# Patient Record
Sex: Male | Born: 1935 | Race: White | Hispanic: No | Marital: Married | Smoking: Never smoker
Health system: Southern US, Community
[De-identification: ages and names within clinical notes are randomized; demographics above are authoritative.]

## PROBLEM LIST (undated history)

## (undated) DIAGNOSIS — K922 Gastrointestinal hemorrhage, unspecified: Secondary | ICD-10-CM

## (undated) DIAGNOSIS — I639 Cerebral infarction, unspecified: Secondary | ICD-10-CM

## (undated) DIAGNOSIS — I10 Essential (primary) hypertension: Secondary | ICD-10-CM

## (undated) DIAGNOSIS — N400 Enlarged prostate without lower urinary tract symptoms: Secondary | ICD-10-CM

## (undated) DIAGNOSIS — I6529 Occlusion and stenosis of unspecified carotid artery: Secondary | ICD-10-CM

## (undated) DIAGNOSIS — R569 Unspecified convulsions: Secondary | ICD-10-CM

## (undated) DIAGNOSIS — I350 Nonrheumatic aortic (valve) stenosis: Secondary | ICD-10-CM

## (undated) DIAGNOSIS — T7840XA Allergy, unspecified, initial encounter: Secondary | ICD-10-CM

## (undated) DIAGNOSIS — G4733 Obstructive sleep apnea (adult) (pediatric): Secondary | ICD-10-CM

## (undated) DIAGNOSIS — I2510 Atherosclerotic heart disease of native coronary artery without angina pectoris: Secondary | ICD-10-CM

## (undated) DIAGNOSIS — G500 Trigeminal neuralgia: Secondary | ICD-10-CM

## (undated) DIAGNOSIS — N289 Disorder of kidney and ureter, unspecified: Secondary | ICD-10-CM

## (undated) DIAGNOSIS — H9190 Unspecified hearing loss, unspecified ear: Secondary | ICD-10-CM

## (undated) DIAGNOSIS — F329 Major depressive disorder, single episode, unspecified: Secondary | ICD-10-CM

## (undated) DIAGNOSIS — F32A Depression, unspecified: Secondary | ICD-10-CM

## (undated) DIAGNOSIS — I719 Aortic aneurysm of unspecified site, without rupture: Secondary | ICD-10-CM

## (undated) DIAGNOSIS — IMO0002 Reserved for concepts with insufficient information to code with codable children: Secondary | ICD-10-CM

## (undated) DIAGNOSIS — E785 Hyperlipidemia, unspecified: Secondary | ICD-10-CM

## (undated) HISTORY — DX: Allergy, unspecified, initial encounter: T78.40XA

---

## 1998-02-25 ENCOUNTER — Ambulatory Visit (HOSPITAL_COMMUNITY): Admission: RE | Admit: 1998-02-25 | Discharge: 1998-02-25 | Payer: Self-pay | Admitting: *Deleted

## 1998-04-22 ENCOUNTER — Ambulatory Visit (HOSPITAL_COMMUNITY): Admission: RE | Admit: 1998-04-22 | Discharge: 1998-04-22 | Payer: Self-pay

## 1998-11-26 ENCOUNTER — Observation Stay (HOSPITAL_COMMUNITY): Admission: AD | Admit: 1998-11-26 | Discharge: 1998-11-27 | Payer: Self-pay | Admitting: Cardiology

## 1999-04-10 ENCOUNTER — Ambulatory Visit (HOSPITAL_COMMUNITY): Admission: RE | Admit: 1999-04-10 | Discharge: 1999-04-10 | Payer: Self-pay

## 1999-04-29 ENCOUNTER — Encounter: Admission: RE | Admit: 1999-04-29 | Discharge: 1999-04-29 | Payer: Self-pay | Admitting: Internal Medicine

## 1999-04-29 ENCOUNTER — Encounter: Payer: Self-pay | Admitting: Internal Medicine

## 1999-09-22 ENCOUNTER — Other Ambulatory Visit: Admission: RE | Admit: 1999-09-22 | Discharge: 1999-09-22 | Payer: Self-pay | Admitting: Urology

## 1999-10-30 ENCOUNTER — Ambulatory Visit (HOSPITAL_COMMUNITY): Admission: RE | Admit: 1999-10-30 | Discharge: 1999-10-30 | Payer: Self-pay | Admitting: *Deleted

## 2000-01-10 ENCOUNTER — Ambulatory Visit (HOSPITAL_BASED_OUTPATIENT_CLINIC_OR_DEPARTMENT_OTHER): Admission: RE | Admit: 2000-01-10 | Discharge: 2000-01-10 | Payer: Self-pay | Admitting: Pulmonary Disease

## 2000-05-31 HISTORY — PX: CORONARY ARTERY BYPASS GRAFT: SHX141

## 2000-08-19 ENCOUNTER — Encounter: Payer: Self-pay | Admitting: Emergency Medicine

## 2000-08-19 ENCOUNTER — Inpatient Hospital Stay (HOSPITAL_COMMUNITY): Admission: EM | Admit: 2000-08-19 | Discharge: 2000-08-29 | Payer: Self-pay | Admitting: Emergency Medicine

## 2000-08-23 ENCOUNTER — Encounter: Payer: Self-pay | Admitting: Internal Medicine

## 2000-08-24 ENCOUNTER — Encounter: Payer: Self-pay | Admitting: Cardiothoracic Surgery

## 2000-08-25 ENCOUNTER — Encounter: Payer: Self-pay | Admitting: Cardiothoracic Surgery

## 2000-08-26 ENCOUNTER — Encounter: Payer: Self-pay | Admitting: Cardiothoracic Surgery

## 2000-08-27 ENCOUNTER — Encounter: Payer: Self-pay | Admitting: Cardiothoracic Surgery

## 2000-08-28 ENCOUNTER — Encounter: Payer: Self-pay | Admitting: Thoracic Surgery (Cardiothoracic Vascular Surgery)

## 2000-10-28 ENCOUNTER — Encounter (HOSPITAL_COMMUNITY): Admission: RE | Admit: 2000-10-28 | Discharge: 2001-01-26 | Payer: Self-pay | Admitting: Cardiovascular Disease

## 2000-11-26 ENCOUNTER — Inpatient Hospital Stay (HOSPITAL_COMMUNITY): Admission: EM | Admit: 2000-11-26 | Discharge: 2000-11-29 | Payer: Self-pay | Admitting: Emergency Medicine

## 2000-11-26 ENCOUNTER — Encounter: Payer: Self-pay | Admitting: *Deleted

## 2002-05-31 HISTORY — PX: TEMPORAL ARTERY BIOPSY / LIGATION: SUR132

## 2002-07-16 ENCOUNTER — Encounter: Payer: Self-pay | Admitting: Internal Medicine

## 2002-07-16 ENCOUNTER — Encounter: Admission: RE | Admit: 2002-07-16 | Discharge: 2002-07-16 | Payer: Self-pay | Admitting: Internal Medicine

## 2002-07-18 ENCOUNTER — Encounter: Admission: RE | Admit: 2002-07-18 | Discharge: 2002-07-18 | Payer: Self-pay | Admitting: Internal Medicine

## 2002-07-18 ENCOUNTER — Encounter: Payer: Self-pay | Admitting: Internal Medicine

## 2002-07-24 ENCOUNTER — Ambulatory Visit (HOSPITAL_COMMUNITY): Admission: RE | Admit: 2002-07-24 | Discharge: 2002-07-24 | Payer: Self-pay | Admitting: Internal Medicine

## 2002-07-24 ENCOUNTER — Encounter: Payer: Self-pay | Admitting: Internal Medicine

## 2002-08-13 ENCOUNTER — Ambulatory Visit (HOSPITAL_COMMUNITY): Admission: RE | Admit: 2002-08-13 | Discharge: 2002-08-13 | Payer: Self-pay | Admitting: Surgery

## 2002-08-13 ENCOUNTER — Encounter (INDEPENDENT_AMBULATORY_CARE_PROVIDER_SITE_OTHER): Payer: Self-pay | Admitting: Specialist

## 2004-04-11 ENCOUNTER — Inpatient Hospital Stay (HOSPITAL_COMMUNITY): Admission: RE | Admit: 2004-04-11 | Discharge: 2004-04-15 | Payer: Self-pay | Admitting: *Deleted

## 2005-02-08 ENCOUNTER — Observation Stay (HOSPITAL_COMMUNITY): Admission: EM | Admit: 2005-02-08 | Discharge: 2005-02-09 | Payer: Self-pay | Admitting: Emergency Medicine

## 2005-11-29 ENCOUNTER — Observation Stay (HOSPITAL_COMMUNITY): Admission: EM | Admit: 2005-11-29 | Discharge: 2005-11-30 | Payer: Self-pay | Admitting: Emergency Medicine

## 2005-11-30 ENCOUNTER — Encounter (INDEPENDENT_AMBULATORY_CARE_PROVIDER_SITE_OTHER): Payer: Self-pay | Admitting: Cardiology

## 2006-03-24 ENCOUNTER — Encounter: Payer: Self-pay | Admitting: Vascular Surgery

## 2006-03-24 ENCOUNTER — Ambulatory Visit (HOSPITAL_COMMUNITY): Admission: RE | Admit: 2006-03-24 | Discharge: 2006-03-24 | Payer: Self-pay | Admitting: Cardiology

## 2007-03-31 ENCOUNTER — Encounter: Admission: RE | Admit: 2007-03-31 | Discharge: 2007-03-31 | Payer: Self-pay | Admitting: Internal Medicine

## 2008-01-15 ENCOUNTER — Observation Stay (HOSPITAL_COMMUNITY): Admission: EM | Admit: 2008-01-15 | Discharge: 2008-01-16 | Payer: Self-pay | Admitting: Emergency Medicine

## 2008-01-29 ENCOUNTER — Encounter: Admission: RE | Admit: 2008-01-29 | Discharge: 2008-01-29 | Payer: Self-pay | Admitting: Orthopedic Surgery

## 2008-08-21 ENCOUNTER — Ambulatory Visit: Payer: Self-pay | Admitting: Vascular Surgery

## 2008-08-21 ENCOUNTER — Observation Stay (HOSPITAL_COMMUNITY): Admission: EM | Admit: 2008-08-21 | Discharge: 2008-08-21 | Payer: Self-pay | Admitting: *Deleted

## 2009-05-07 ENCOUNTER — Ambulatory Visit: Payer: Self-pay | Admitting: Vascular Surgery

## 2009-06-03 ENCOUNTER — Ambulatory Visit: Payer: Self-pay | Admitting: Vascular Surgery

## 2009-06-26 ENCOUNTER — Ambulatory Visit: Payer: Self-pay | Admitting: Vascular Surgery

## 2009-10-03 ENCOUNTER — Encounter: Admission: RE | Admit: 2009-10-03 | Discharge: 2009-10-03 | Payer: Self-pay | Admitting: Internal Medicine

## 2010-06-21 ENCOUNTER — Encounter: Payer: Self-pay | Admitting: Internal Medicine

## 2010-06-22 ENCOUNTER — Encounter: Payer: Self-pay | Admitting: Orthopedic Surgery

## 2010-09-10 LAB — CBC
Hemoglobin: 14.2 g/dL (ref 13.0–17.0)
MCHC: 33.8 g/dL (ref 30.0–36.0)
MCV: 92.8 fL (ref 78.0–100.0)
WBC: 7.6 10*3/uL (ref 4.0–10.5)

## 2010-09-10 LAB — BASIC METABOLIC PANEL
BUN: 13 mg/dL (ref 6–23)
CO2: 29 mEq/L (ref 19–32)
GFR calc Af Amer: >60 mL/min (ref >60)
GFR calc non Af Amer: 56 mL/min — ABNORMAL LOW (ref >60)
Glucose, Bld: 128 mg/dL — ABNORMAL HIGH (ref 70–99)

## 2010-09-10 LAB — DIFFERENTIAL
Basophils Relative: 0 % (ref 0–1)
Lymphs Abs: 1.1 10*3/uL (ref 0.7–4.0)

## 2010-09-10 LAB — URINALYSIS, ROUTINE W REFLEX MICROSCOPIC
Bilirubin Urine: NEGATIVE
Ketones, ur: NEGATIVE mg/dL
Nitrite: NEGATIVE
Specific Gravity, Urine: 1.019 (ref 1.005–1.030)

## 2010-09-10 LAB — RAPID URINE DRUG SCREEN, HOSP PERFORMED
Amphetamines: NOT DETECTED
Barbiturates: NOT DETECTED
Cocaine: NOT DETECTED
Opiates: NOT DETECTED
Tetrahydrocannabinol: NOT DETECTED

## 2010-09-10 LAB — CK TOTAL AND CKMB (NOT AT ARMC)
CK, MB: 3 ng/mL (ref 0.3–4.0)
Total CK: 105 U/L (ref 7–232)

## 2010-09-10 LAB — TROPONIN I: Troponin I: <0.01 ng/mL (ref 0.00–0.06)

## 2010-09-10 LAB — ETHANOL: Alcohol, Ethyl (B): 5 mg/dL (ref 0–10)

## 2010-10-13 NOTE — Discharge Summary (Signed)
NAME:  Larry Bradford, Larry NO.:  Bradford   MEDICAL RECORD NO.:  [id number]          PATIENT TYPE:  OBV   LOCATION:  5502                         FACILITY:  [facility]   PHYSICIAN:  Theodosia Paling, MD    DATE OF BIRTH:  11/09/1916   DATE OF ADMISSION:  01/15/2008  DATE OF DISCHARGE:  01/16/2008                               DISCHARGE SUMMARY   PRIMARY CARE PHYSICIAN:  Georgianne Fick, M.D., with [facility].   ADMITTING HISTORY:  Mr. Loewe is an 77 year old white male with a  history of CAD status post CABG in 2002, hypertension, dyslipidemia, and  was noncompliant with this medication who presents with generalized  tonic-clonic seizure at about 5:30 p.m. the night of admission that was  witnessed by his wife.  Seizure lasted for about 6 minutes, hit his head  on the wall.  He was foaming at mouth, did not lose bowel, or bladder  incontinence.  He had no recollection of events.  He last remembers the  indication when walking up to bathroom, the next thing he remembers is  waking up on the floor with paramedics attending to him, he feels just  weak with no complaint.  He had 3 other seizure activity in the past,  most recently 3 months ago, where he did not seek any medical attention.  He does have 1 on admission of September 2006, which is where he had his  first admission.   HOSPITAL COURSE:  Following issues were addressed during the hospital  course:  1. Seizure, recurrent seizure.  The patient underwent an MRI imaging      which did not reveal any acute intracranial event.  Dr. Buzzy Han      from Neurology was consulted who recommended to start the patient      on Keppra 250 b.i.d. and then follow up with Dr. Thad Ranger in 2      months' time.  He was instructed not to drive until seizure-free 6      months.  The patient did not have any recurrence of seizure      activity or any focal neurological deficits at the time of      discharge and  was feeling good.  2. Coronary artery disease:  The patient was continued on aspirin.      While he was admitted here, he did not have any CAD exacerbations.  3. Hypertension:  The patient has a questionable history of      hypertension; however, his blood pressure on admission was 137/70,      and his blood pressure today is 112/56, therefore, I am going to      question this diagnosis.  At that time, he needs to follow up Dr.      Nicholos Johns regarding his further blood pressure followup.   DISCHARGE DIAGNOSES:  1. Seizure, recurrent seizure disorder.  2. Coronary artery disease.  3. Questionable history of hypertension.  4. Hyperlipidemia, the patient's LDL cholesterol was 129.  If he has a      history of coronary artery disease, the target will be  less than      70.  At the time of discharge, I am sending him also home on      Simvastatin 40 mg p.o. daily.   DISCHARGE MEDICATIONS:  The patient is going home on,  1. Aspirin enteric-coated 81 mg p.o. daily.  2. Keppra 250 mg p.o. q.12 h.   DISPOSITION:  1. The patient will follow up with his primary care physician, Dr.      Carolyn Stare office in 1 week's time.  2. The patient will follow up with Dr. Thad Ranger from Neurology in 2      months' time.  3. The patient was instructed to not to drive for seizure-free 6      months at least.  4. The patient is to follow up with Dr. Nicholos Johns for further      evaluation and management of his hyperlipidemia.  5. The patient has been instructed to follow diet and exercise      regimen.   Total time was spent on discharge of this patient around an hour.      Theodosia Paling, MD  Electronically Signed     NP/MEDQ  D:  01/16/2008  T:  01/17/2008  Job:  [id number]   cc:   Georgianne Fick, M.D.  Michael L. Thad Ranger, M.D.

## 2010-10-13 NOTE — Procedures (Signed)
DUPLEX DEEP VENOUS EXAM - LOWER EXTREMITY   INDICATION:  Right lower extremity pain and swelling.   HISTORY:  Edema:  Yes.  Trauma/Surgery:  No.  Pain:  Yes.  PE:  No.  Previous DVT:  No.  Anticoagulants:  Other:    DUPLEX EXAM:                CFV   SFV   PopV  PTV    SSV                R  L  R  L  R  L  R   L  R  L  Thrombosis    o  o  o     o     o      +  Spontaneous   +  +  +     +     +      o  Phasic        +  +  +     +     +      o  Augmentation  +  +  +     +     +      o  Compressible  +  +  +     +     +      o  Competent     +  +  +     +     +      o   Legend:  + - yes  o - no  p - partial  D - decreased   IMPRESSION:  No evidence of deep venous thrombosis is identified.  Superficial vein thrombosis is identified in the right small saphenous  vein proximal to distal.     _____________________________  Janetta Hora. Fields, MD   CJ/MEDQ  D:  05/07/2009  T:  05/07/2009  Job:  [id number]

## 2010-10-13 NOTE — Consult Note (Signed)
VASCULAR SURGERY CONSULTATION   Larry Bradford, Larry Bradford  DOB:  09-17-1916                                       06/26/2009  ZOXWR#:[id number]   I saw the patient in the office today in consultation concerning his  peripheral vascular disease.  He is a pleasant 77 year old gentleman who  states that for 6 months he has noted that his legs are cold.  In  addition, he complains of having cold hands and feet.  He also describes  a 6 month history of right calf claudication which occurs at about a  quarter of a block.  His symptoms are brought on by ambulation and  relieved with rest.  There has been no significant change in the  character of the symptoms over the last 6 months.  He denies any history  of rest pain and has had no history of nonhealing ulcers.  The patient  was seen in early January by Dr. Charlsie Merles with an ingrown toenail and this  wound has been slow to heal but he feels that it is gradually improving.   PAST MEDICAL HISTORY:  Is significant for coronary artery disease.  He  underwent coronary revascularization in the past with vein taken from  the right leg.  He has had a previous myocardial infarction in the  remote past.  He denies any history of diabetes, hypertension,  hypercholesterolemia, history of congestive heart failure or history of  COPD.   FAMILY HISTORY:  There is no history of premature cardiovascular  disease.   SOCIAL HISTORY:  He is married.  He has three children.  He does not use  tobacco and has never used tobacco.   REVIEW OF SYSTEMS:  CARDIOVASCULAR:  He has had no chest pain, chest  pressure, palpitations or arrhythmias.  He does admit to dyspnea on  exertion.  He has had right calf claudication with no history of stroke,  TIAs or amaurosis fugax.  He has had no history of DVT or phlebitis.  MUSCULOSKELETAL:  He does have a history of arthritis.  GU:  He does have a history of urinary frequency and nocturia.  General, pulmonary, GI,  neurologic, psychiatric, ENT, hematologic and  integumentary review of systems is unremarkable and is documented on the  medical history form in his chart.   PHYSICAL EXAMINATION:  General:  This is a pleasant 77 year old  gentleman who appears his stated age.  Vital signs:  Blood pressure is  150/82, heart rate is 63, saturation 96%.  HEENT:  Pupils are equal,  round, reactive to light and accommodation.  Extraocular motions intact.  Conjunctivae are normal.  Neck:  Neck is supple.  There is no jugular  venous distention.  Lungs:  Clear bilaterally to auscultation without  rales, rhonchi or wheezing.  Cardiovascular:  I do not detect any  carotid bruits.  He has a regular rate and rhythm without murmur  appreciated.  He has no significant peripheral edema currently.  He has  palpable radial and femoral pulses with a palpable left popliteal pulse.  On the right side I cannot palpate a popliteal pulse or pedal pulses.  On the left side I cannot palpate pedal pulses.  Abdomen:  Soft and  nontender with normal pitched bowel sounds.  No masses are appreciated.  I cannot palpate an aneurysm.  Musculoskeletal:  There are no  major  deformities or cyanosis.  He has some slight discoloration that is  bluish discoloration at the corner of his right great toe where he had  the ingrown toenail.  Neurological:  He has no focal weakness or  paresthesias.  Skin:  There are no ulcers or rashes.   I have reviewed his arterial Doppler study which was done on 06/03/2009  and this shows monophasic Doppler signals in both feet with an ABI of  52% on the right and 68% on the left.  I have also reviewed his previous  venous Doppler which showed no evidence of DVT.   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  On the left side he has mostly tibial  occlusive disease.  I have explained that as long as the wound is  gradually healing I would favor  continued conservative treatment.  If  the wound progressed and this became a limb threatening situation we  would have to pursue arteriography to consider his options for  revascularization.  Certainly he would be at increased risk for surgery  given his age.  In addition, options for revascularization would be  limited on the right as he has had previous vein taken from the right  leg.  Given that he likely has diffuse infrainguinal arterial occlusive  disease it is less likely that he would have disease amenable to  angioplasty on the right.  However, if the wound progresses I would  recommend arteriography.   I plan on seeing him back in 6 months and I have ordered followup ABIs  at that time.  In the meantime he will continue to follow with Dr.  Charlsie Merles.  The patient can call or Dr. Charlsie Merles can call if there is any  change in his toe and he needs to be evaluated sooner.  Currently there  is no evidence of infection to suggest a need for antibiotics.     Di Kindle. Edilia Bo, M.D.  Electronically Signed  CSD/MEDQ  D:  06/26/2009  T:  06/27/2009  Job:  [id number]   cc:   Lenn Sink, D.P.M.

## 2010-10-13 NOTE — H&P (Signed)
NAME:  Larry Bradford, HUGE NO.:  [id number]   MEDICAL RECORD NO.:  [id number]          PATIENT TYPE:  OBV   LOCATION:  5502                         FACILITY:  [facility]   PHYSICIAN:  Peggye Pitt, M.D. DATE OF BIRTH:  02-07-1917   DATE OF ADMISSION:  01/15/2008  DATE OF DISCHARGE:                              HISTORY & PHYSICAL   PATIENT'S PRIMARY CARE Kendryck Lacroix:  Georgianne Fick, MD with [facility].   CHIEF COMPLAINT:  Seizure activity and weakness.   HISTORY OF PRESENT ILLNESS:  Mr. Brumbach is an 77 year old white man  with history of coronary artery disease status post CABG in 2002,  hypertension, and dyslipidemia, who was noncompliant with his  medications, who presents with a generalized tonic-clonic seizure at  about 5:30 p.m. the night of admission that was witnessed by his wife.  Seizure lasted for about 6 minutes, hit his head on the wall, he was  foaming at the mouth, did not lose bowel or bladder continence.  He has  no recollection of events.  He last remembers being in the kitchen and  walking up to go to the bathroom and next remembers waking up on the  floor with the paramedics over him.  He currently feels just generally  weak with no other complaints.  He has had 3 other seizure activities in  the past, most recently 3 months ago, and at that time, he did not seek  medical attention.  He does have an admission in September 2006, which  is when he first had a seizure.   ALLERGIES:  He has no known drug allergies.   PAST MEDICAL HISTORY:  Coronary artery disease status post CABG in 2002,  hypertension, and dyslipidemia, and noncompliance with medications.   MEDICATIONS:  He only takes a baby aspirin 81 mg daily.   SOCIAL HISTORY:  He is married and lives in [place].  He is retired.  He denies any alcohol, smoking, or illicit drug use.   FAMILY HISTORY:  Noncontributory at his age.   REVIEW OF SYSTEMS:  A 10-system  review of systems is negative except as  per HPI.   PHYSICAL EXAMINATION:  VITAL SIGNS:  Blood pressure 137/70, heart rate  78, respirations 24, O2 sats 95% on 2 L with a temperature of 97.4.  GENERAL:  He was alert, awake, oriented x3 in no acute distress, just  felt generally weak.  HEENT:  Normocephalic.  He does have some scratches over his right  temporally located scalp as well as over his left lip.  His pupils are  equally reactive to light and accommodation with intact extraocular  movements.  NECK:  Supple with no lymphadenopathy, no bruits, or goiter.  CARDIOVASCULAR:  Regular rate and rhythm with no murmurs, rubs, or  gallops.  LUNGS:  Clear to auscultation bilaterally.  ABDOMEN:  Soft, nontender, nondistended with positive bowel sounds.  EXTREMITIES:  No edema with positive pedal pulses.  NEUROLOGICAL:  His mental status was intact.  His cranial nerves II  through XII were intact.  His deep tendon reflexes were 2+ symmetric.  His muscular  strength was 5/5 bilateral upper extremities and about 4/5  mostly I believe effort dependent over his bilateral lower extremities.  His sensation was intact to light touch.  His finger-to-nose was normal.  His Babinski was downgoing.   LABORATORY DATA UPON ADMISSION:  Sodium of 140, potassium 4.4, chloride  104, bicarb 29, BUN 16, creatinine 1.2, for an anion gap of 6, and a  glucose of 113.  WBC is 9.0, hemoglobin 14.6, and platelets are 193.  PT  13.4, INR 1.0, PTT of 29.  His first set of point-of-care markers was  negative.  He also had a CT head in the emergency department with no  acute findings.  He had an EKG that showed normal sinus rhythm at a rate  of about 72 with a right bundle-branch block.  Other than that, no ST-T  wave changes.   ASSESSMENT AND PLAN:  1. Seizures, recurrent.  We will check an MRI, we will check an EEG.      I have consulted Dr. Pearlean Brownie with Neurology.  He will see the patient      in the morning.  He  has recommended that we start Dilantin p.o.,      which I will start at 100 mg b.i.d.  I will also start seizure      precautions and have him on Ativan 2 mg IV p.r.n. for seizure      activity.  2. For his hyperlipidemia, we will check a fasting lipid panel in the      morning and treat appropriately.  3. For his hypertension for now, we will monitor his blood pressure      off medication.  4. For his coronary artery disease, we will continue his aspirin.  5. For his deep vein thrombosis prophylaxis, we will do subcutaneous      heparin and for his GI prophylaxis p.o. Protonix.      Peggye Pitt, M.D.  Electronically Signed     EH/MEDQ  D:  01/15/2008  T:  01/16/2008  Job:  [id number]

## 2010-10-13 NOTE — Consult Note (Signed)
NAME:  Larry Bradford, Larry Bradford NO.:  [id number]   MEDICAL RECORD NO.:  [id number]          PATIENT TYPE:  OBV   LOCATION:  5502                         FACILITY:  [facility]   PHYSICIAN:  Levert Feinstein, MD          DATE OF BIRTH:  [birth year]   DATE OF CONSULTATION:  01/16/2008  DATE OF DISCHARGE:  01/16/2008                                 CONSULTATION   CHIEF COMPLAINT:  Seizure.   HISTORY OF PRESENT ILLNESS:  The patient is an 77 year old gentleman who  was admitted by [facility] for recurrent seizure.   He has past medical history of coronary artery disease, status post CABG  in 2002, hypertension, dyslipidemia, but not compliant with his  medication per H&P, presenting with general tonic-clonic seizure at 3:30  p.m.   The patient is alone at the bed side, so the history is from reviewing  the note and talking with the patient, and per record, the episode was  witnessed by his wife.  The patient was at the kitchen table at 3:30  yesterday afternoon, January 15, 2008.  He stood up, ready to go into the  bathroom, who could not recall any details from then on.  He woke up on  the bathroom floor, paramedics was rounding him, and got ready to take  him to the [facility].  By that time, he was taken to the emergency  room, his mentation gradually came back.  He could not recall any  warning signs, but per H&P, he had a seizure, general tonic-clonic, hit  his head on the wall, lasted about 6 minutes, foamy at the mouth, but  there was no urinary or bowel incontinence.   Reviewing the chart, the initial seizure was on February 09, 2007, and  he was evaluated by my partner, Dr. Sandria Manly.  In that particular episode,  he had a seizure waking up from sleep, and lasted about 15-20 minutes.  There was no recurrent seizure since that initial event per the patient,  but he was admitted in the Stroke Service in July 2007, by Dr. Jodi Mourning  for diplopia.   REVIEW OF SYSTEMS:  He denies  chest pain and visual deficit, lateralized  motor or sensory deficit.   PAST MEDICAL HISTORY:  1. Hypertension.  2. Coronary artery disease.  3. Hyperlipidemia.  4. Noncompliance with medication.   PAST SURGICAL HISTORY:  CABG in 2002.   SOCIAL HISTORY:  He is married, lives in [place].  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   CURRENT MEDICATIONS:  Only taking baby aspirin 81 mg every day.   FAMILY HISTORY:  Noncontributory.   CURRENT PHYSICAL EXAMINATION:  VITAL SIGNS:  Temperature is 98.1, heart  rate is 62-70, blood pressure 112-160/56-70.  CARDIAC:  Regular rate and rhythm.  PULMONARY:  Clear to auscultation bilaterally.  NECK:  Supple.  No carotid bruits.  NEUROLOGIC:  He is alert and oriented to time and place.  Cranial nerves  II through XII, status post bilateral cataract extraction and lens  implants.  There is anisocoria, right pupil is 2 and the left is 3 mm  and minimal reactive to light.  Visual fields were intact.  Facial  sensation and strength was normal.  Uvula and tongue midline.  He is  hard-of-hearing.  Head turning was normal and symmetric.  MUSCULOSKELETAL:  Motor:  Normal tone, bulk, and strength.  Sensory:  Normal to light touch and temperature.  Deep tendon reflexes:  Hypoactive and symmetric.  Plantar responses were flexor.  Coordination:  No dysmetria.  Gait:  Mildly cautious gait and no dysmetria.   MRI of the brain revealed, which has demonstrated diffuse atrophy, but  mild subcortical white matter disease in the brain stem and hemisphere,  but there was no acute lesion.   Chest x-ray with mild cardiac enlargement, low lung volume with vascular  clotting and streaky.   LABORATORY EVALUATION:  Elevated LDL of 129.  CMP is normal other than  the mildly decreased albumin.  Alcohol level is less than 5.   ASSESSMENT  AND PLAN:  An 77 year old gentleman, presenting with second  recurrent seizure, and general tonic-clonic.  1. Since recurrent seizure, he should be taking antiseptic medication.      He was loaded with Dilantin last night, but I think Keppra would be      a better choice in this age group.  We have 250 mg b.i.d.  2. He is to follow up with [facility] in 2-3 weeks, we will      be titrating up the Keppra dose then.  3. For complete epilepsy workup, he should also have EEG, but this can      be arranged as an outpatient.  4. Please control stroke risk factor.  Goal LDL less than 100 and      blood pressure less than 130/80.      Levert Feinstein, MD  Electronically Signed     YY/MEDQ  D:  01/16/2008  T:  01/17/2008  Job:  [id number]

## 2010-10-16 NOTE — Discharge Summary (Signed)
[place]  Patient:    Larry Bradford, Larry Bradford Visit Number: [id number] MRN: [id number]          Service Type: MED Location: [phone] Attending Physician:  Darlin Priestly Dictated by:   Halford Decamp Delanna Ahmadi, R.N., N.P. Admit Date:  11/26/2000 Discharge Date: 11/29/2000                             Discharge Summary  HISTORY OF PRESENT ILLNESS:  Mr. Betti Cruz is an 77 year old white male with a prior medical history of hypertension and hyperlipidemia, and a prior history of CAD with coronary artery bypass grafting on August 24, 2000, with LIMA to his LAD, SVG to his left circumflex, and SVG to his RCA.  He apparently had an episode of chest pain, taken one nitroglycerin with relief and then had left arm pain.  HOSPITAL COURSE:  He was seen by Lenise Herald, M.D., admitted and put on IV heparin.  CK-MBs came back negative and it was decided that he should undergo cardiac catheterization.  This was performed on November 28, 2000, by Madaline Savage, M.D.  All grafts were patent.  Potential sources of angina were OM2 which trifurcated and was not bypassed ostial lesion of 90% to all three branches.  He had a proximal diagonal which was also unbypassed and diffusely diseased.  His EF was 60%.  Medical therapy was planned.  He was seen again by Dr. Elsie Lincoln on November 29, 2000.  He was thought to be ready to for discharge. He had mild bruise of his groin and he suggested increasing his atenolol doses.  LABORATORY DATA:  Hemoglobin 11.5, hematocrit 32.9, platelets 236.  Sodium was 139, potassium 4.1, BUN 15, creatinine 1.1.  CK-MBs and troponins were negative.  His total cholesterol was 152, triglycerides 81, HDL was 42, and LDL was 94.  Chest x-ray showed cardiomegaly and bibasilar segmental atelectasis.  There is no discharge summary sheet at the time of this dictation.  DISCHARGE MEDICATIONS: 1. Aspirin 325 mg everyday. 2. Tenormin 25 mg everyday. 3. Altace  1.25 mg b.i.d. 4. Plavix 75 mg everyday. 5. Zocor 28 mg everyday. 6. Isosorbide monotrate 30 mg everyday. 7. Colace everyday. 8. Nitroglycerin p.r.n.  DISCHARGE DIAGNOSES: 1. Angina. 2. Coronary artery disease, status post catheterization this admission,    previously with percutaneous transluminal intervention of his left    circumflex in June of 2000, repeat catheterization in March of 2002 with    subsequent coronary artery bypass grafting August 24, 2000, with left    internal mammary artery to his left anterior descending and saphenuos vein    graft to his left circumflex and saphenuos vein graft to his right coronary    artery. Catheterization on November 28, 2000, which showed all grafts patent,    however, obtuse marginal 2 had an ostial lesion of 90%, but this was not    bypassed and a proximal diagonal which also was unbypassed had diffuse    disease. 3. Normal ejection fraction of 60%. 4. Hyperlipidemia. Dictated by:   Halford Decamp Delanna Ahmadi, R.N., N.P. Attending Physician:  Darlin Priestly DD:  12/29/00 TD:  12/29/00 Job: [id number]

## 2010-10-16 NOTE — H&P (Signed)
[place]  Patient:    Larry Bradford, Larry Bradford                       MRN: [id number] Adm. Date:  08/19/00 Attending:  Jamey Reas, M.D. Dictator:   Tarri Fuller, P.A.                         History and Physical  Dictated by Tarri Fuller, P. A. for Dr. Lendell Caprice.  CHIEF COMPLAINT:  Chest pain.  HISTORY OF PRESENT ILLNESS:  Larry Bradford is a 77 year old male who presents with complaint of 24 hour history of left sided chest pain radiating into the left shoulder.  The pain started yesterday morning without any injury.  The pain has been generally worse with activity and relieved with rest.  He also took some aspirin.  He denies associated nausea, vomiting, shortness of breath, diaphoresis, weakness, palpitations, or syncope.  He does have occasional reflux which is currently not bothering him.  He has no dysphagia, abdominal pain, or swelling.  No edema or orthopnea.  No fever, chills, or cough.  He has a history of coronary artery disease and had stenting of a 99% lesion in his circumflex artery in June of 2000 by Dr. Aleen Campi.  He also had an abdominal aortic aneurism.  He is not very compliant with his cholesterol medication, Pravachol and frequently misses doses.  His most recent cholesterol total was 229, and his LDL was 150 in February of this year.  He does not smoke and has no history of former smoking.  No use of alcohol. Family history is negative for coronary disease.  He did have a Persantine Cardiolite stress test just this past August 2001 which showed no evidence of perfusion.  Ejection fraction was 54%.  He did not reach his target heart rate.  Patient does have nitroglycerin but rarely uses it and does not specifically have any angina.  He does admit to having some increased exertional fatigue and shortness of breath recently which was his initial presenting symptoms for coronary disease two years ago.  He has also  recently been under evaluation for dyspnea on exertion and had pulmonary function testing.  He did have a sleep study which confirmed sleep apnea, although he is not using his CPAP machine.  REVIEW OF SYSTEMS:  Patient does have herniated disk with some left lower extremity weakness.  He does complain of recent fatigue.  He has occasional heartburn. He believes some of his medication causes headaches, therefore this explains his noncompliance.  Review of systems otherwise is negative.  MEDICATIONS: 1. Atenolol 50 mg daily. 2. Diovan 80 mg daily. 3. Natural Tears when needed. 4. Aspirin 81 mg daily. 5. Fish oil q.d. 6. Pravachol 20 mg q.d.  ALLERGIES:  NO KNOWN DRUG ALLERGIES.  PAST MEDICAL HISTORY:  BPH, elevated PSA, hyperlipidemia, impotency, history of rectal bleeding, coronary artery disease status post stent June 2000, hypertension, fatigue, dyspnea with exertion, prostatism, insomnia, and obesity.  PAST PROCEDURES:  Catheterization with PTCA and stent in June 2000, Persantine Cardiolite August 2001, negative.  EGD in 1999 and colonoscopy which showed polyp and hemorrhoids.  CT of the pelvis in 1999 was negative.  MRI of the lumbar spine November 2000 showed L4-5 disk herniation.  Pulmonary function test June 2001 showed mild restrictive pulmonary disease.  FAMILY HISTORY:  Negative for diabetes, heart disease, cancer, hypertension.  SOCIAL HISTORY:  Patient currently lives  with his spouse.  He no longer is working.  He denies history of alcohol or tobacco use.  He continues to drive.  PHYSICAL EXAMINATION:  GENERAL:  Age 77. Pleasant, short, well-dressed, well-developed, well-nourished, overweight Caucasian male in no acute distress, reliable historian but slightly hard of hearing.  VITAL SIGNS:  Weight 194 which is stable, blood pressure 130/60 seated, pulse regular, respirations unlabored and regular.  HEENT:  Within normal limits.  Mucous membranes are moist  without redness.  NECK:  Supple without lymphadenopathy, thyromegaly, bruits, or JVD.  He does have a small buffalo hump.  CARDIAC:  Regular rate and rhythm without murmur, gallop, or rub.  There is no chest wall tenderness or rash.  CHEST:  Clear to auscultation bilaterally.  BACK:  He has some excoriations on the right side of his back.  ABDOMEN: Obese, protuberant, soft, and nondistended without visible pulsatile mass.  EXTREMITIES: Warm and dry without edema or rash.  Excoriation on the back as noted above.  Gait is stable.  GENITOURINARY AND RECTAL:  Exam are deferred, not pertinent to present illness.  NEURO:  Affect is alert and oriented, slightly hard of hearing.  LABORATORY DATA: EKG done in the office shows some nonspecific changes compared to his last EKG in August 2001 with chronic T wave inversion in the lateral precordial leads V4-V6.  No acute ST segment changes or Q wave.  ASSESSMENT:  1. Chest pain, rule out myocardial infarction. 2. Coronary artery disease. 3. Hypertension. 4. Abdominal aortic aneurism. 5. Obesity. 6. Hyperlipidemia. 7. Noncompliance.  PLAN:  Patient admitted to [facility] telemetry from the emergency department until  bed is available.  Dr. Allyson Sabal from [facility] will be consulted to consider catheterization today or possibly on Monday.  Schedule serial cardiac enzymes, troponin level, CBC, metabolic panel, PT/PTT.  Obtain chest x-ray and daily EKG.  Will continue all other current home medications and add as needed oral nitroglycerin order.  Will not start heparin or nitroglycerin drip at this time.  Patient will be admitted to Dr. Johnsie Kindred. Routine vitals and daily weight.  Normal activity. DD:  08/19/00 TD:  08/20/00 Job: [id number]

## 2010-10-16 NOTE — H&P (Signed)
NAME:  Larry Bradford, Larry Bradford NO.:  [id number]   MEDICAL RECORD NO.:  [id number]          PATIENT TYPE:  EMS   LOCATION:  MAJO                         FACILITY:  [facility]   PHYSICIAN:  Mallory Shirk, MD     DATE OF BIRTH:  11-11-1916   DATE OF ADMISSION:  02/08/2005  DATE OF DISCHARGE:                                HISTORY & PHYSICAL   CHIEF COMPLAINT:  Seizure, new onset.   HISTORY OF PRESENT ILLNESS:  Larry Bradford is a very pleasant 77 year old  Caucasian gentleman with a history of coronary artery disease status post  three vessel bypass in March 2002, hypertension, BPH, hyperlipidemia who was  in his usual state of health yesterday, February 07, 2005.  He went to  church as usual, ate dinner at [facility] and went to bed at his usual  time.  Earlier this morning patient woke up and was randomly flinging his  arms around.  Patient's wife tried to talk to him but he appeared to be in a  trance.  She also noted some saliva drooping down the left side of his  mouth.  Episode lasted about 15-20 minutes.  Patient's wife called EMS.  EMS  told her that he was appearing postictal and was brought to the [facility]  Emergency Department for evaluation.   At the time of evaluation patient is alert and oriented x3, in no acute  distress.  He does not remember the incident other than the wife describing  it; however, does not have any after effects of this episode.  He has no  other complaints at this time except tenderness in his right calf.  Of note,  patient has had this problem before.  Per daughter at bedside patient has  had problems with blood clots, etiology unclear.  Details not available.   PAST MEDICAL HISTORY:  1.  Coronary artery disease status post CABG in 2002.  2.  Hypertension.  3.  Benign prostatic hypertrophy.  4.  A 3.2 cm abdominal aortic aneurysm.  5.  Gallstones.  6.  Tonsillectomy.  7.  Status post temporal artery biopsy in 2004.   MEDICATIONS  ON ADMISSION:  1.  Aspirin 81 mg p.o. daily.  2.  Lipitor 40 mg p.o. daily.  3.  Avodart 0.5 mg p.o. daily.   ALLERGIES:  Patient has allergies to CETIRIZINE.   SOCIAL HISTORY:  Patient lives with his wife.  Independent with ADLs.  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  No alcohol, tobacco,  or drug use.   FAMILY HISTORY:  Noncontributory.   REVIEW OF SYSTEMS:  Greater than 10 systems reviewed.  Other than HPI and  right calf tenderness, negative.   PHYSICAL EXAMINATION:  GENERAL APPEARANCE:  Very pleasant Caucasian  gentleman looking younger than his stated age of 62 lying in bed in no acute  distress.  Alert and oriented x3.  Wife and daughter at bedside.  HEENT:  Normocephalic, atraumatic.  PERRL.  Sclerae anicteric.  Mucous  membranes moist.  NECK:  Supple.  No LAD.  No  JVD.  LUNGS:  Clear to auscultation bilaterally.  No wheezes.  No rales.  CARDIOVASCULAR:  S1 plus S2.  Regular rate and rhythm.  2/6 systolic  ejection murmur.  EXTREMITIES:  No cyanosis, clubbing.  Mild calf tenderness in the right  calf.  NEUROLOGIC:  Cranial nerves II-XII grossly intact.  Sensory, motor within  normal limits.  Strength 5/5 symmetrical all four extremities.  No focal  deficits seen.   CT of the head:  Chronic microvascular white matter disease.  Chronic left  brain stem infarct, but no acute intracranial process seen.  EKG:  Normal  sinus rhythm.  Right bundle branch block,  seen in EKG of March of 2004.   LABORATORIES:  WBC 13, hemoglobin 15, hematocrit 43.7, MCV 89.7, platelets  227.  Sodium 137, potassium 4, chloride 102, carbon dioxide 27, glucose 126,  BUN 17, creatinine 1.2.  Total bilirubin 1.8, alkaline phosphatase 65, AST  22, ALT 20, calcium 9.1.  Urinalysis negative.   ASSESSMENT/PLAN:  An 77 year old Caucasian gentleman with a history of  coronary artery disease hyperlipidemia, hypertension, benign prostatic  hypertrophy brought in with new onset  seizure-like activity.  1.  Seizure, new onset.  Dr. Avie Echevaria, neurology, has been consulted.      Patient has been admitted to telemetry bed with seizure precautions.  2.  Hyperlipidemia.  We have resumed patient's Lipitor at 40 mg p.o. daily.  3.  Hypertension.  Patient is not on any antihypertensives at the present      time.  Blood pressure was 134/72.  We will continue to monitor his blood      pressure.  4.  Benign prostatic hypertrophy.  His Avodart has been started at home      doses.  5.  Prophylaxis.  Patient is on Lovenox 40 mg subcutaneous daily for deep      venous thrombosis prophylaxis, Protonix 40 mg p.o. daily for      gastrointestinal prophylaxis.  6.  Right calf pain.  We will check a venous Doppler of the lower      extremities for possible deep venous thrombosis.   DISPOSITION:  After evaluation by neurology and resolution of acute symptoms  patient will be discharged home with follow-up with primary care physician.      Mallory Shirk, MD  Electronically Signed     GDK/MEDQ  D:  02/08/2005  T:  02/08/2005  Job:  [id number]   cc:   Georgianne Fick, M.D.  [address]  [place]  [place] [address]  Fax: [phone]   Genene Churn. Love, M.D.  [address]  [place]  [place] [address]  Fax: [phone]   Antionette Char, MD  [address]  [place]  [place] [address]  Fax: [phone]   Angelia Mould. Derrell Lolling, M.D.  [address]  [place]  [place] [address]   Maretta Bees. Vonita Moss, M.D.  [address]  [place]  [place] [address]  Fax: [phone]

## 2010-10-16 NOTE — Discharge Summary (Signed)
NAME:  Larry Bradford, SMOLEN NO.:  [id number]   MEDICAL RECORD NO.:  [id number]          PATIENT TYPE:  INP   LOCATION:  0362                         [facility]ACILITY:  [facility]   PHYSICIAN:  Mobolaji B. Bakare, M.D.DATE OF BIRTH:  09/02/1916   DATE OF ADMISSION:  04/11/2004  DATE OF DISCHARGE:  04/15/2004                                 DISCHARGE SUMMARY   PRIMARY CARE PHYSICIAN:  Georgianne Fick, M.D.   CARDIOLOGIST:  Antionette Char, M.D.   SURGEON:  Angelia Mould. Derrell Lolling, M.D.   FINAL DIAGNOSES:  1.  Acute sigmoid diverticulitis.  2.  Benign prostatic hypertrophy.  3.  Coronary artery disease with abnormal EKG.  4.  Triple abdominal aortic aneurysm.  5.  Hypertension.  6.  Hyperbilirubinemia, clearly Gilbert's syndrome.  7.  Hypercholesterolemia.  8.  Gallstones with chief complaint of left-sided abdominal pain.   HISTORY OF PRESENT ILLNESS:  Mr. Messimer is an 77 year old Caucasian male  with history of CAD.  He presented with acute onset of left-sided abdominal  pain associated with anorexia.  There was no nausea, vomiting, diarrhea or  fever. He was found to have an elevated white cell count of 13.3 and CT scan  of the abdomen showed mild sigmoid diverticulitis, enlarged prostate,  presence of gallstones and a 3.2 cm abdominal aortic aneurysm.   The patient is obese but was not in any respiratory distress.  Main finding  was on abdominal examination.  Abdomen was obese, soft with tenderness in  left lower quadrant.  Some rebound tenderness.  Bowel sounds were  hypoactive.  Other systems were essentially within normal limits.   LABORATORY DATA:  White cell count 13.3, hematocrit 43.1, MCV 91, platelets  210; differential 85% neutrophils.  Amylase was 42, normal.  Lipase 36,  normal.  Sodium 134, potassium 3.9, chloride 99, CO2 29, glucose 121, BUN  11, creatinine 1.0.  Bilirubin 2.3, alkaline phosphatase 55, AST 18, ALT 23,  total protein 7.2, albumin 3.4, calcium  9.1.  UA was within normal limits.  Cardiac enzymes were normal.  CTPAC 8.11.  Fasting lipid profile showed  total cholesterol 165, triglycerides 102, cholesterol HDL 37, LDL 108.  Fecal occult blood was negative.   HOSPITAL COURSE:  PROBLEM #1 -  ACUTE SIGMOID DIVERTICULITIS:  The patient  was started on antibiotics, ciprofloxacin and Flagyl.  Surgeon was called to  evaluate in view of the rebound tenderness and this was felt to be an  uncomplicated acute sigmoid diverticulitis.  The patient was improved on  antibiotics.  He remained afebrile and white cell count improved.  Diet was  gradually advanced and at the moment the patient is tolerating a low residue  diet.   PROBLEM #2 -  BENIGN PROSTATIC HYPERTROPHY:  The patient had increased  frequency of micturition at small quantity.  Attempt at putting in a Foley  catheter was successful and the patient was started on Flomax and he has an  appointment to follow up with Dr. Vonita Moss in the office after discharge.   PROBLEM #3 -  CORONARY ARTERY DISEASE:  The patient showed an abnormal  EKG  with ST segment depression V3 to V6 and I and aVF; however, old EKG was also  compatible with the current EKG.  The patient ruled out for MI with three  negative troponins and with no abnormal reading on telemetry.   PROBLEM #4 -  GALLSTONES:  This was asymptomatic.   PROBLEM #5 -  HYPERBILIRUBINEMIA:  At admission bilirubin was 2.3.  This  gradually improved to normal.  This possibly could be Gilbert's syndrome.  There was no obstruction of the common bile duct on CT scan.  Ultrasound of  the bowel sounds showed cholelithiasis in the neck of the gallbladder which  appeared nonmobile.  Fatty infiltration common bile duct 6.8 mm.   PROBLEM #6 -  HYPERCHOLESTEROLEMIA:  The patient's lipid profile showed  mildly elevated LDL, however, he decided to go off Zocor by himself.  The  patient was encouraged to discuss his further options with Dr.  Nicholos Johns.   DISCHARGE INSTRUCTIONS:  The patient is to follow up with Dr. Derrell Lolling in two  weeks and Dr. Vonita Moss, phone number [phone], call for appointment  regarding BPH and follow up with Dr. Nicholos Johns in one to two weeks' time.   DISCHARGE MEDICATIONS:  1.  Citrucel one scoop daily.  2.  Aspirin 81 mg p.o. daily.  3.  Atenolol 50 mg once a day.  4.  Ciprofloxacin 500 mg p.o. b.i.d. to complete 14 days.  5.  Flagyl 500 mg t.i.d. to complete 14 days.  6.  Flomax 0.4 mg once a day.  7.  Plendil 2.5 mg once a day.  8.  Tylenol 650 mg as needed.   DIET:  Low residue diet.   ACTIVITY:  As tolerated.   RECOMMENDATIONS:  Barium enema as an outpatient in six weeks' time.  This  will be followed up at Dr. Derrell Lolling.      MBB/MEDQ  D:  04/15/2004  T:  04/16/2004  Job:  [id number]   cc:   Georgianne Fick, M.D.  [address]  [place]  [place] [address]  Fax: [phone]   Antionette Char, MD  [address]  [place]  [place] [address]  Fax: [phone]   Angelia Mould. Derrell Lolling, M.D.  [address]  [place]  [place] [address]  Fax: [phone]   Maretta Bees. Vonita Moss, M.D.  [address]  [place]  [place] [address]  Fax: [phone]

## 2010-10-16 NOTE — H&P (Signed)
NAME:  Larry Bradford, Larry Bradford NO.:  [id number]   MEDICAL RECORD NO.:  [id number]          PATIENT TYPE:  INP   LOCATION:  0450                         [facility]ACILITY:  [facility]   PHYSICIAN:  Marcie Mowers, M.D.DATE OF BIRTH:  08/15/1916   DATE OF ADMISSION:  04/11/2004  DATE OF DISCHARGE:                                HISTORY & PHYSICAL   PRIMARY CARE PHYSICIAN:  Georgianne Fick, M.D.   CHIEF COMPLAINT:  Left-sided abdominal pain since last afternoon.   HISTORY OF PRESENT ILLNESS:  This is an 77 year old Caucasian male with past  medical history of CABG, hypertension, hyperlipidemia, was in his usual  state of health until yesterday around 1:30 p.m. Then he suddenly felt pain  in his left lower abdomen, and the severity was about 7 to 8/10, and it was  persistent.  He could not sleep through the night because of pain.  He felt  no appetite and has not had anything p.o. since the pain started including  any medications, food, water, etc.  This morning the patient went to Urgent  Care, had blood drawn there, and was sent for CT scan of the abdomen to  [facility], which showed presence of sigmoid diverticulitis.  Upon  discussion with the Urgent Care physician, it was reported that patient also  had a white count of about 14.  The patient came to [facility] as  a direct admit.  The patient denies any nausea, vomiting, diarrhea, fevers,  chills, any blood in stools.  Denies any difficulty urinating.  Denies  dysuria or urgency. Denies any chest pain or shortness of breath.  The  patient states that he had colonoscopy two to three years ago which was  normal.   PAST MEDICAL HISTORY:  1.  CABG in March 2002 due to three-vessel disease.  2.  Hypertension.  3.  Hypercholesterolemia.  4.  Recent history of having persistent right left tightness/soreness which      was increased with walking.  He is scheduled to have a test done on      Thursday,  April 16, 2004, for the right lower extremity.   PAST SURGICAL HISTORY:  1.  Temporal artery biopsy done in 2004, and it showed only atherosclerosis,      no inflammation.  2.  Tonsillectomy.   MEDICATIONS:  1.  Aspirin 81 mg daily.  2.  Atenolol 50 mg daily.   He was taking Plendil, Zocor which he stopped about one year ago because  they were making him feel weak in his legs.  He did not report this to his  primary care physician.   ALLERGIES:  CETRIZINE.   SOCIAL HISTORY:  The patient lives with his wife.  He runs a real estate  business, and he does his own activities of daily living and instrumental  activities of daily living.  He denies any use of alcohol or tobacco.   FAMILY HISTORY:  Noncontributory at this point.   REVIEW OF SYSTEMS:  As mentioned in History of Present Illness, the patient  denies any diarrhea, nausea, vomiting, hematochezia or  melena, denies any  difficulty urinating, dysuria, urgency, frequency.  His last bowel movement  was on the morning of the day of admission, and it was his normal bowel  movement.  He had positive flatulence.  He denies any chest pain or  shortness of breath.   PHYSICAL EXAMINATION:  GENERAL:  The patient is alert, awake, does not  appear to be in any distress.  He is very pleasant.  VITAL SIGNS: Temperature 97.7 degrees, blood pressure 138/66, heart rate 76  per minute, respirations 16 per minute.  He is saturating at 92% on room  air.  His weight is 185 pounds.  HEENT:  Head is atraumatic and normocephalic. Extraocular muscles are  intact. Pupils equal, round, and reactive to light.  Sclerae without  icterus.  NECK:  There is no JVD, no carotid bruits, no thyromegaly, no  lymphadenopathy.  CARDIOVASCULAR:  S1 and S2 are normal.  There is presence of a systolic  ejection murmur which is about 2/6 in intensity at this present at base.  It  is nonradiating.  No S3 or S4 appreciated.  PULMONARY:  Clear to auscultation  bilaterally, no crackles or wheezes.  ABDOMEN:  It is diffusely tender.  There is presence of voluntary guarding,  no rigidity.  Bowel sounds are hypoactive diffusely.  Deep palpation not  performed secondary to tenderness of the abdomen.  EXTREMITIES:  There is no clubbing, cyanosis, or edema.  NEUROLOGIC:  He is alert and oriented x 3.  Cranial nerves II-XII intact.  No focal deficits.  Overall unremarkable neurological exam.   LABORATORY AND X-RAY DATA:  The only labs available today are BUN 12,  creatinine 1.2.   CT scan of abdomen showed mild sigmoid diverticulitis, enlarged prostate,  presence of gallstones, presence of a 3.2 cm AAA.   IMPRESSION AND PLAN:  1.  Abdominal pain.  This is most likely secondary to sigmoid      diverticulitis.  Also to consider as possibility is bowel ischemia,      although it is less likely given that he does not have any blood in      stools and it has been more than 24 hours since onset of symptoms.  He      also has significant findings on abdominal exam which are consistent      with inflammation.  Will start him on ciprofloxacin IV and Flagyl IV.      Will start on Dilaudid for pain control p.r.n. .  Will keep n.p.o.      except medications.  Will get further workup including stool for occult      blood, complete metabolic panel, amylase, lipase, urinalysis, CBC with      differential.  Further management depends on his hospital course, but      overall, he will most likely need a gastroenterology evaluation and      possibly a colonoscopy once he is more stable from his pain.  Will also      start him on IV Protonix 40 mg daily.  2.  HYPERTENSION.  At this point, blood pressure is well controlled. Will      resume his home medications which include atenolol and Plendil.  3.  CORONARY ARTERY DISEASE.  It is stable.  Will continue his medication      which include atenolol and aspirin.  Will get a 12-lead EKG. 4.  Hypercholesterolemia.   Reportedly, Zocor made him feel week and,  therefore, he has not been taking Zocor for about a year now.  Will hold      Zocor for now.  He will need a fasting lipid profile to be done.  5.  Prophylaxis.  Will start IV Protonix.  The patient can ambulate as      tolerated.  SCD when in bed.      PMJ/MEDQ  D:  04/11/2004  T:  04/11/2004  Job:  [id number]   cc:   Georgianne Fick, M.D.  [address]  [place]  [place] [address]  Fax: [phone]

## 2010-10-16 NOTE — H&P (Signed)
NAME:  Larry Bradford, Larry Bradford NO.:  [id number]   MEDICAL RECORD NO.:  [id number]          PATIENT TYPE:  INP   LOCATION:  1824                         FACILITY:  [facility]   PHYSICIAN:  Michael L. Reynolds, M.D.DATE OF BIRTH:  July 14, 1916   DATE OF ADMISSION:  11/29/2005  DATE OF DISCHARGE:                                HISTORY & PHYSICAL   REASON FOR ADMISSION:  Diplopia and vertigo.   HISTORY OF PRESENT ILLNESS:  This is the initial stroke service admission  for this 77 year old man with a past medical history which includes  hypertension, hypercholesterolemia, and a single seizure back in September  of last year at which time he was seen by Dr. Avie Echevaria but for which he is  not on chronic anticonvulsants. The patient says that two days prior to  admission, he did not rest very well but then does not recall anything  specific that was going on. He did state that yesterday while in church he  notes the sudden onset of diplopia which came and went over the course of  the remainder of the day. He describes the diplopia as horizontal and says  that it generally is worse when he looks off to his left. He says he never  had anything like this before. This morning upon awakening he thought the  diplopia was a little bit worse and he also reported having a sensation of  vertigo as if the room was spinning to the left as if he felt a little bit  dizzy. He does not feel particularly unsteady on his feet. He denies any  associated slurred speech, any numbness or tingling or weakness in the  extremities or any difficulty with walking. He came to the emergency room  for evaluation. He denies a history of similar events. He denies any  associated headache, chest pain, shortness of breath, nausea, vomiting or  loss of consciousness.   PAST MEDICAL HISTORY:  He has a history of hypertension and  hypercholesterolemia which is followed by Dr. Nicholos Johns. He has a history  of a single  stroke in September of last year as noted above, he saw Dr. Sandria Manly  at that time. He had a negative MRI and EEG and was briefly admitted. He has  a history of prostatic hypertrophy, a 3 cm abdominal aortic aneurysm and a  history of gallstones. He had a temporal artery biopsy in 2004. He has a  history of coronary artery disease status post CABG in 2002.  His wife says  that he has obstructive sleep apnea.   FAMILY HISTORY:  Noncontributory.   SOCIAL HISTORY:  He lives with his wife and is independent in activities of  daily living. He does not smoke.   ALLERGIES:  CETIRIZINE.   MEDICATIONS:  None. He states that he has been prescribed atenolol and  Lipitor as well as aspirin in the past but he has discontinued these over  the past few months claiming that they made his stomach hurt.   REVIEW OF SYSTEMS:  A full 10-system review of systems is negative except as  outlined in  the HPI and in the accompanying emergency room and admission  nursing records.   PHYSICAL EXAMINATION:  VITAL SIGNS:  Temperature 96.5, blood pressure  162/79, pulse 55, respirations 20.  GENERAL:  This is a healthy-appearing man in no evident distress.  HEENT:  Head, cranium is normocephalic and atraumatic, oropharynx benign.  NECK:  Supple without carotid or supraclavicular bruits.  HEART:  Regular rate and rhythm without murmurs.  CHEST:  Clear to auscultation bilaterally.  ABDOMEN:  Obese, soft, normal active bowel sounds.  EXTREMITIES:  No edema and diminished pulses.  NEUROLOGIC:  Mental status: He is awake, alert, fully oriented to time,  place and person. Recent and remote memory are intact. Speech is fluent, not  dysarthric. Mood is euthymic and affect appropriate. Funduscopic exam is  benign. Left pupil is slightly larger than the right but both are round and  briskly reactive. Examination of extraocular movements revealed left sixth  nerve palsy. Visual fields full to confrontation. Hearing is intact  to  conversational speech. Facial sensation is intact to pinprick. Face, tongue  and palate move normally and symmetrically. Shoulder shrug strength is  normal. Motor testing, normal bulk and tone, normal strength in all tested  extremity muscles. Sensation intact to light touch, vibration and pinprick.  Coordination and rapid movement are performed a little slowly on the left.  Finger-to-nose and heel-to-shin are performed adequately. Gait, he arises  from a chair easily and stance is normal. He is able to ambulate without  difficulty but has difficulty tandem walking. Reflex is 2+ and symmetric,  toes are downgoing bilaterally.   LABORATORY DATA:  CBC, white count 6.9, hemoglobin 16.1, platelets 212,000.  BMET is remarkable for BUN and creatinine of 14 and 1.2 respectively. Normal  glucose of 99. Slightly elevated potassium of 5.2, coags are normal.   CT of the head demonstrates atrophy and small vessel disease without acute  finding. EKG demonstrates right bundle branch block without acute finding.   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 Risk factors include hypertension, hypercholesterolemia  and obstructive sleep apnea.   PLAN:  Will admit for a brief stroke workup including MRI, MRA, carotid and  transcranial Dopplers, 2-D echocardiogram. Will resume his aspirin and other  medications that he was on prior to hospital admission. If all his testing  is negative, we will anticipate discharge tomorrow.      Michael L. Thad Ranger, M.D.  Electronically Signed     MLR/MEDQ  D:  11/29/2005  T:  11/29/2005  Job:  [id number]   cc:   Georgianne Fick, M.D.  Fax: [phone]

## 2010-10-16 NOTE — Op Note (Signed)
   NAME:  Larry Bradford, Larry Bradford NO.:  [id number]   MEDICAL RECORD NO.:  [id number]                   PATIENT TYPE:  OIB   LOCATION:  2899                                 FACILITY:  [facility]   PHYSICIAN:  Abigail Miyamoto, M.D.              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   PROCEDURE:  Right temporal artery biopsy.   SURGEON:  Abigail Miyamoto, M.D.   ANESTHESIA:  Lidocaine 1% with monitored anesthesia care.   ESTIMATED BLOOD LOSS:  Minimal.   DESCRIPTION OF PROCEDURE:  The patient was brought to the operating room and  identified as Larry Bradford.  He was prone on the operating room table,  and then anesthesia was induced.  His right temporal area was then prepped  and draped in the usual sterile fashion.  Lidocaine 1% was then used to  anesthetize the area just anterior to the ear.  A small transverse incision  was made just in front of the right ear with the #15 blade scalpel.  The  incision was carried down through the subcutaneous tissue with  electrocautery.  The temporal artery was then identified and dissected out  with the Metzenbaum scissors.  The artery was then controlled proximally and  distally with hemostats, and then transected with the scalpel.  A specimen  of artery was then sent to pathology for identification.  The two ends were  then closed with #3-0 silk ties.  The subcutaneous layers were closed with  interrupted #3-0 Vicryl sutures, and the skin was closed with running #4-0  Vicryl.  Steri-Strips were then applied.  The patient tolerated the procedure well.  All sponge, needle, and  instrument counts were correct at the end of the procedure.  The patient was  then taken in stable condition from the operating room to the  recovery room.                                                Abigail Miyamoto, M.D.    DB/MEDQ  D:  08/13/2002  T:  08/13/2002  Job:  [id number]   cc:   Jaynie Bream, M.D.

## 2010-10-16 NOTE — Cardiovascular Report (Signed)
. [facility]  Patient:    Larry Bradford, Larry Bradford                       MRN: [id number] Proc. Date: 11/28/00 Adm. Date:  [id number] Attending:  Lenise Herald H CC:         Lennette Bihari, M.D.  Cath Lab   Cardiac Catheterization  PROCEDURE: 1. Selective coronary angiography by Judkins technique. 2. Retrograde left heart catheterization. 3. Left ventricular angiography. 4. Selective visualization of multiple saphenuos vein grafts. 5. Selective visualization of the left internal mammary artery graft.  ENTRY SITE:  Right femoral artery.  DYE USED:  Omnipaque.  MEDICATIONS GIVEN:  None.  The patient tolerated the procedure very well without complications.  INDICATIONS:  The patient is an 77 year old gentleman who underwent coronary artery bypass grafting on August 24, 2000, with a vein graft to the circumflex OM, vein graft to the right coronary artery, internal mammary artery graft to the mid LAD.  He was admitted on November 26, 2000, with chest pain and had negative cardiac enzymes.  He states that the pain that he had been having was very similar to what he had prior to coronary artery bypass grafting.  Todays procedure was performed on an inpatient basis electively.  RESULTS:  PRESSURES:  The left ventricular pressure was 135/18, central aortic pressure 135/60 with a mean of 85.  No aortic valve gradient by pullback technique.  ANGIOGRAPHIC RESULTS:  The left main coronary artery was about 30% stenosed at the ostium.  The vessel was very large in caliber, perhaps 4.5 mm in diameter.  The left anterior descending coronary artery receives an internal mammary artery graft about 2/3 of the way down its course.  There is competitive flow, retrograde into the IMA.  There is a high grade stenosis in the LAD proximally approaching 75%.  There is also an unbypassed diagonal branch arising before the septal perforator branch.  This vessel was diffusely  diseased.  It is small and it represents a potential source of angina.  The circumflex coronary artery is a relatively large vessel that has a stent in its proximal portion.  The distal vessel is very large and fills with Timi 3 flow.  There is an obtuse marginal branch #1 which is supplied in its midsegment by a patent vein graft.  The right coronary artery is a diffusely diseased vessel, high grade stenosis in the midportion.  The mid RCA receives a patent saphenuos vein graft.  The vein graft itself contains some irregularities in the proximal portion of the vessel up to 30% stenosed, but this vessel fills equally from antegrade flow through the diseased RCA as well as through the vein from the aortocoronary bypass graft.  The internal mammary artery graft to the LAD is patent throughout its course and no lesions are seen.  It supplies a small distal LAD.  The left ventricle contracts normally.  No wall motion abnormalities detected.  Ejection fraction 60%.  FINAL DIAGNOSES: 1. Multivessel native coronary artery disease as described above. 2. Patent grafts to right coronary artery and obtuse marginal branch #1. 3. Patent left internal mammary artery graft to left anterior descending. 4. Potential sources of continuing angina in unbypassed vessels include the    diagonal #1 and there was also an obtuse marginal branch #2 that    trifurcated distally and had ostial disease as it came off the distal    circumflex.  PLAN:  Continued  medical therapy of coronary disease and stable angina. DD:  11/28/00 TD:  11/28/00 Job: [id number]

## 2010-10-16 NOTE — Discharge Summary (Signed)
NAME:  Larry Bradford, Larry Bradford NO.:  [id number]   MEDICAL RECORD NO.:  [id number]          PATIENT TYPE:  INP   LOCATION:  4705                         FACILITY:  [facility]   PHYSICIAN:  Mallory Shirk, MD     DATE OF BIRTH:  01/08/1917   DATE OF ADMISSION:  02/08/2005  DATE OF DISCHARGE:                                 DISCHARGE SUMMARY   DISCHARGE DIAGNOSES:  1.  Seizure, new onset.  2.  Hyperlipidemia.  3.  Benign prostatic hypertrophy.  4.  Right calf pain.  5.  Hypertension.   MEDICATIONS ON DISCHARGE:  1.  Aspirin 81 mg p.o. daily.  2.  Lipitor 40 mg p.o. daily.  3.  Avodart 0.5 mg p.o. daily.   Please note, there have been no changes in the patient's outpatient regimen.   FOLLOWUP APPOINTMENTS:  1.  With Dr. Georgianne Fick next week; the patient already has an      appointment.  2.  With Dr. Avie Echevaria of neurology; the patient will call to make an      appointment.   HISTORY OF PRESENT ILLNESS:  Larry Bradford is a very pleasant, 77 year old,  Caucasian gentleman who was in his usual state of health on February 07, 2005 and then went to bed, woke up at about five-thirty in the morning with  activity that was described seizure-like by his wife.  He was randomly  flinging his arms around, trying to reach for things that were not there.  The patient's wife also noticed some saliva dripping down on his left side.  She tried to get his attention by talking to him, but he was staring away.  She called EMS.  When EMS personnel came, she was told he looked postictal  and was brought to the emergency department of [facility] on February 08, 2005 for further evaluation.   At the time of evaluation, the patient was alert and oriented times three,  did not remember the incident with the wife describing it, however had no  after effects of this episode, no other complaints except tenderness in his  right calf.  Of note, the patient has had this problem before and  per  daughter at bedside has had history of blood clots with unclear etiology,  details of this not being available.   PAST MEDICAL HISTORY:  Significant for:  1.  Coronary artery disease, status post three-vessel CABG in 2002.  2.  Hypertension.  3.  BPH.  4.  A 3.2-cm abdominal aortic aneurysm.  5.  Gallstones.  6.  Tonsillectomy.  7.  Temporal artery biopsy in 2004.   MEDICATIONS ON ADMISSION:  1.  Aspirin 81 mg p.o. daily.  2.  Lipitor 40 mg p.o. daily.  3.  Avodart 0.5 mg p.o. daily.   ALLERGIES:  THE PATIENT HAS ALLERGY TO CETIRIZINE.   PHYSICAL EXAMINATION ON ADMISSION:  A very pleasant, Caucasian gentleman  looking younger than his stated age of 6, lying in bed in no acute  distress, alert and oriented times three.  Wife and daughter at bedside.  HEENT:  Normocephalic, atraumatic; PERRL; sclerae anicteric; mucous  membranes moist; neck supple; no LAD, no JVD.  LUNGS:  Clear to auscultation bilaterally, no wheezes, no rales.  CARDIOVASCULAR SYSTEM:  S1 S2, regular rate and rhythm.  A 2/6 systolic  ejection murmur.  EXTREMITIES:  No cyanosis, clubbing or edema.  Mild calf tenderness in the  right calf.  NEUROLOGIC EXAM:  Cranial nerves II through XII grossly intact.  Sensory,  motor within normal limits.  Strength 5/5.  Babinski downgoing bilaterally.  No focal deficits seen.   IMAGING:  1.  CT of the head:  Chronic microvascular white matter disease, chronic      left brainstem infarct with no acute intracranial process.  2.  EKG:  Normal sinus rhythm, right bundle branch block seen in EKG of      March 2004.   LABORATORIES ON ADMISSION:  WBC 13, hemoglobin 15, hematocrit 43.7, MCV  89,7, platelets 227.  Sodium 137, potassium 4, chloride 102, carbon dioxide  27, glucose 126, BUN 17, creatinine 1.2, total bilirubin 1.8, alkaline  phosphatase 65, AST 22, ALT 20, calcium 9.1.  Urinalysis negative.   HOSPITAL COURSE:  The patient was admitted to a monitored bed.  1.   Seizure.  By definition this is a new-onset seizure.  Dr. Avie Echevaria of      neurology was consulted and evaluated the patient.  An EEG was done      which was essentially normal.  TSH and B12 levels were normal.  RPR was      nonreactive.  No further workup is being planned at this time.  The      patient is being discharged on no antiepileptic medications.  Dr. Sandria Manly      will be following him on an outpatient basis.  The patient will be      calling for an appointment.  There was no seizure or seizure-like events      during the patient's hospital stay.  On day of discharge, the patient      was ambulating independently without difficulty.  There were no events      on the monitor either.  2.  Hyperlipidemia.  The patient is on Lipitor.  His lipid profile was as      follows:  Cholesterol 120, triglycerides 83, HDL 45, LDL 58.  His      Lipitor has been continued without change.  3.  BPH.  The patient is on Avodart.  4.  Leg pain.  Venous doppler studies of bilateral lower extremities was      negative for any DVT.  5.  Hypertension.  The patient's blood pressure was stable during the      hospital stay.  Of note, he is on no antihypertensive at the present      time.  He does not take atenolol and Norvasc which have been listed on a      prior discharge summary.  Further management of hypertension deferred to      primary care physician.   DISPOSITION:  The patient was discharged in stable condition.  On the day of  discharge, his blood pressure was 131/61, pulse 69, respirations 18,  temperature 98.3 with saturations of 94% on room air.  He was able to  independently ambulate in the halls without difficulty.  His family will  take him home.  He was advised to take his medications as prescribed, keep  his followup appointments and return to the emergency department  immediately  upon onset of chest pain, seizures, dizziness or any other condition that might need immediate medical  attention.      Mallory Shirk, MD  Electronically Signed     GDK/MEDQ  D:  02/09/2005  T:  02/09/2005  Job:  [id number]   cc:   Genene Churn. Love, M.D.  [address]  [address]  [place]  [place] [address]  Fax: [phone]   Georgianne Fick, M.D.  [address]  [place]  [place] [address]  Fax: [phone]

## 2010-10-16 NOTE — Discharge Summary (Signed)
[facility]  Patient:    Larry Bradford, Larry Bradford                       MRN: [id number] Adm. Date:  [id number] Disc. Date: [id number] Attending:  Kerin Perna Iii Dictator:   Adair Patter, P.A. CC:         Runell Gess, M.D.   Discharge Summary  DATE OF BIRTH:  01-Dec-1916.  ADMISSION DIAGNOSIS:  Chest pain.  SECONDARY DIAGNOSES: 1. Hypertension. 2. Hypercholesterolemia.  DISCHARGE DIAGNOSIS:  Coronary artery disease.  PROCEDURES PERFORMED: 1. Cardiac catheterization. 2. Upper extremity Doppler exam. 3. Bilateral carotid Duplex scan. 4. Coronary artery bypass grafting x 3.  HOSPITAL COURSE:  Larry Bradford was admitted to [facility] on August 19, 2000 secondary to experiencing chest pain. Because of this, he underwent extensive cardiac workup which included cardiac catheterization. His catheterization revealed significant three vessel coronary artery disease. Because of this Dr. Kathlee Nations Trigt III was consulted for surgical intervention.  The patient subsequently underwent a coronary artery bypass graft x 3 on August 24, 2000 with the left internal mammary artery anastomosed to the left anterior descending artery, saphenous vein graft to the circumflex and a saphenous vein graft to the right coronary artery. The procedure was done by Dr. Morton Peters with no complications.  Postoperatively, the patient was requiring some inotropic support with low dose Dopamine. This was weaned off appropriately throughout his postoperative course. Postoperatively, the patient had an uneventful hospital course. He was subsequently discharged home on August 29, 2000.  DISCHARGE MEDICATIONS: 1. Tylox 1-2 tablets p.o. q.4-6h. p.r.n. pain. 2. Aspirin 325 mg one tablet p.o.q.d. 3. Atenolol 25 mg one tablet p.o.q.d. 4. Zocor 10 mg one p.o.q.d.  DISCHARGE ACTIVITY:  The patient is told to avoid driving, strenuous activity and lifting objects over 10  pounds.  DISCHARGE DIET:  Low-fat, low-salt diet.  WOUND CARE:  The patient was instructed on cleaning his incision with soap and water.  DISPOSITION:  Home.  FOLLOW-UP:  The patient was told to call his cardiologist, Dr. Nanetta Batty for an appointment in two weeks. He was told to bring chest x-ray to Dr. Aletha Halim office. He is also instructed to follow-up with Dr. Morton Peters in his office in three weeks. He was told to report to the [facility] office for staple removal in approximately ten days. [facility] office will call to verify the exact time and date of these appointments. DD:  08/28/00 TD:  08/29/00 Job: [id number]

## 2010-10-16 NOTE — Consult Note (Signed)
[place]. [facility]  Patient:    [name], [name]                       MRN: [id number] Proc. Date: 08/22/00 Adm. Date:  [id number] Attending:  Berry, Jonathan [place] CC:         Lennette Bihari, M.D.  Janae Bridgeman. Eloise Harman., M.D.  [facility]  CVTS Office  [facility]   Consultation Report  REASON FOR CONSULTATION:  Left main and three-vessel coronary artery disease.  PHYSICIAN REQUESTING CONSULTATION:  Lennette Bihari, M.D.  PRIMARY CARE PHYSICIAN:  Janae Bridgeman. Eloise Harman., M.D.  CHIEF COMPLAINT:  Chest pain.  HISTORY OF PRESENT ILLNESS:  I was asked to see Larry Bradford in consultation by Lennette Bihari, M.D., for evaluation and treatment of left main stenosis and three-vessel coronary artery disease.  The patient is 77 years old with known history of coronary artery disease, status post placement of a proximal circumflex stent in June of 2000 by Aram Candela. Aleen Campi, M.D.  He did fairly well since then.  He had a stress Cardiolite done last summer which apparently was normal with an ejection fraction of 54%.  He has had some gradual progressive dyspnea on exertion over the past several weeks.  He presented to the hospital with acute onset of left-sided chest pain three days ago.  This was described as a squeezing pain radiating into the left shoulder and was not precipitated by injury or any unusual motion.  The pain was somewhat improved with rest. He had no associated nausea or vomiting, but had been short of breath.  He presented to his medical doctor and was admitted for rule out MI.  EKG showed nonspecific ST segment changes.  Cardiac enzymes were drawn and his CPK-MB was minimally elevated at approximately 2-3 ng/ml.  He was stabilized on IV heparin.  He underwent cardiac catheterization today by Lennette Bihari, M.D. This demonstrated 80% ostial left main stenosis with 80% proximal LAD stenosis, 80-90%  stenosis of the ramus intermedius, a patent stent to the circumflex which supplied his posterolateral, and a 70% stenosis of the proximal codominant right.  His overall ejection fraction was approximately 50%.  He had a small infrarenal abdominal aneurysm.  He has had no chest pain since the procedure.  Due to his surgical type anatomy, left main stenosis, and severe three-vessel disease, he was referred for surgical coronary revascularization.  PAST MEDICAL HISTORY: 1. Hypertension. 2. Obesity. 3. Sleep apnea.  He was evaluated by Danice Goltz, M.D., and was given a    BIPAP machine, but he cannot use that at home since it interferes with his    sleep and he does not use it. 4. Bilateral cataracts. 5. Hyperlipidemia. 6. BPH with nocturia three to five times each night.  CURRENT MEDICATIONS: 1. Tenormin 50 mg p.o. q.d. 2. Aspirin 81 mg q.d. 3. Pravachol 20 mg p.o. q.d.  ALLERGIES:  None.  SOCIAL HISTORY:  The patient lives with his wife, Johnny Bridge.  He is retired.  He is still active around the house and drives.  He denies ever smoking or using alcohol.  He is also supported by his daughter, Arline Asp.  FAMILY HISTORY:  Negative for diabetes, premature coronary disease, or hypertension.  REVIEW OF SYSTEMS:  The patient denies any recent change in weight.  Denies any recent pulmonary infections or productive cough.  He denies any history of significant trauma, fractures, or rib  fractures.  He is right-hand dominant. He denies any symptoms of TIA or CVA.  He denies any history of DVT or claudication.  He does have cramps at night in his legs, which are relieved by walking.  He denies any problems with a bleeding disorder or easy bruisability.  He denies any difficulty swallowing or dysphagia other than some difficulty with pills, but with no other solid foods.  Otherwise the review of systems is negative.  PHYSICAL EXAMINATION:  He is 5 feet 6-1/2 inches and weighs 180  pounds.  VITAL SIGNS:  The blood pressure is 130/64.  The heart rate is 76 per minute. Respirations 18.  GENERAL APPEARANCE:  A pleasant, elderly, white male in his hospital bed at bed rest following cardiac catheterization.  He is in no distress.  HEENT:  Normocephalic.  Fair dentition.  The pharynx is clear.  EOMs normal.  NECK:  Without JVD, thyromegaly, mass, or carotid bruit.  LYMPHATICS:  No palpable adenopathy in the cervical, supraclavicular, or axillary regions.  CHEST:  Clear with chest deformity.  CARDIAC:  Regular rate and rhythm without S3 gallop or murmur.  ABDOMEN:  Obese.  There is no organomegaly, pulsatile mass palpable, or tenderness.  There are no abdominal bruits.  He has a compression dressing in the right groin at the catheterization site.  EXTREMITIES:  Without edema, swollen, tender joints, or clubbing.  VASCULAR:  2+ pedal pulses bilaterally and 2+ radial pulses bilaterally. There is no venous insufficiency of the lower extremities.  SKIN:  Warm and dry.  There is some eczema-type rash on the medial tibial areas bilaterally.  NEUROLOGIC:  Alert and oriented x 3 with full motor function.  He is restricted to bed rest.  RECTAL:  Exam was deferred.  I reviewed his coronary arteriograms performed by Lennette Bihari, M.D., and agreed with the interpretation of significant left main and three-vessel disease.  His symptoms of unstable angina are well correlated to his coronary anatomy.  I agree that coronary bypass surgery will be the best long-term option to preserve his ventricular function, relieve his angina, and improve his long-term survival.  I discussed these expected benefits of bypass surgery  with the patient and his family, as well as the major aspects of the operation, including the choice of conduit, the location of the surgical incisions, the use of general anesthesia and cardiopulmonary bypass, and expected hospital recovery period.  I  reviewed the risks associated with this operation as well, including the risks of MI, CVA, bleeding, infection, and death.  He understands that at age 67 his risks for surgery would be slightly higher than normal, but still less than 10%.  He understands that he is at high risk for myocardial infarction or a fatal cardiac event with his left main stenosis and three-vessel disease.  He agrees to proceed with the operation as recommended.  We will schedule his surgery for Wednesday, August 24, 2000.  Thank you very much for this consult. DD:  08/22/00 TD:  08/22/00 Job: [id number]

## 2010-10-16 NOTE — Consult Note (Signed)
NAME:  Larry Bradford, KNOTTS NO.:  [id number]   MEDICAL RECORD NO.:  [id number]          PATIENT TYPE:  INP   LOCATION:  0450                         FACILITY[facility]  [facility]   PHYSICIAN:  Angelia Mould. Derrell Lolling, M.D.DATE OF BIRTH:  [birth year]   DATE OF CONSULTATION:  04/12/2004  DATE OF DISCHARGE:                                   CONSULTATION   REASON FOR CONSULTATION:  Evaluate diverticulitis.   HISTORY OF PRESENT ILLNESS:  This is an 77 year old white man who has not  had any history of any colitis in the past.  Forty-eight hours ago, he  developed somewhat sudden onset of left lower quadrant and left flank pain.  This persisted.  He may have had a little bit of diarrhea, but did not see  any blood in his stools.  He denied any nausea or vomiting.  He has urinary  frequency, which is chronic, but really no change in his voiding pattern.  He has not had any prior episodes of this.  He was admitted to [facility] yesterday when a CT scan showed uncomplicated sigmoid  diverticulitis.  The CT scan also showed gallstones and a 3.2 cm abdominal  aortic aneurysm.   I was called by the hospitalist service today because they were concerned  about how tender he was.  I have seen the patient and he states that it does  hurt him in one specific place in the left side, but he is otherwise doing  well.   PAST HISTORY:  1.  Coronary artery disease, status post coronary artery bypass grafting.  2.  Hypertension.  3.  Benign prostatic hypertrophy.  4.  A 3.2 cm abdominal aortic aneurysm by CT.  5.  Gallstones by CT.  6.  History of tonsillectomy.  7.  Status post temporal artery biopsy showing atherosclerosis.  8.  Colonoscopy two to three years ago by Jonny Ruiz C. Madilyn Fireman, M.D., reportedly      normal.   CURRENT MEDICATIONS:  At home he takes aspirin 81 mg a day and atenolol 50  mg a day.  He was taking Plendil and Zocor, but stopped one year ago because  of fatigue in his legs.  He  is currently taking intravenous Cipro and  intravenous Flagyl.   ALLERGIES:  CEFATRIZINE.   SOCIAL HISTORY:  He lives with his wife.  He does his own activities of  daily living independently.  Runs a real estate business.  Denies use of  alcohol or tobacco.   FAMILY HISTORY:  Noncontributory.   REVIEW OF SYSTEMS:  The 15-system review of systems was performed and is  noncontributory, except as described above.   PHYSICAL EXAMINATION:  GENERAL APPEARANCE:  A pleasant Caucasian male in no  acute distress.  He is alert.  He is obese.  He is short statured.  VITAL SIGNS:  Temperature 97.8 degrees, blood pressure 133/56, heart rate 66  and respiratory rate 18.  HEENT:  Eyes:  Sclerae clear.  Extraocular movements intact.  NECK:  Supple.  No mass.  No jugular venous distention.  HEART:  Regular rate and  rhythm.  Grade 2/6 systolic murmur.  CHEST:  Well-healed sternotomy scar.  No chest wall tenderness.  LUNGS:  Clear to auscultation.  ABDOMEN:  Somewhat obese.  The abdomen is soft with hypoactive bowel sounds.  He is tender in the left flank.  This is very focal around the level of the  anterior superior iliac spine.  He is not tender in the suprapubic area.  He  is soft and nontender elsewhere.  There is no palpable mass.  There is no  hernia.  EXTREMITIES:  He moves all four extremities well without pain or deformity.  NEUROLOGIC:  No gross motor or sensory deficits.   ADMISSION DATA:  CT scan does show extensive sigmoid diverticulosis, but the  focal inflammatory changes are up higher near the junction of the proximal  sigmoid colon with the descending sigmoid colon.  There are no air bubbles  or free air.  There is no fluid or abscess.  There are no signs of  obstruction.  He does have some small gallstones.   Ultrasound confirms gallstones.   The white blood cell count on admission was 13,300 and has gone down to  11,100 today.  The total bilirubin is 2.3, but the rest of his  liver enzymes  are normal.  The EKG shows a right bundle branch block and depressed ST  segments laterally.   ASSESSMENT:  1.  Acute diverticulitis, first episode, no apparent complications.  2.  Gallstones, asymptomatic.  3.  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  4.  Coronary artery disease, status post coronary artery bypass grafting.      Question of lateral ischemia raised on the EKG.  5.  Hypertension.  6.  Significant benign prostatic hypertrophy.  7.  Asymptomatic 3.2 cm abdominal aortic aneurysm by CT.   PLAN:  At this point I think that his diverticulitis is uncomplicated and  should be treated medically.  Bowel rest is advised with a clear liquid diet  only.  Intravenous Cipro and Flagyl at full doses have already begun and is  an appropriate choice.  I will follow along with you.  Once this episode has  resolved, he probably should have a barium enema in about six weeks.     Hayw   HMI/MEDQ  D:  04/12/2004  T:  04/12/2004  Job:  [id number]   cc:   Georgianne Fick, M.D.  [address]  [place]  [place] [address]  Fax: [phone]

## 2010-10-16 NOTE — Procedures (Signed)
EEG NUMBER:  [id number].   CLINICAL INFORMATION:  This patient had a generalized major motor seizure  today and CT scans has shown evidence of bi-cerebral small vessel ischemic  strokes.   DIAGNOSTICS:  This EEG was recorded during the awake state.  The background  activity shows 9- to 10-Hz rhythms with higher amplitudes seen in the  posterior head regions bilaterally.  No focal asymmetry, stage II sleep or  epileptiform activity was seen.  Hyperventilation testing and photic  stimulation were not performed.   IMPRESSION:  This is a normal EEG during the awake state.           ______________________________  Genene Churn. Sandria Manly, M.D.     [id number]  D:  02/08/2005 15:44:55  T:  02/09/2005 06:11:51  Job #:  [id number]

## 2010-10-16 NOTE — Op Note (Signed)
[place]. [facility]  Patient:    Larry Bradford, Larry Bradford                       MRN: [id number] Proc. Date: 08/24/00 Adm. Date:  [id number] Attending:  Mikey Bussing CC:         Lennette Bihari, M.D., [facility]  Lilia Pro, M.D.   Operative Report  PREOPERATIVE DIAGNOSIS:  Class IV unstable angina with severe three-vessel coronary disease.  POSTOPERATIVE DIAGNOSIS:  Class IV unstable angina with severe three-vessel coronary disease.  PROCEDURE:  Coronary artery bypass grafting x 3 (left internal mammary artery to the left anterior descending coronary artery, saphenous vein graft to ramus intermedius, saphenous vein graft to right coronary artery).  SURGEON:  Mikey Bussing, M.D.  ASSISTANT:  Rande Brunt. Thomasena Edis, M.D.  ANESTHESIA:  General.  INDICATIONS:  The patient is an 77 year old obese white male who presented to the cardiology service with acute-onset chest pain and ruled out for MI. Cardiac catheterization by Dr. Tresa Endo demonstrated severe three-vessel coronary disease with left main stenosis.  The patient has a history of a circumflex stent being placed in the circumflex vessel by Dr. Aleen Campi.  This was patent. His overall ejection fraction was fairly well-preserved, and he was referred for surgical coronary revascularization due to his left main stenosis and high-grade stenosis of the LAD and right coronary arteries.  Prior to the operation, I examined the patient in his hospital room and reviewed the results of the recent catheterization with the patient and family.  I discussed the indications and expected benefits of coronary bypass surgery.  I discussed the alternatives to surgical therapy for his coronary artery disease.  I reviewed the risks associated with the operation, including risk of MI, CVA, bleeding, infection, and death.  He understood the implications for surgery and the risks involved and agreed to proceed with  the operation as planned under informed consent.  OPERATIVE FINDINGS:  The patients body habitus made exposure of the heart especially on the lateral wall very difficult.  The epicardium was covered with a thick layer of epicardial fat.  The coronaries were mainly intramyocardial, sclerotic, calcified, and suboptimal targets.  The saphenous vein was taken from the thighs bilaterally due to poor quality, but adequate vein was exposed and used.  DESCRIPTION OF PROCEDURE:  The patient was brought to the operating room and placed supine on the operating room table, where general anesthesia was induced under invasive hemodynamic monitoring.  The chest, abdomen, and legs were prepped with Betadine and draped as a sterile field.  A median sternotomy was performed as the saphenous vein was harvested from both thighs.  The internal mammary artery was harvested as a pedicle graft from its origin at the subclavian vessels.  Heparin was administered, and the ACT was documented as being therapeutic.  Through pursestrings placed in the ascending aorta and right atrium, the patient was cannulated, placed on bypass, and cooled to 32 degrees.  The heart was inspected and found to be hypertrophied with large amounts of epicardial fat.  The coronaries were difficult to identify.  The LAD, ramus intermedius, and right coronary artery were found to be adequate targets.  The distal circumflex posterolateral was a very small vessel, too small to graft.  The mammary artery and vein grafts were prepared for the distal anastomoses, and a cardioplegia cannula was placed.  The patient was cooled to 28 degrees, and then the aortic crossclamp was  applied.  Five hundred cubic centimeters of cold blood cardioplegia was delivered to the aortic root with immediate cardioplegic arrest and septal temperature dropping to less than 12 degrees.  Topical ice saline slush was used to augment myocardial preservation, and a  pericardial insulator pad was used to protect the left phrenic nerve.  The distal coronary anastomoses were then performed.  The first distal anastomosis was to the right coronary.  This was a nondominant vessel that ran over the acute margin of the heart.  A reversed saphenous vein was sewn end-to-side to this 1.5 mm vessel with running 7-0 Prolene.  There was good flow through the graft.  The second distal anastomosis was to the ramus intermedius, which was the second of two branches.  It was a 1.5 mm vessel, intramyocardial, and deeply embedded in fat as well.  A reversed saphenous vein was sewn end-to-side with a running 7-0 Prolene.  There was good flow through the graft.  The third distal anastomosis was to the distal LAD.  This was a 1.5 mm vessel with proximal 80% stenosis.  The left internal mammary artery pedicle was brought through an opening created in the left lateral pericardium, was brought down on the LAD and sewn end-to-side with running 8-0 Prolene.  There was good flow through the anastomosis with immediate rise in septal temperature after release of the pedicle clamp on the mammary artery. The mammary pedicle was secured to the epicardium, and the aortic crossclamp was removed.  The heart resumed a spontaneous rhythm.  Using a partial occluding clamp, two proximal vein anastomoses were placed on the ascending aorta with a 4.0 mm punch and running 6-0 Prolene.  The partial clamp was removed, and the vein grafts were perfused.  Each had good flow.  The patient was rewarmed to 37 degrees, and temporary pacing wires were applied.  When the patient reached 37 degrees, the lungs were re-expanded, the ventilator was resumed.  The patient was weaned from cardiopulmonary bypass without difficulty, without inotropes, with stable blood pressure and cardiac output.  Protamine was administered, and the cannulas were removed.  The mediastinum was irrigated with warm antibiotic  irrigation.  The leg incisions were irrigated and closed in the  standard fashion.  The pericardium was loosely reapproximated.  Two mediastinal and a left pleural chest tube were placed, brought out through separate incisions.  The sternum was reapproximated with eight interrupted steel wires.  The pectoralis fascia was closed with interrupted #1 Vicryl. The skin and subcutaneous layers were closed with a running Vicryl, and sterile dressings were applied.  Total cardiopulmonary bypass time was 140 minutes, with aortic crossclamp time of 60 minutes. DD:  08/24/00 TD:  08/25/00 Job: [id number]

## 2010-10-16 NOTE — Cardiovascular Report (Signed)
[place]. [facility]  Patient:    Larry Bradford, Larry Bradford                       MRN: [id number] Proc. Date: 08/22/00 Adm. Date:  [id number] Attending:  Berry, Jonathan [place] CC:         Janae Bridgeman. Eloise Harman., M.D.  John R. Aleen Campi, M.D.   Cardiac Catheterization  PROCEDURES PERFORMED:  Cardiac catheterization.  INDICATIONS:  Larry Bradford is a pleasant, 77 year old, white male, original native from [place], who has documented coronary artery disease, status post stenting of his proximal circumflex coronary artery by Dr. Aleen Campi in June o 2000.  At that time, the patient also had concomitant CAD involving an 80% stenosis in a ramus intermediate vessel.  The patient had been treated medically.  He does have a history of hypertension, hyperlipidemia, and was admitted with progressive shortness of breath and new onset chest discomfort. The patient is now referred for catheterization.  The patient also does have a history of abdominal aortic aneurysm.  HEMODYNAMIC DATA:  Central aortic pressure 157/65, left ventricular pressure 157/24.  ANGIOGRAPHIC DATA:  There was evidence for calcification at the ostium of the left main which seems superior on the RAO injection.  Several views did suggest a 70-80% ostial left main stenosis which was not visualized in several views.  The LAO caudle view seemed to demonstrate this optimally.  The left main trifurcated into an LAD, ramus intermediate vessel, and left circumflex vessel.  The LAD had diffuse disease with 60-70% proximal stenosis initiating at the ostium of the left main.  There was 80% proximal stenosis above and beyond the diagonal takeoff and there was diffuse 80-90% stenosis in this first diagonal vessel.  The ramus intermediate vessel was a moderate sized vessel that bifurcated in its proximal third segment.  There was 40-50% diffuse ostial stenosis.  There was 80% stenosis just prior to its bifurcation  with 90% stenosis beyond the bifurcation in the inferior branch and 80% stenosis in the superior branch of this ramus bifurcation.  The AV groove circumflex had a proximal stent in place.  There was 20-30% intimal narrowing within this stented segment.  The remainder of the circumflex vessel was free of significant disease.  The right coronary artery was of small caliber.  There was 70% proximal stenosis diffusely.  The initial injection seemed to suggest an 80% mid stenosis after an RV marginal branch.  This seemed somewhat dynamic, cause other views seemed to suggest this narrowing was much less, and was approximately 50%.  The left internal mammary artery was normal and suitable for CBG surgery.  Biplane cinearteriography revealed normal global LV function.  There was subtle mid posterolateral to low posterolateral hypocontractility.  Distal aortography showed 20-30% left renal artery narrowing.  There was a focal infrarenal aneurysm extending to the iliac bifurcation with mild dilatation of the iliac system particularly on the left.  IMPRESSION: 1. Normal left ventricular function with subtle, focal, mid to low    posterolateral hypocontractility. 2. Coronary calcification with 70-80% ostial left main stenosis; diffuse    70% left anterior descending stenosis at the ostium and proximally    with 80% proximal to mid left anterior descending stenosis and 80-90%    diffuse stenosis in the first diagonal vessel; 50% proximal intermediate    stenosis with 80-90% bifurcation stenosis in this ramus intermediate    vessel; no significant re-stenosis with 20-30% narrowing in the proximal  left circumflex stent from prior intervention; and diffuse 70%    proximal right coronary artery stenosis with dynamic 50% mid    right coronary artery stenoses. 3. Abdominal aortic aneurysm. 4. A 20-30% left renal artery stenosis.  Cineangiograms were reviewed with colleagues.  CVTS consultation  will be obtained due to progressive CAD with left main involvement. DD:  08/22/00 TD:  08/23/00 Job: [id number]

## 2010-10-16 NOTE — Consult Note (Signed)
NAME:  Larry Bradford, Larry Bradford NO.:  [id number]   MEDICAL RECORD NO.:  [id number]          PATIENT TYPE:  EMS   LOCATION:  MAJO                         FACILITY:  [facility]   PHYSICIAN:  Genene Churn. Love, M.D.    DATE OF BIRTH:  [birth year]   DATE OF CONSULTATION:  02/08/2005  DATE OF DISCHARGE:                                   CONSULTATION   PATIENT'S ADDRESS:  [address] [place], [id number].   This is one of several [place] admissions for this  77 year old right-handed white married male seen in the emergency room and  admitted by the [facility] for evaluation of a seizure.   HISTORY OF PRESENT ILLNESS:  [name] has a known history of  hypertension and atherosclerotic vascular disease.  He is status post four-  vessel coronary artery bypass surgery  about six years ago and status post  right leg stent surgery about four years ago.  He has bee complaining  intermittently of right leg pain while walking which had resolved but last  evening, he did complain of some right leg pain which was recurrent.  About  5:30 this morning, his wife was awakened by him throwing his right arm over  her.  She could not get him to remove it.  He developed jerking of his arms  and legs with his with his eyes rolled back and in a fixed stare.  He was  unresponsive for 15 to 20 minutes and the EMS service arrived with a  capillary blood glucose of 105 and received D50W.  He was brought to [facility].  There was no history of head or neck trauma,  prior history of seizures, known history of drug or alcohol abuse.  His wife  thinks he may have had a seizure about six years ago but the patient does  not recall having had any seizures.  There was no associated urinary or  bowel incontinence with the episode but he did bite his tongue.   PAST MEDICAL HISTORY:  1.  Hypertension.  2.  Atherosclerotic vascular disease.  3.   Status post four-vessel coronary artery bypass surgery.  He is status      post right leg stent therapy.  He has had BPH and he has had a left      Bell's palsy.   CURRENT MEDICATIONS:  1.  DynaCirc 0.5 mg daily.  2.  Lipitor 40 mg daily.  3.  Avodart 0.5 mg daily.  4.  Aspirin 81 mg daily.   PHYSICAL EXAMINATION:  GENERAL APPEARANCE:  A well-developed male.  VITAL SIGNS:  Blood pressure right arm 130/80, left arm 140/80, heart rate  64.  There were no bruits.  NEUROLOGIC:  Mental status:  He was alert and oriented x3, score 25/30 on  MMSE.  Cranial nerve examination revealed a narrowed left palpebral fissure  from his old Bell's palsy.  There was no seventh nerve palsy.  The  extraocular movements were full.  Visual fields were full.  Tongue was  midline.  The uvula  was midline.  Gags were present.  Motor examination  revealed good strength in the upper and lower extremities.  He had absent  deep tendon reflexes except a left triceps jerk.  Joint position was intact.  Vibration was intact.  He had high arches bilaterally.  His tongue had been  bitten.  His plantar responses were bilaterally downgoing.   LABORATORY DATA:  CT scan showing bicerebral small vessel ischemic stroke  and mild diffuse atrophy.   Initial serum sodium was 138, potassium 4.1, chloride 105, CO2 content 18  with a glucose of 128 with repeat values of sodium 137, potassium 4.0,  chloride 102, CO2 content 27, BUN 17, creatinine 1.2.  White blood cell  count was elevated at 13,000, hemoglobin 15.0, hematocrit 43.7, platelet  count 227,000.   IMPRESSION:  1.  Single seizure, code 345.10.  2.  Atherosclerotic vascular disease, status post four vessel coronary      artery bypass surgery, code 429.2.  3.  History of stent procedure to the right leg, code 747.60.  4.  History of right leg pain, code 729.5.  5.  Hypertension, code 796.2.  6.  Bicerebral small vessel ischemic strokes, code 433.31.   PLAN:  Place  the patient on seizure precautions.  Obtain a Doppler, MRI and  EEG.           ______________________________  Genene Churn. Sandria Manly, M.D.     JML/MEDQ  D:  02/08/2005  T:  02/08/2005  Job:  [id number]

## 2010-10-16 NOTE — Discharge Summary (Signed)
NAME:  Larry Bradford, Larry Bradford NO.:  [id number]   MEDICAL RECORD NO.:  [id number]          PATIENT TYPE:  INP   LOCATION:  3029                         FACILITY:  [facility]   PHYSICIAN:  Casimiro Needle L. Reynolds, M.D.DATE OF BIRTH:  July 17, 1916   DATE OF ADMISSION:  11/29/2005  DATE OF DISCHARGE:  11/30/2005                                 DISCHARGE SUMMARY   DIAGNOSES AT TIME OF DISCHARGE:  1.  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  2.  Hypertension.  3.  Dyslipidemia.  4.  Obstructive sleep apnea.  5.  Stroke September 2006.  6.  Prostatic hypertrophy.  7.  A 3 cm abdominal aortic aneurysm.  8.  History of gallstones.  9.  Temporal artery biopsy in 2004.  10. Coronary artery disease status post coronary artery bypass graft in      2002.   MEDICINES AT TIME OF DISCHARGE:  1.  Aspirin 325 mg a day.  2.  Lipitor 20 mg a day at bedtime.  3.  Tenormin 25 mg a day.   STUDIES PERFORMED:  1.  CT of the brain on admission shows atrophy and small vessel disease      without acute findings.  2.  EKG shows right bundle branch block without acute findings.  3.  MRI of the brain shows atrophy and chronic small vessel disease.  No      acute abnormality.  No significant change since last exam in September      2006.  4.  MRA of the head.  It shows motion degraded exam with no larger or medium      vessel pathology.  5.  MRA of the neck shows no significant carotid bifurcation.  Proximal      vertebral arteries poorly seen either secondary to technical      deficiencies or proximal vertebral artery stenosis.  6.  Carotid Doppler shows no ICA stenosis.  7.  2D echocardiogram has been completed.  Results are pending.  8.  Transcranial Doppler was not performed.   LABORATORY STUDIES:  Cholesterol 180, triglycerides 142, HDL 33, LDL 119.  His UA was negative.  His hemoglobin A1c was 5.6.  Homocystine was drawn -  results still pending.  Chemistry with SGPT 45, otherwise  normal.  Coagulation studies on admission normal.  CBC with differential normal.   HISTORY OF PRESENT ILLNESS:  Mr. Larry Bradford is an 77 year old Caucasian  male with a history of hypertension, hypercholesterolemia and a single  seizure in September 2006 at which time he was seen by Dr. Sandria Manly but for  which he has not been on chronic anticonvulsives.  The patient states that  two days prior to admission he did not rest very well, but then did not  recall anything specific that was going on.  He did state that yesterday  while in church he noted sudden onset of diplopia, which came and went over  the course of the remainder of the day.  He described the diplopia as  horizontal and said it was generally worse when he looked to his left.  He  had never had anything like this prior.  The morning upon awakening, he  thought the diplopia was a little worse and also reported having a sensation  of vertigo, as if the room was spinning and he felt dizzy.  He does not feel  particularly unsteady on his feet.  He denies any slurred speech, any  numbness, tingling or weakness in the extremities or difficulty with  walking.  He presented to the emergency room for evaluation.  He was  admitted to the hospital for stroke workup.   HOSPITAL COURSE:  MRI was negative for acute infarct.  Of note, patient did  have a sixth nerve palsy on exam and a slightly unsteady tandem gait.  The  patient had recently been on medications, but has stopped taking them  secondary to his stomach hurt.  His LDL in the hospital was elevated at 119.  He has mild obesity and hypertension as other risk factors, as well as  questionable previous stroke in September 2006.  He had a workup completed  in the hospital except for transcranial Dopplers which were not performed  and a 2D echocardiogram was pending at time of discharge.  He possibly could  have had a small brain stem stroke, though it might also be a sixth nerve   palsy.  He will follow up with the [facility] in two months.  In the  meanwhile, he will continue taking his medicines as prescribed by his  primary care physician and follow up with her.   CONDITION ON DISCHARGE:  The patient alert and oriented x3.  No acute  distress.  His extraocular movements are intact and he has a left sixth  palsy.  His face is symmetric.  His tongue and palate are symmetric.  He has  good strength in all four extremities.  He has an improved tandem gait,  though he still leans to the left.   DISCHARGE PLAN:  1.  Discharged home with wife.  2.  Follow up with Dr. Thad Ranger in one to two months.  3.  Take meds as prescribed, as prior to admission.  4.  Follow up with Dr. Nicholos Johns in one month.      Annie Main, N.P.      Marolyn Hammock. Thad Ranger, M.D.  Electronically Signed    SB/MEDQ  D:  11/30/2005  T:  11/30/2005  Job:  [id number]   cc:   Georgianne Fick, M.D.  Fax: [phone]

## 2011-05-18 ENCOUNTER — Other Ambulatory Visit: Payer: Self-pay | Admitting: Internal Medicine

## 2011-05-18 DIAGNOSIS — H02409 Unspecified ptosis of unspecified eyelid: Secondary | ICD-10-CM

## 2011-05-18 DIAGNOSIS — H532 Diplopia: Secondary | ICD-10-CM

## 2011-05-21 ENCOUNTER — Ambulatory Visit
Admission: RE | Admit: 2011-05-21 | Discharge: 2011-05-21 | Disposition: A | Discharge status: Home / Self Care | Payer: BLUE CROSS/BLUE SHIELD | Source: Ambulatory Visit | Attending: Internal Medicine | Admitting: Internal Medicine

## 2011-05-21 DIAGNOSIS — H532 Diplopia: Secondary | ICD-10-CM

## 2011-05-21 DIAGNOSIS — H02409 Unspecified ptosis of unspecified eyelid: Secondary | ICD-10-CM

## 2011-05-21 MED ORDER — GADOBENATE DIMEGLUMINE 529 MG/ML IV SOLN
17.0000 mL | Freq: Once | INTRAVENOUS | Status: AC | PRN
  Administered 2011-05-21: 17 mL via INTRAVENOUS

## 2011-05-21 NOTE — Progress Notes (Signed)
Blood drawn for labs for MRI later this evening; right AC space site unremarkable.  jkl

## 2011-05-27 ENCOUNTER — Other Ambulatory Visit: Payer: Self-pay

## 2011-06-18 ENCOUNTER — Observation Stay (HOSPITAL_COMMUNITY)
Admission: EM | Admit: 2011-06-18 | Discharge: 2011-06-20 | DRG: 101 | Disposition: A | Discharge status: Home / Self Care | Payer: Medicare Other | Attending: Family Medicine | Admitting: Family Medicine

## 2011-06-18 ENCOUNTER — Emergency Department (HOSPITAL_COMMUNITY): Payer: Medicare Other

## 2011-06-18 ENCOUNTER — Other Ambulatory Visit: Payer: Self-pay

## 2011-06-18 ENCOUNTER — Encounter (HOSPITAL_COMMUNITY): Payer: Self-pay | Admitting: Emergency Medicine

## 2011-06-18 DIAGNOSIS — Z9119 Patient's noncompliance with other medical treatment and regimen: Secondary | ICD-10-CM | POA: Insufficient documentation

## 2011-06-18 DIAGNOSIS — I10 Essential (primary) hypertension: Secondary | ICD-10-CM | POA: Insufficient documentation

## 2011-06-18 DIAGNOSIS — R569 Unspecified convulsions: Secondary | ICD-10-CM

## 2011-06-18 DIAGNOSIS — Z951 Presence of aortocoronary bypass graft: Secondary | ICD-10-CM | POA: Insufficient documentation

## 2011-06-18 DIAGNOSIS — R000 Tachycardia, unspecified: Secondary | ICD-10-CM | POA: Insufficient documentation

## 2011-06-18 DIAGNOSIS — E785 Hyperlipidemia, unspecified: Secondary | ICD-10-CM | POA: Insufficient documentation

## 2011-06-18 DIAGNOSIS — R531 Weakness: Secondary | ICD-10-CM | POA: Diagnosis present

## 2011-06-18 DIAGNOSIS — Z91199 Patient's noncompliance with other medical treatment and regimen due to unspecified reason: Secondary | ICD-10-CM | POA: Insufficient documentation

## 2011-06-18 DIAGNOSIS — I2510 Atherosclerotic heart disease of native coronary artery without angina pectoris: Secondary | ICD-10-CM | POA: Insufficient documentation

## 2011-06-18 DIAGNOSIS — J322 Chronic ethmoidal sinusitis: Secondary | ICD-10-CM | POA: Insufficient documentation

## 2011-06-18 DIAGNOSIS — Z79899 Other long term (current) drug therapy: Secondary | ICD-10-CM | POA: Insufficient documentation

## 2011-06-18 DIAGNOSIS — R5381 Other malaise: Secondary | ICD-10-CM | POA: Insufficient documentation

## 2011-06-18 DIAGNOSIS — R0602 Shortness of breath: Secondary | ICD-10-CM | POA: Insufficient documentation

## 2011-06-18 DIAGNOSIS — H532 Diplopia: Secondary | ICD-10-CM | POA: Insufficient documentation

## 2011-06-18 DIAGNOSIS — G40909 Epilepsy, unspecified, not intractable, without status epilepticus: Principal | ICD-10-CM | POA: Insufficient documentation

## 2011-06-18 HISTORY — DX: Aortic aneurysm of unspecified site, without rupture: I71.9

## 2011-06-18 HISTORY — DX: Reserved for concepts with insufficient information to code with codable children: IMO0002

## 2011-06-18 HISTORY — DX: Major depressive disorder, single episode, unspecified: F32.9

## 2011-06-18 HISTORY — DX: Essential (primary) hypertension: I10

## 2011-06-18 HISTORY — DX: Trigeminal neuralgia: G50.0

## 2011-06-18 HISTORY — DX: Unspecified hearing loss, unspecified ear: H91.90

## 2011-06-18 HISTORY — DX: Obstructive sleep apnea (adult) (pediatric): G47.33

## 2011-06-18 HISTORY — DX: Occlusion and stenosis of unspecified carotid artery: I65.29

## 2011-06-18 HISTORY — DX: Unspecified convulsions: R56.9

## 2011-06-18 HISTORY — DX: Hyperlipidemia, unspecified: E78.5

## 2011-06-18 HISTORY — DX: Gastrointestinal hemorrhage, unspecified: K92.2

## 2011-06-18 HISTORY — DX: Benign prostatic hyperplasia without lower urinary tract symptoms: N40.0

## 2011-06-18 HISTORY — DX: Depression, unspecified: F32.A

## 2011-06-18 HISTORY — DX: Atherosclerotic heart disease of native coronary artery without angina pectoris: I25.10

## 2011-06-18 HISTORY — DX: Disorder of kidney and ureter, unspecified: N28.9

## 2011-06-18 LAB — CBC
MCH: 31.4 pg (ref 26.0–34.0)
MCHC: 34.1 g/dL (ref 30.0–36.0)
Platelets: 171 10*3/uL (ref 150–400)

## 2011-06-18 LAB — BASIC METABOLIC PANEL
BUN: 12 mg/dL (ref 6–23)
Creatinine, Ser: 1 mg/dL (ref 0.50–1.35)
GFR calc Af Amer: 73 mL/min — ABNORMAL LOW (ref >90)
GFR calc non Af Amer: 63 mL/min — ABNORMAL LOW (ref >90)

## 2011-06-18 LAB — URINALYSIS, ROUTINE W REFLEX MICROSCOPIC
Bilirubin Urine: NEGATIVE
Ketones, ur: 15 mg/dL — AB
Nitrite: NEGATIVE
Urobilinogen, UA: 0.2 mg/dL (ref 0.0–1.0)

## 2011-06-18 LAB — CARBAMAZEPINE LEVEL, TOTAL: Carbamazepine Lvl: <0.5 ug/mL — ABNORMAL LOW (ref 4.0–12.0)

## 2011-06-18 LAB — URINE MICROSCOPIC-ADD ON

## 2011-06-18 MED ORDER — SODIUM CHLORIDE 0.9 % IV SOLN
Freq: Once | INTRAVENOUS | Status: AC
  Administered 2011-06-18: 16:00:00 via INTRAVENOUS

## 2011-06-18 MED ORDER — LORAZEPAM 2 MG/ML IJ SOLN
INTRAMUSCULAR | Status: AC
  Administered 2011-06-18: 2 mg via INTRAVENOUS
  Filled 2011-06-18: qty 1

## 2011-06-18 MED ORDER — LORAZEPAM 2 MG/ML IJ SOLN
2.0000 mg | Freq: Once | INTRAMUSCULAR | Status: AC
  Administered 2011-06-18: 2 mg via INTRAVENOUS

## 2011-06-18 NOTE — ED Notes (Signed)
Pt recently had MRI for double vision and right sided facial pain described as a burning sensation. Pt was sent to opthalmology for eval of double vision.

## 2011-06-18 NOTE — H&P (Signed)
Larry Bradford is an 78 y.o. male.   Chief Complaint: Seizure HPI:  Unable to obtain addtional history from patient.  There is not family present at bedside and no contact number. He is not oriented to place or time.  He denies having a seizure d/o, HTN, or heart diease.  Complains of some nausea and SOB, denies CP or abdominal pain.  Denies headache.    77 year old, male, with a history of epilepsy, who reportedly takes both tegretol and depakote , presents to the emergency department after he had 2 seizures today. He was at the ophthalmologist office today, when his seizures began, being evaluated for a 3 week history of double vision.  He was sent to the ED for further evaluation. His wife states that he has been not taking his anticonvulsants consistently. He had an MRI,  in December, which did not show any signs of a stroke or mass lesions. He was given Ativan to stop his seizures in the emergency department. He remained post-ictal for a longer than expected period, multiple hours, and has had persistent sinus tachycardia.  He is also complaining of some generalized weakness.   According to the son, his last seizure was 4 months ago, according to his wife it was 20 years ago.  Patient has had a CABG.  It is unclear if he is followed by a neurologist, but his PCP is Dr. Nicholos Johns with [facility].    Past Medical History  Diagnosis Date  . Seizures   . History of kidney problems   . Cataract   . Hard of hearing   . Renal disorder   . Depression     History reviewed. No pertinent past surgical history.  History reviewed. No pertinent family history. Social History:  reports that he has never smoked. He does not have any smokeless tobacco history on file. He reports that he does not drink alcohol. His drug history not on file.  Allergies: No Known Allergies  MEDICATIONS: Only medications found by pharmacy are: tegretol, metoprolol, Vicodin  Medications Prior to  Admission  Medication Dose Route Frequency Provider Last Rate Last Dose  . 0.9 %  sodium chloride infusion   Intravenous Once Nicholes Stairs, MD 125 mL/hr at 06/18/11 1550    . LORazepam (ATIVAN) injection 2 mg  2 mg Intravenous Once Nicholes Stairs, MD   2 mg at 06/18/11 1614   No current outpatient prescriptions on file as of 06/18/2011.    Results for orders placed during the hospital encounter of 06/18/11 (from the past 48 hour(s))  BASIC METABOLIC PANEL     Status: Abnormal   Collection Time   06/18/11  5:03 PM      Component Value Range Comment   Sodium 140  135 - 145 (mEq/L)    Potassium 3.8  3.5 - 5.1 (mEq/L)    Chloride 104  96 - 112 (mEq/L)    CO2 25  19 - 32 (mEq/L)    Glucose, Bld 146 (*) 70 - 99 (mg/dL)    BUN 12  6 - 23 (mg/dL)    Creatinine, Ser 7.25  0.50 - 1.35 (mg/dL)    Calcium 9.2  8.4 - 10.5 (mg/dL)    GFR calc non Af Amer 63 (*) >90 (mL/min)    GFR calc Af Amer 73 (*) >90 (mL/min)   URINALYSIS, ROUTINE W REFLEX MICROSCOPIC     Status: Abnormal   Collection Time   06/18/11  7:53 PM  Component Value Range Comment   Color, Urine YELLOW  YELLOW     APPearance CLEAR  CLEAR     Specific Gravity, Urine 1.021  1.005 - 1.030     pH 5.5  5.0 - 8.0     Glucose, UA NEGATIVE  NEGATIVE (mg/dL)    Hgb urine dipstick MODERATE (*) NEGATIVE     Bilirubin Urine NEGATIVE  NEGATIVE     Ketones, ur 15 (*) NEGATIVE (mg/dL)    Protein, ur 30 (*) NEGATIVE (mg/dL)    Urobilinogen, UA 0.2  0.0 - 1.0 (mg/dL)    Nitrite NEGATIVE  NEGATIVE     Leukocytes, UA NEGATIVE  NEGATIVE    URINE MICROSCOPIC-ADD ON     Status: Abnormal   Collection Time   06/18/11  7:53 PM      Component Value Range Comment   Squamous Epithelial / LPF RARE  RARE     WBC, UA 0-2  <3 (WBC/hpf)    RBC / HPF 11-20  <3 (RBC/hpf)    Bacteria, UA RARE  RARE     Casts HYALINE CASTS (*) NEGATIVE  GRANULAR CAST   Urine-Other MUCOUS PRESENT     CBC     Status: Normal   Collection Time   06/18/11  8:12 PM       Component Value Range Comment   WBC 9.2  4.0 - 10.5 (K/uL)    RBC 4.33  4.22 - 5.81 (MIL/uL)    Hemoglobin 13.6  13.0 - 17.0 (g/dL)    HCT 16.1  09.6 - 04.5 (%)    MCV 92.1  78.0 - 100.0 (fL)    MCH 31.4  26.0 - 34.0 (pg)    MCHC 34.1  30.0 - 36.0 (g/dL)    RDW 40.9  81.1 - 91.4 (%)    Platelets 171  150 - 400 (K/uL)   CARBAMAZEPINE LEVEL, TOTAL     Status: Abnormal   Collection Time   06/18/11  8:51 PM      Component Value Range Comment   Carbamazepine Lvl <0.5 (*) 4.0 - 12.0 (ug/mL)   VALPROIC ACID LEVEL     Status: Abnormal   Collection Time   06/18/11  8:51 PM      Component Value Range Comment   Valproic Acid Lvl <10.0 (*) 50.0 - 100.0 (ug/mL)    Ct Head Wo Contrast  06/18/2011  *RADIOLOGY REPORT*  Clinical Data: Seizure.  Evaluate for bleed.  CT HEAD WITHOUT CONTRAST  Technique:  Contiguous axial images were obtained from the base of the skull through the vertex without contrast.  Comparison: Head CT 08/21/2008.  MRI 110-21-2012.  Findings: Cerebral atrophy is stable.  Small right basilar ganglia lacunar infarction is remote and stable.  The ventricles are stable and within normal limits for size.  Chronic microvascular ischemic changes in the periventricular white matter is unchanged.  There is no hemorrhage, hydrocephalus, mass effect, mass lesion, or evidence of acute cortically based infarction.  Acute and/or chronic sinusitis is seen in the visualized portion of the upper right maxillary sinus, and in several ethmoid air cells. Mastoid air cells are clear.  The skull is intact.  IMPRESSION:  1.  No acute intracranial abnormality. 2.  Chronic microvascular ischemic changes, remote right basal ganglia lacunar infarction, and cerebral atrophy are stable. 3.  Acute and/or chronic sinusitis, partially visualized.  Original Report Authenticated By: Britta Mccreedy, M.D.    Review of Systems  Constitutional: Negative for fever.  HENT: Positive for  sore throat.   Eyes: Positive for  blurred vision and double vision.  Respiratory: Positive for shortness of breath. Negative for cough.   Cardiovascular: Negative for chest pain, palpitations and leg swelling.  Gastrointestinal: Positive for nausea. Negative for heartburn, vomiting, abdominal pain, diarrhea and constipation.  Genitourinary: Negative for dysuria.  Neurological: Positive for dizziness, seizures and weakness. Negative for headaches.       Dizziness with standing    Blood pressure 166/97, pulse 125, temperature 98.2 F (36.8 C), temperature source Oral, resp. rate 21, SpO2 95.00%. Physical Exam  Constitutional: He appears well-developed and well-nourished. No distress.  HENT:  Head: Normocephalic and atraumatic.  Right Ear: External ear normal.  Left Ear: External ear normal.  Mouth/Throat: Oropharynx is clear and moist. No oropharyngeal exudate.  Eyes: Conjunctivae and EOM are normal. Pupils are equal, round, and reactive to light.  Neck: Normal range of motion. Neck supple.  Cardiovascular: Regular rhythm and normal heart sounds.   No murmur heard. Respiratory: Effort normal and breath sounds normal. He has no wheezes. He has no rales.  GI: Soft. Bowel sounds are normal. He exhibits no distension. There is no tenderness.  Musculoskeletal: Normal range of motion. He exhibits no edema.  Neurological: He is alert. He has normal strength. He is disoriented. No cranial nerve deficit or sensory deficit.       Not oriented to place or time, short attention span, able to follow most commands with significant prompting  Skin: Skin is warm and dry.     Assessment/Plan 78 yo male with known seizure discorder taking anticonvulsants inconsistently admitted for prolonged post-ictal state and weakness  1. Seizures: patient is supposed to be on tegratol at home, also a question of valproic acid, but pharmacy did not find this when reconciling home medications - Restart home tegretol, according to pharmacy, no loading  dose is needed - Both valproic acid and tegretol levels are low/ not detectable, but it is unclear 1. If patient is supposed to even be taking valproic acid and 2. If patient has been taking his tegretol    2. Confusion/Weakness/Fatigue: May be 2/2 seizure vs medications given - Consider PT/OT eval in the morning  - Will check cardiac enzymes - May be a component of dementia with acute delirium - Stat head CT to r/o bleed, had relatively normal MRI 3 weeks ago for blurry vision evaluation  3. Hypertension: BPs currently elevated - Continue home metoprolol - IV Hydralazine PRN  4. Tachycardia: Unclear etiology - Cycle cardiac enzymes, patient with h/o CABG and is poor historian - CXR to r/o pulmonary cause - Gentle hydration since PMH unclear     -FEN/GI: gentle hydration with NS @ 75cc/hr, heart healthy diet once passes bedside RN swallow eval -Ppx: heparin -Dispo: will need to obtain more information in the morning from family  Nanette Wirsing 06/18/2011, 10:53 PM

## 2011-06-18 NOTE — ED Notes (Signed)
No change in pts status. Remains on monitor.

## 2011-06-18 NOTE — ED Notes (Signed)
Pt with noted focal seizure affecting left arm. Lasting approx 30 seconds. 2mg  of ativan given per EDP. Seizing has stopped. Right pupil < Left pupil. EDP made aware. Pt still very postictal, will not follow commands. Airway intact. Will not tolerate [address] or NRB.

## 2011-06-18 NOTE — ED Provider Notes (Addendum)
I saw and evaluated the patient, reviewed the resident's note and I agree with the findings and plan. 78 year old, male, with a history of epilepsy, who takes both tegretol and depakote , presents to the emergency department after he had 3 seizures today.  Apparently he has had double vision for the past month.  He was at the ophthalmologist office today, when his seizures began.  Therefore, he was sent to the emergency department for further evaluation.  His wife states that he has been not taking his anticonvulsants.  He had an MRI, which we reviewed, in December, which did not show any signs of a stroke or mass lesions.  He was given Ativan to stop his seizures in the emergency department.  We will perform laboratory testing, and then make decisions based upon his clinical status and the test results.  Nicholes Stairs, MD 06/18/11 2038  ED ECG REPORT   Date: 06/19/2011  EKG Time: 12:03 AM  Rate: 141  Rhythm: sinus tachycardia,    Axis: nl  Intervals:right bundle branch block  ST&T Change: nonspecific  Narrative Interpretation: st with rbbb            Nicholes Stairs, MD 06/19/11 0004  Nicholes Stairs, MD 06/19/11 0005

## 2011-06-18 NOTE — ED Notes (Signed)
Pt resting, arouses easily. Family requesting that monitor stop beeping because pt has a headache. Adjusted alarm setting.

## 2011-06-18 NOTE — ED Notes (Signed)
Received pt after report. Family at bedside, pt drowsy, appears to be sleeping, arouses easily. Pt is pulling off oxygen tubing, tossing and disconnecting leads. Pt placed on all monitors and repositioned

## 2011-06-18 NOTE — ED Notes (Addendum)
EDP asked to speak with family regarding poc. Pt placed on 2L of O2 via [place]. Pts O2 sat dropped to 78% on RA. Pt stimulated and was able to slightly open eyes for me. EDP aware.

## 2011-06-18 NOTE — ED Notes (Signed)
Pt got out of bed alone and was found urinating in sink. Pt was assisted back to be bed, monitors replaced and ivf restarted. Attempted to educate pt on falls and using call light

## 2011-06-18 NOTE — ED Notes (Signed)
Pt was at the optometrist and a seizure at the doctors office. Upon arrival to er pt had another seizure

## 2011-06-18 NOTE — ED Notes (Signed)
Received pt at 1540. Pt no longer seizing. No meds given to stop seizing. Airway patent. Pt placed in gown and vitals obtained. IV fluids started. Pt is very groggy, will grunt when you call his name. Family brought to bedside and updated on poc. Family reports pt has been complaining of headaches and has hx of seizures.

## 2011-06-18 NOTE — ED Notes (Signed)
Patient transported to CT via stretcher.

## 2011-06-18 NOTE — ED Provider Notes (Signed)
History     CSN: [id number]  Arrival date & time 06/18/11  1527   None     Chief Complaint  Patient presents with  . Seizures    (Consider location/radiation/quality/duration/timing/severity/associated sxs/prior treatment) Patient is a 78 y.o. male presenting with seizures. The history is provided by the spouse and the EMS personnel.  Seizures  This is a recurrent problem. The current episode started less than 1 hour ago. The problem has been resolved. Number of times: 2. The most recent episode lasted 2 to 5 minutes. Pertinent negatives include no chest pain, no vomiting and no diarrhea. Characteristics include rhythmic jerking, loss of consciousness and cyanosis. The episode was witnessed. The seizure(s) had no focality. Possible causes include missed seizure meds. There has been no fever. Meds prior to arrival: "They gave him a shot of something at the [facility]"    Past Medical History  Diagnosis Date  . Seizures   . History of kidney problems     No past surgical history on file.  No family history on file.  History  Substance Use Topics  . Smoking status: Not on file  . Smokeless tobacco: Not on file  . Alcohol Use:       Review of Systems  Constitutional: Negative for fever.  HENT: Negative for congestion and rhinorrhea.   Respiratory: Negative for shortness of breath.   Cardiovascular: Positive for cyanosis. Negative for chest pain.  Gastrointestinal: Negative for vomiting, abdominal pain and diarrhea.  Neurological: Positive for seizures and loss of consciousness.       Double vision   All other systems reviewed and are negative.  Obtained from wife, unable to obtain from patient  Allergies  Review of patient's allergies indicates no known allergies.  Home Medications  No current outpatient prescriptions on file.  BP 185/101  Pulse 139  Temp 98 F (36.7 C)  Resp 26  SpO2 98%  Physical Exam  Constitutional: He appears well-developed and  well-nourished.  HENT:       Small amount of blood from tongue  Eyes: Right eye exhibits discharge. Left eye exhibits discharge.  Neck: No tracheal deviation present.  Cardiovascular: Normal rate, regular rhythm and intact distal pulses.   Pulmonary/Chest: Accessory muscle usage present. No stridor. No apnea and not tachypneic.       Sonorous respirations, cyanotic lips.   Abdominal: Soft. Bowel sounds are normal.  Musculoskeletal: Normal range of motion.  Neurological: He is unresponsive.  Skin: Skin is warm and dry.    ED Course  Procedures (including critical care time)  Labs Reviewed  BASIC METABOLIC PANEL - Abnormal; Notable for the following:    Glucose, Bld 146 (*)    GFR calc non Af Amer 63 (*)    GFR calc Af Amer 73 (*)    All other components within normal limits  URINALYSIS, ROUTINE W REFLEX MICROSCOPIC - Abnormal; Notable for the following:    Hgb urine dipstick MODERATE (*)    Ketones, ur 15 (*)    Protein, ur 30 (*)    All other components within normal limits  CARBAMAZEPINE LEVEL, TOTAL - Abnormal; Notable for the following:    Carbamazepine Lvl <0.5 (*)    All other components within normal limits  VALPROIC ACID LEVEL - Abnormal; Notable for the following:    Valproic Acid Lvl <10.0 (*)    All other components within normal limits  URINE MICROSCOPIC-ADD ON - Abnormal; Notable for the following:    Casts HYALINE  CASTS (*) GRANULAR CAST   All other components within normal limits  CBC  URINALYSIS, ROUTINE W REFLEX MICROSCOPIC  CARBAMAZEPINE LEVEL, TOTAL  VALPROIC ACID LEVEL     1. Seizure       MDM  78 yo male with seizure disorder presenting after two seizures.  First one occurred at [facility] doctor, witnessed by wife.  Lasted 5 minutes.  Given "a shot".  Positive Post ictal period.  Then seized again when he was being checked in to the ED.  He appeared post ictal on initial contact.  Once in ED exam room, he appeared in NAD, was unresponsive, but moving  extremities.  He was afebrile, tachycardic, but otherwise with stable vitals.  He had a brief additional seizure, witnessed by nursing staff but not MD, for which he was given the previously ordered Ativan.  After several hours of monitoring, he would awaken to voice, follow commands, move all extremities, but then fall back asleep.  He remained tachycardic.  He was given IV fluids, labs were checked and were remarkable for undetectable antiepileptic levels, but were otherwise unremarkable.  Of note, he had an MRI 3 weeks ago during a workup for double vision which showed only chronic changes.  Did not feel that repeat imaging warranted.  Given his tachycardia, age, and repeated seizures, have consulted Family Practice for admission for further workup and treatment.          Warnell Forester, MD 06/18/11 2340  Warnell Forester, MD 06/18/11 [phone]

## 2011-06-19 ENCOUNTER — Inpatient Hospital Stay (HOSPITAL_COMMUNITY): Payer: Medicare Other

## 2011-06-19 ENCOUNTER — Encounter (HOSPITAL_COMMUNITY): Payer: Self-pay | Admitting: Sports Medicine

## 2011-06-19 DIAGNOSIS — I6529 Occlusion and stenosis of unspecified carotid artery: Secondary | ICD-10-CM | POA: Insufficient documentation

## 2011-06-19 DIAGNOSIS — R569 Unspecified convulsions: Secondary | ICD-10-CM

## 2011-06-19 DIAGNOSIS — I10 Essential (primary) hypertension: Secondary | ICD-10-CM | POA: Insufficient documentation

## 2011-06-19 DIAGNOSIS — N400 Enlarged prostate without lower urinary tract symptoms: Secondary | ICD-10-CM | POA: Insufficient documentation

## 2011-06-19 DIAGNOSIS — F329 Major depressive disorder, single episode, unspecified: Secondary | ICD-10-CM | POA: Insufficient documentation

## 2011-06-19 DIAGNOSIS — I2510 Atherosclerotic heart disease of native coronary artery without angina pectoris: Secondary | ICD-10-CM | POA: Insufficient documentation

## 2011-06-19 DIAGNOSIS — E785 Hyperlipidemia, unspecified: Secondary | ICD-10-CM | POA: Insufficient documentation

## 2011-06-19 DIAGNOSIS — H9190 Unspecified hearing loss, unspecified ear: Secondary | ICD-10-CM | POA: Insufficient documentation

## 2011-06-19 DIAGNOSIS — H269 Unspecified cataract: Secondary | ICD-10-CM | POA: Insufficient documentation

## 2011-06-19 LAB — CBC
HCT: 39.6 % (ref 39.0–52.0)
HCT: 41.1 % (ref 39.0–52.0)
Hemoglobin: 13.6 g/dL (ref 13.0–17.0)
MCHC: 33.1 g/dL (ref 30.0–36.0)
RBC: 4.4 MIL/uL (ref 4.22–5.81)
RDW: 13.5 % (ref 11.5–15.5)
WBC: 9.3 10*3/uL (ref 4.0–10.5)
WBC: 8.4 10*3/uL (ref 4.0–10.5)

## 2011-06-19 LAB — CREATININE, SERUM
Creatinine, Ser: 0.99 mg/dL (ref 0.50–1.35)
GFR calc Af Amer: 80 mL/min — ABNORMAL LOW (ref >90)
GFR calc non Af Amer: 69 mL/min — ABNORMAL LOW (ref >90)

## 2011-06-19 LAB — BASIC METABOLIC PANEL
BUN: 10 mg/dL (ref 6–23)
Chloride: 104 mEq/L (ref 96–112)
GFR calc Af Amer: 83 mL/min — ABNORMAL LOW (ref >90)
GFR calc non Af Amer: 72 mL/min — ABNORMAL LOW (ref >90)
Potassium: 3.6 mEq/L (ref 3.5–5.1)
Sodium: 141 mEq/L (ref 135–145)

## 2011-06-19 LAB — CARDIAC PANEL(CRET KIN+CKTOT+MB+TROPI)
CK, MB: 3.2 ng/mL (ref 0.3–4.0)
Relative Index: 3.1 — ABNORMAL HIGH (ref 0.0–2.5)
Total CK: 103 U/L (ref 7–232)
Troponin I: <0.3 ng/mL (ref <0.30)
Troponin I: <0.3 ng/mL (ref <0.30)

## 2011-06-19 LAB — PRO B NATRIURETIC PEPTIDE: Pro B Natriuretic peptide (BNP): 481 pg/mL — ABNORMAL HIGH (ref 0–450)

## 2011-06-19 MED ORDER — ASPIRIN 81 MG PO CHEW
81.0000 mg | CHEWABLE_TABLET | Freq: Every day | ORAL | Status: DC
  Administered 2011-06-19 – 2011-06-20 (×2): 81 mg via ORAL
  Filled 2011-06-19 (×2): qty 1

## 2011-06-19 MED ORDER — FLUTICASONE PROPIONATE 50 MCG/ACT NA SUSP
2.0000 | Freq: Every day | NASAL | Status: DC
  Administered 2011-06-19 – 2011-06-20 (×2): 2 via NASAL
  Filled 2011-06-19: qty 16

## 2011-06-19 MED ORDER — LEVETIRACETAM 500 MG PO TABS
500.0000 mg | ORAL_TABLET | Freq: Two times a day (BID) | ORAL | Status: DC
  Administered 2011-06-19 – 2011-06-20 (×3): 500 mg via ORAL
  Filled 2011-06-19 (×5): qty 1

## 2011-06-19 MED ORDER — SODIUM CHLORIDE 0.9 % IV SOLN
INTRAVENOUS | Status: DC
  Administered 2011-06-19 – 2011-06-20 (×2): via INTRAVENOUS

## 2011-06-19 MED ORDER — CARBAMAZEPINE ER 200 MG PO TB12
200.0000 mg | ORAL_TABLET | Freq: Two times a day (BID) | ORAL | Status: DC
  Administered 2011-06-19 (×2): 200 mg via ORAL
  Filled 2011-06-19 (×3): qty 1

## 2011-06-19 MED ORDER — METOPROLOL SUCCINATE ER 25 MG PO TB24
25.0000 mg | ORAL_TABLET | Freq: Every day | ORAL | Status: DC
  Administered 2011-06-19 – 2011-06-20 (×2): 25 mg via ORAL
  Filled 2011-06-19 (×2): qty 1

## 2011-06-19 MED ORDER — ONDANSETRON HCL 4 MG PO TABS: 4.0000 mg | ORAL_TABLET | Freq: Four times a day (QID) | ORAL | Status: DC | PRN

## 2011-06-19 MED ORDER — HEPARIN SODIUM (PORCINE) 5000 UNIT/ML IJ SOLN
5000.0000 [IU] | Freq: Three times a day (TID) | INTRAMUSCULAR | Status: DC
  Administered 2011-06-19 – 2011-06-20 (×4): 5000 [IU] via SUBCUTANEOUS
  Filled 2011-06-19 (×8): qty 1

## 2011-06-19 MED ORDER — ACETAMINOPHEN 325 MG PO TABS: 650.0000 mg | ORAL_TABLET | Freq: Four times a day (QID) | ORAL | Status: DC | PRN

## 2011-06-19 MED ORDER — ONDANSETRON HCL 4 MG/2ML IJ SOLN: 4.0000 mg | Freq: Four times a day (QID) | INTRAMUSCULAR | Status: DC | PRN

## 2011-06-19 MED ORDER — ACETAMINOPHEN 650 MG RE SUPP: 650.0000 mg | Freq: Four times a day (QID) | RECTAL | Status: DC | PRN

## 2011-06-19 NOTE — Progress Notes (Signed)
06/19/11 Nursing Admission Note Patient received to 6703-1 via stretcher. Oriented to person and place. Patient appears very drowsy, but is easily aroused. Telemetry in place. No distress noted. No contact isolation. No skin issues. Patient is currently alone. Bed alarms in place for safety. Attempted to orient patient to unit and surroundings; call bell within reach. Will continue to monitor.  C.Wyonia Fontanella, RN.

## 2011-06-19 NOTE — ED Provider Notes (Signed)
I saw and evaluated the patient, reviewed the resident's note and I agree with the findings and plan.  Nicholes Stairs, MD 06/19/11 0020

## 2011-06-19 NOTE — Progress Notes (Signed)
Spoke with Dr. Aura Camps who was able to speak with Dr. Hazle Quant regarding Mr. Larry Bradford appointment at [facility] which is where his seizure occurred.  He did have an eye exam performed was reported as overall normal.  Per report he had 20/30 and 20/40 vision with IOPs 20&30.  No affective pupillary defects.    We will defer formal Ophthalmology consult while In Patient but will plan on close follow up as Out Patient.  The patient does have a long standing history of intermittent diplopia with a non-revealing workup in the past.  We will plan to continue to monitor today and pending MRI results consider d/c on 1/20.  Will attempt to update family.  Gaspar Bidding, DO [facility] Resident - PGY-1 06/19/2011 1:42 PM FMTS Intern Page: [phone]

## 2011-06-19 NOTE — Progress Notes (Signed)
Family Medicine Teaching Service Daily Resident Note   Patient name: Larry Bradford Medical record number: [id number] Date of birth: [birth year] Age: 78 y.o. Gender: male Length of Stay:  LOS: 1 day             SUBJECTIVE & OVERNIGHT EVENTS  Pt reports feeling better today, still having difficulty remembering yesterdays events.  Had inappropriate behaviors overnight with agitation and consfusion (pt reported to have urinated in sink).            OBJECTIVE  Vitals: Patient Vitals for the past 24 hrs:  BP Temp Temp src Pulse Resp SpO2 Height Weight  06/19/11 0429 162/98 mmHg 97.9 F (36.6 C) Oral 125  20  93 % - -  06/19/11 0059 164/94 mmHg 97.3 F (36.3 C) Oral 120  19  92 % 5\' 6"  (1.676 m) 182 lb 5.1 oz (82.7 kg)  06/18/11 2327 149/89 mmHg 97.7 F (36.5 C) Oral 123  23  96 % - -  06/18/11 2151 166/97 mmHg 98.2 F (36.8 C) Oral 125  21  95 % - -  06/18/11 1828 171/101 mmHg - - 138  22  99 % - -  06/18/11 1700 116/81 mmHg - - 139  21  96 % - -  06/18/11 1657 151/93 mmHg - - 139  18  96 % - -  06/18/11 1645 151/93 mmHg - - 143  22  86 % - -  06/18/11 1600 164/81 mmHg - - 137  23  93 % - -  06/18/11 1545 185/101 mmHg - - - - - - -  06/18/11 1544 210/106 mmHg 98 F (36.7 C) - 139  26  98 % - -  06/18/11 1534 - - - - - 95 % - -   Wt Readings from Last 3 Encounters:  06/19/11 182 lb 5.1 oz (82.7 kg)    Intake/Output Summary (Last 24 hours) at 06/19/11 0810 Last data filed at 06/19/11 0659  Gross per 24 hour  Intake 436.25 ml  Output    300 ml  Net 136.25 ml   PE: GENERAL:  Elderly Caucasian male examined in [facility] 6700.  In no acute discomfort, no resp distress.   HNEENT: AT/[place], slight L sided facial droop with mild ptosis H&N: supple, trachea midline and no carotid bruits OROPHARYNX: lips, mucosa, and tongue normal; teeth and gums normal EYES: conjunctivae/corneas clear. PERRL, EOM's intact.  EARS: normal set and appearance NOSE: normal THORAX: HEART: RRR, S1/S2 heard, no  murmur LUNGS: CTA B, no wheezes, no crackles ABDOMEN:  +BS, soft, non-tender, no rigidity, no guarding, no masses/organomegaly EXTREMITIES: Moves all 4 extremities spontaneously, warm well perfused, no edema, bilateral DP and PT pulses 1-/4.    LABS: Hematology:  Lab 06/19/11 0600 06/19/11 0148 06/18/11 2012  WBC 8.4 9.3 9.2  HGB 13.6 13.6 13.6  HCT 41.1 39.6 39.9  PLT 148* 185 171  RDW 13.6 13.5 13.6  MCV 93.4 92.3 92.1  MCHC 33.1 34.3 34.1   Metabolic:  Lab 06/19/11 0600 06/19/11 0148 06/18/11 1703  NA 141 -- 140  K 3.6 -- 3.8  CL 104 -- 104  CO2 26 -- 25  BUN 10 -- 12  CREATININE 0.89 0.99 1.00  GLUCOSE 105* -- 146*  CALCIUM 9.1 -- 9.2  MG -- -- --  PHOS -- -- --   MICRO None  IMAGING: No new imaging            ASSESSMENT & PLAN  78  yo male with known seizure disorder taking anticonvulsants inconsistently admitted for prolonged post-ictal state and weakness as well has 3 week history of diplopia.    1. NEURO (Seizures, Hx of ?CVA, Trigeminal Neuralgia, Blurry Vision/Diplopia, Altered Mental Status, Weakness, Fatigue):  No recurrent seizures overnight.  Consider eliciting event of eye exam as pt reported some aura following bright lights during the exam.    Mental Status: improved likely secondary to post-ictal state with component of acute delirum - no resolved  CT non-concerning for Acute Hemorrhagic Stroke  Repeat MRI in setting of Seizure with thin slices through brain stem -  [ ]  MRI BRAIN  Prior Work up for CVA - unrevealing for large watershed stroke however some evidence of HTN micro-lesions.    Carotid stenosis per history from PCP but unable to find documented lesions - no carotid bruits appreciated today.    Will get Neuro consult today - suggest starting Keppra as this is the most well tolerated antiseizure med; will stop tegretol as no longer having trigeminal neuralgia sx  Diplopia - Dr. Hazle Quant is sending records from exam yesterday in his  office.  Appreciate Dr. Jilda Roche assistance in obtaining these records. Will hold off on formal Ophthalmology consult at this time.  Pt is supposed to be on tegratol at home - reported from PCP for tx of his trigeminal neuralgia but tx underlying Seizure disorder; per PCP pt non compliant with MEDS  *Keppra 500 bid X 3 days - increase to 750mg  po bid at that time  Per PCP pt independent at baseline and without signs of dementia. Confusion weakness and fatigue improved today; hold off on PT/OT consult  2. CARDIOVASCULAR (HTN, Tachycardia, Hx of Carotid Stenosis, PAD, CAD s/p CABG, ?CVA Hx, Hx of Lower GI Bleed):  Unknown EF - will be gentile with fluid recussitation  Could consider carotid dopplers but pt reports not being interested in surg at this time  CE Cycling - none reported at this time [ ]  CEs  Continue Home antihypertensive regimen and continue to monitor - pt reports not being on any medications currently but has been rx'd in the past *Metoprolol   Not on antiplatelet med as OP - Will start ASA but will need close monitoring 2o/2 hx of Lower GI bleed *ASA 81mg   Hx of Lower GI bleed is concerning for DVT prophylaxis will continue at this time with close monitoring while acutely hospitalized.  Follow ASA tx closes as OP and monitor hemocults  3. RESP (Reported Hx of OSA):  Per report in setting of significant vascular disease may benefit from formal sleep study as OP; last sleep study ~31yrs ago but may not be interested in CPAP; conitinue discussion as OP with PCP  Chest X-ray without acute pulmonary findings  4. RENAL:  Report of ?Hx of Renal Insuffiency - Cr 1.00 today  Consider micro-albumin in setting of significant vascular disease, may benefit from addition of ACEi  5. METABOLIC (HLD, isolated elevated CBG):  Consider A1C in setting of isolated CBG of 150s on admission - unknown prandial state at that time  --- FEN/GI: Heart Healthy Diet --- IVFs: NS @ 75mL /  hr  --- PPx: Heparin - consider change to SCDs in setting of hx of GI bleed            DISPOSITION  Neuro consulted.  Pending Ophthalmology reports.  Pt improving; would benefit from multiple additions to his medication regimen but ?adherence and appropriateness in this 78 yo M.  Pt overall feels improved and doesn't want to takes medications unless they make him feel better or if he feels like they are helping him.  May benefit from Home Health nursing for help with medication compliance.  Will update family later in the day when they arrive.    Gaspar Bidding, DO [facility] Resident - PGY-1 06/19/2011 8:10 AM FMTS Intern Page: [phone]

## 2011-06-19 NOTE — H&P (Signed)
I have seen and examined this patient. I have discussed with Dr Fara Boros.  I agree with their findings and plans as documented in their admission note. I spoke with patient who was oriented to location, year, month, date.  I spoke with patient's common-law wife's daughter by phone.   Acute Issues  1. Seizure events, witnessed  - No Seizure events overnight.  - Pt denies having had sz for 20 years, but step-dgt reports frequent sz events at home, often patient just "sleeps it off" after a sz.  He will get new sz medication prescriptions filled, but then does not take them because of adverse effects.  - Step-dgt reports that family has been talking about getting pt a neurology consult, but have not yet.  She requests that we consult neurology during this hospitalization.  - Pt was on Tegretol for Trigeminal Neuralgia, not necessarily for sz disorder.  Pt stopped it when facial pain resolved.  Pt's pharmacy has not dispensed Valproate to patient. Step-dgt did not recall patient being on Valproate.  - Pt had hospital admission in 12/2007 for generalized seizure events.  Seen by  Dr Buzzy Han [facility]) who recommended starting Keppra and follow up with Dr Thad Ranger at [facility] in 2 months.  Uncertain if patient followed up at [facility].   - Head CT last nite showed remote basal ganglia infarct and chronic microvacular changes.  Plan: Continue Tegretol.  Consult Neurology for sz disorder assessment and management.  Consider starting Keppra and stopping Tegretol if pt was not intolerant of Keppra.  ATivan 2 mg IV for acute sz events inhouse.   2. Tachycardia - Ectopic atrial tachycardia on EKG. - EKG: V6 with 0.5 mm ST depression.  - CM on CXR (2V) - S/P CABG 2002 - Plan: Will check echocariogram and BNP for evidence of heart failure/  Doubt Pulm embolism given symmetric calf, no cp, no sob.  Does not appear dehydrated.   3. Ethmoid sinusitis, chronic -Found on CT  head. -Plan: start flonase

## 2011-06-19 NOTE — Progress Notes (Signed)
06/19/11 Nursing Note Patient with BP 162/98, HR 125. Dr. Fara Boros notified. No new orders received. Will continue to monitor.  C.Emmauel Hallums, RN.

## 2011-06-19 NOTE — Progress Notes (Signed)
Called PCP and discussed patient with him.  According to PCP, patient is usually very aware and does not have a diagnosis od dementia, although he does have poor insight into his chronic illnesses and is very noncompliant with his medications. PMH includes: -CAD -HTN -HLD -Carotid stenosis -AAA -Degenerative disk disease -BPH -Trigeminal neuralgia -H/o lower GI bleed  He has never been complaint with antiepileptics and tegretol was for trigeminal neuralgia, which he stopped taking once this pain resolved.    Larry Bradford 06/19/2011 9:03 AM

## 2011-06-19 NOTE — Consult Note (Signed)
Reason for Consult: "epilepsy"  HPI: Larry Bradford is an 78 y.o. Male who has a history of epilepsy and poor medication compliance. Has been on Keppra in the past. Recently on Depakote. Has been taking Tegretol for Trigeminal Neuralgia. Patient states that he has not had seizures for 20 years, but relatives say that he's had them at home and would just sleep them off. Seizures are believed to be GTC. Patient has had double vision and left eye ptosis for 3 weeks. MRI brain at the time did not show an acute CVA.   Past Medical History  Diagnosis Date  . Seizures     Onset in Sept 2006  . Cataract   . Hard of hearing   . Renal disorder   . Depression   . HTN (hypertension)   . BPH (benign prostatic hyperplasia)   . CAD (coronary artery disease)     s/p CABG in 2002  . Hyperlipidemia   . OSA (obstructive sleep apnea)   . Aortic aneurysm     3 CM in 2007  . Carotid stenosis   . DDD (degenerative disc disease)   . Trigeminal neuralgia   . Lower GI bleed    Medications: I have reviewed the patient's current medications.  Past Surgical History  Procedure Date  . Coronary artery bypass graft 2002  . Temporal artery biopsy / ligation 2004   History reviewed. No pertinent family history.  Social History:  reports that he has never smoked. He does not have any smokeless tobacco history on file. He reports that he does not drink alcohol. His drug history not on file.  Allergies: No Known Allergies  ROS: as above  Blood pressure 162/98, pulse 125, temperature 97.9 F (36.6 C), temperature source Oral, resp. rate 20, height 5\' 6"  (1.676 m), weight 82.7 kg (182 lb 5.1 oz), SpO2 93.00%.  Neurological exam: AAO*3. No aphasia.  Followed complex commands. Cranial nerves: EOMI, PERRL. Visual fields were full. Sensation to V1 through V3 areas of the face was intact and symmetric throughout. Decreased hearing in both ears bilaterally. There was no facial asymmetry. Hearing to finger rub was  equal and symmetrical bilaterally. Shoulder shrug was 5/5 and symmetric bilaterally. Head rotation was 5/5 bilaterally. There was no dysarthria or palatal deviation. Motor: strength was 5/5 and symmetric throughout. Sensory: was intact throughout to light touch, pinprick, vibration and proprioception. Coordination: finger-to-nose were intact and symmetric bilaterally. Reflexes: were 2+ in upper extremities and 1+ at the knees and 1+ at the ankles. Plantar response was downgoing bilaterally. Gait: deferred  Results for orders placed during the hospital encounter of 06/18/11 (from the past 48 hour(s))  BASIC METABOLIC PANEL     Status: Abnormal   Collection Time   06/18/11  5:03 PM      Component Value Range Comment   Sodium 140  135 - 145 (mEq/L)    Potassium 3.8  3.5 - 5.1 (mEq/L)    Chloride 104  96 - 112 (mEq/L)    CO2 25  19 - 32 (mEq/L)    Glucose, Bld 146 (*) 70 - 99 (mg/dL)    BUN 12  6 - 23 (mg/dL)    Creatinine, Ser 6.44  0.50 - 1.35 (mg/dL)    Calcium 9.2  8.4 - 10.5 (mg/dL)    GFR calc non Af Amer 63 (*) >90 (mL/min)    GFR calc Af Amer 73 (*) >90 (mL/min)   URINALYSIS, ROUTINE W REFLEX MICROSCOPIC  Status: Abnormal   Collection Time   06/18/11  7:53 PM      Component Value Range Comment   Color, Urine YELLOW  YELLOW     APPearance CLEAR  CLEAR     Specific Gravity, Urine 1.021  1.005 - 1.030     pH 5.5  5.0 - 8.0     Glucose, UA NEGATIVE  NEGATIVE (mg/dL)    Hgb urine dipstick MODERATE (*) NEGATIVE     Bilirubin Urine NEGATIVE  NEGATIVE     Ketones, ur 15 (*) NEGATIVE (mg/dL)    Protein, ur 30 (*) NEGATIVE (mg/dL)    Urobilinogen, UA 0.2  0.0 - 1.0 (mg/dL)    Nitrite NEGATIVE  NEGATIVE     Leukocytes, UA NEGATIVE  NEGATIVE    URINE MICROSCOPIC-ADD ON     Status: Abnormal   Collection Time   06/18/11  7:53 PM      Component Value Range Comment   Squamous Epithelial / LPF RARE  RARE     WBC, UA 0-2  <3 (WBC/hpf)    RBC / HPF 11-20  <3 (RBC/hpf)    Bacteria, UA RARE   RARE     Casts HYALINE CASTS (*) NEGATIVE  GRANULAR CAST   Urine-Other MUCOUS PRESENT     CBC     Status: Normal   Collection Time   06/18/11  8:12 PM      Component Value Range Comment   WBC 9.2  4.0 - 10.5 (K/uL)    RBC 4.33  4.22 - 5.81 (MIL/uL)    Hemoglobin 13.6  13.0 - 17.0 (g/dL)    HCT 40.9  81.1 - 91.4 (%)    MCV 92.1  78.0 - 100.0 (fL)    MCH 31.4  26.0 - 34.0 (pg)    MCHC 34.1  30.0 - 36.0 (g/dL)    RDW 78.2  95.6 - 21.3 (%)    Platelets 171  150 - 400 (K/uL)   CARBAMAZEPINE LEVEL, TOTAL     Status: Abnormal   Collection Time   06/18/11  8:51 PM      Component Value Range Comment   Carbamazepine Lvl <0.5 (*) 4.0 - 12.0 (ug/mL)   VALPROIC ACID LEVEL     Status: Abnormal   Collection Time   06/18/11  8:51 PM      Component Value Range Comment   Valproic Acid Lvl <10.0 (*) 50.0 - 100.0 (ug/mL)   CBC     Status: Normal   Collection Time   06/19/11  1:48 AM      Component Value Range Comment   WBC 9.3  4.0 - 10.5 (K/uL)    RBC 4.29  4.22 - 5.81 (MIL/uL)    Hemoglobin 13.6  13.0 - 17.0 (g/dL)    HCT 08.6  57.8 - 46.9 (%)    MCV 92.3  78.0 - 100.0 (fL)    MCH 31.7  26.0 - 34.0 (pg)    MCHC 34.3  30.0 - 36.0 (g/dL)    RDW 62.9  52.8 - 41.3 (%)    Platelets 185  150 - 400 (K/uL)   CREATININE, SERUM     Status: Abnormal   Collection Time   06/19/11  1:48 AM      Component Value Range Comment   Creatinine, Ser 0.99  0.50 - 1.35 (mg/dL)    GFR calc non Af Amer 69 (*) >90 (mL/min)    GFR calc Af Amer 80 (*) >90 (mL/min)   BASIC  METABOLIC PANEL     Status: Abnormal   Collection Time   06/19/11  6:00 AM      Component Value Range Comment   Sodium 141  135 - 145 (mEq/L)    Potassium 3.6  3.5 - 5.1 (mEq/L)    Chloride 104  96 - 112 (mEq/L)    CO2 26  19 - 32 (mEq/L)    Glucose, Bld 105 (*) 70 - 99 (mg/dL)    BUN 10  6 - 23 (mg/dL)    Creatinine, Ser 4.78  0.50 - 1.35 (mg/dL)    Calcium 9.1  8.4 - 10.5 (mg/dL)    GFR calc non Af Amer 72 (*) >90 (mL/min)    GFR calc Af  Amer 83 (*) >90 (mL/min)   CBC     Status: Abnormal   Collection Time   06/19/11  6:00 AM      Component Value Range Comment   WBC 8.4  4.0 - 10.5 (K/uL)    RBC 4.40  4.22 - 5.81 (MIL/uL)    Hemoglobin 13.6  13.0 - 17.0 (g/dL)    HCT 29.5  62.1 - 30.8 (%)    MCV 93.4  78.0 - 100.0 (fL)    MCH 30.9  26.0 - 34.0 (pg)    MCHC 33.1  30.0 - 36.0 (g/dL)    RDW 65.7  84.6 - 96.2 (%)    Platelets 148 (*) 150 - 400 (K/uL)     X-ray Chest Pa And Lateral   06/19/2011  *RADIOLOGY REPORT*  Clinical Data: Fluid overload.  CHEST - 2 VIEW  Comparison: 08/21/2008.  Findings: There is cardiomegaly.  No confluent airspace opacities or effusions.  Scarring in the lingula.  No edema.  No acute bony abnormality.  IMPRESSION: Cardiomegaly.  Lingular scarring.  No active disease.  Original Report Authenticated By: Cyndie Chime, M.D.   Ct Head Wo Contrast  06/18/2011  *RADIOLOGY REPORT*  Clinical Data: Seizure.  Evaluate for bleed.  CT HEAD WITHOUT CONTRAST  Technique:  Contiguous axial images were obtained from the base of the skull through the vertex without contrast.  Comparison: Head CT 08/21/2008.  MRI 107/16/202012.  Findings: Cerebral atrophy is stable.  Small right basilar ganglia lacunar infarction is remote and stable.  The ventricles are stable and within normal limits for size.  Chronic microvascular ischemic changes in the periventricular white matter is unchanged.  There is no hemorrhage, hydrocephalus, mass effect, mass lesion, or evidence of acute cortically based infarction.  Acute and/or chronic sinusitis is seen in the visualized portion of the upper right maxillary sinus, and in several ethmoid air cells. Mastoid air cells are clear.  The skull is intact.  IMPRESSION:  1.  No acute intracranial abnormality. 2.  Chronic microvascular ischemic changes, remote right basal ganglia lacunar infarction, and cerebral atrophy are stable. 3.  Acute and/or chronic sinusitis, partially visualized.  Original Report  Authenticated By: Britta Mccreedy, M.D.   Assessment/Plan: 78 years old man with epilepsy who is non-compliant and new double vision and left eye ptosis for 3 weeks 1) Start Keppra 500 mg PO bid, increase to 750 mg PO bid in 3 days 2) Ecologist for help at home to administer medications 3) MRI brain with thin cuts through brainstem 4) Optho consult 5) Will follow - please put in orders for above  Cru Kritikos 06/19/2011, 9:43 AM

## 2011-06-19 NOTE — ED Notes (Signed)
Report called to [employer], RN

## 2011-06-20 LAB — CBC
MCHC: 33.8 g/dL (ref 30.0–36.0)
Platelets: 168 10*3/uL (ref 150–400)
RDW: 13.5 % (ref 11.5–15.5)
WBC: 7 10*3/uL (ref 4.0–10.5)

## 2011-06-20 LAB — CARDIAC PANEL(CRET KIN+CKTOT+MB+TROPI): Troponin I: <0.3 ng/mL (ref <0.30)

## 2011-06-20 MED ORDER — ASPIRIN 81 MG PO CHEW: 81.0000 mg | CHEWABLE_TABLET | Freq: Every day | ORAL | Status: AC

## 2011-06-20 MED ORDER — LEVETIRACETAM 500 MG PO TABS: 500.0000 mg | ORAL_TABLET | Freq: Two times a day (BID) | ORAL | Status: DC

## 2011-06-20 NOTE — Progress Notes (Signed)
I have seen and examined this patient. I have discussed with Dr Berline Chough.  I agree with their findings and plans as documented in their progress note for today.

## 2011-06-20 NOTE — Consult Note (Addendum)
Subjective: Patient is stable.  Objective: Vital signs in last 24 hours: Temp:  [97.7 F (36.5 C)-98.5 F (36.9 C)] 97.7 F (36.5 C) (01/20 0537) Pulse Rate:  [89-112] 102  (01/20 0537) Resp:  [18-19] 18  (01/20 0537) BP: (90-145)/(52-88) 123/72 mmHg (01/20 0537) SpO2:  [90 %-98 %] 93 % (01/20 0537)  Intake/Output from previous day: 01/19 0701 - 01/20 0700 In: 720 [P.O.:720] Out: 575 [Urine:575]  Nutritional status: Cardiac  Neurological exam: AAO*3. No aphasia. Followed complex commands. Cranial nerves: EOMI, PERRL. Visual fields were full. Sensation to V1 through V3 areas of the face was intact and symmetric throughout. Decreased hearing in both ears bilaterally. There was no facial asymmetry. Hearing to finger rub was equal and symmetrical bilaterally. Shoulder shrug was 5/5 and symmetric bilaterally. Head rotation was 5/5 bilaterally. There was no dysarthria or palatal deviation. Motor: strength was 5/5 and symmetric throughout. Sensory: was intact throughout to light touch, pinprick, vibration and proprioception. Coordination: finger-to-nose were intact and symmetric bilaterally. Reflexes: were 2+ in upper extremities and 1+ at the knees and 1+ at the ankles. Plantar response was downgoing bilaterally. Gait: deferred  Lab Results:  Basename 06/19/11 2333 06/19/11 0600 06/19/11 0148 06/18/11 1703  WBC 7.0 8.4 -- --  HGB 12.6* 13.6 -- --  HCT 37.3* 41.1 -- --  PLT 168 148* -- --  NA -- 141 -- 140  K -- 3.6 -- 3.8  CL -- 104 -- 104  CO2 -- 26 -- 25  GLUCOSE -- 105* -- 146*  BUN -- 10 -- 12  CREATININE -- 0.89 0.99 --  CALCIUM -- 9.1 -- 9.2  LABA1C -- -- -- --   Studies/Results: X-ray Chest Pa And Lateral   06/19/2011  *RADIOLOGY REPORT*  Clinical Data: Fluid overload.  CHEST - 2 VIEW  Comparison: 08/21/2008.  Findings: There is cardiomegaly.  No confluent airspace opacities or effusions.  Scarring in the lingula.  No edema.  No acute bony abnormality.  IMPRESSION:  Cardiomegaly.  Lingular scarring.  No active disease.  Original Report Authenticated By: Cyndie Chime, M.D.   Ct Head Wo Contrast  06/18/2011  *RADIOLOGY REPORT*  Clinical Data: Seizure.  Evaluate for bleed.  CT HEAD WITHOUT CONTRAST  Technique:  Contiguous axial images were obtained from the base of the skull through the vertex without contrast.  Comparison: Head CT 08/21/2008.  MRI 1May 08, 202012.  Findings: Cerebral atrophy is stable.  Small right basilar ganglia lacunar infarction is remote and stable.  The ventricles are stable and within normal limits for size.  Chronic microvascular ischemic changes in the periventricular white matter is unchanged.  There is no hemorrhage, hydrocephalus, mass effect, mass lesion, or evidence of acute cortically based infarction.  Acute and/or chronic sinusitis is seen in the visualized portion of the upper right maxillary sinus, and in several ethmoid air cells. Mastoid air cells are clear.  The skull is intact.  IMPRESSION:  1.  No acute intracranial abnormality. 2.  Chronic microvascular ischemic changes, remote right basal ganglia lacunar infarction, and cerebral atrophy are stable. 3.  Acute and/or chronic sinusitis, partially visualized.  Original Report Authenticated By: Britta Mccreedy, M.D.   Mr Brain Wo Contrast  06/19/2011  *RADIOLOGY REPORT*  Clinical Data: Confusion.  History of epilepsy with poor medication compliance.  Recent double vision with left eye ptosis, evaluated 1May 08, 202012.  Repeat imaging is requested. History of hypertension. History of trigeminal neuralgia.  MRI HEAD WITHOUT CONTRAST  Technique:  Multiplanar, multiecho pulse sequences of the  brain and surrounding structures were obtained according to standard protocol without intravenous contrast.  Comparison: 12020/07/210 most recent MR.  06/18/2011 most recent CT.  Findings: There is a tiny metallic pellet embedded in the patient's scalp along the right pterion.  This casts slight artifact but is  not a danger to the patient.  The patient was moderately uncooperative and could not remain motionless for the study.  Images are suboptimal.  Small or subtle lesions could be overlooked.  There is no evidence for acute infarction, intracranial hemorrhage, mass lesion, hydrocephalus, or extra-axial fluid. Moderate to severe atrophy is present.  Advanced chronic microvascular ischemic change is present in the periventricular and subcortical white matter. Tiny foci of chronic hemorrhage in the right thalamus and left cerebellum reflect  chronic hypertensive cerebrovascular disease.  No midline shift.  Pituitary and cerebellar tonsils unremarkable.  Grossly patent intracranial vasculature based on flow void appearance.  Bilateral cataract extraction.  Moderate sinus disease.  No mastoid fluid.  Compared with priors, there is no significant change.  IMPRESSION: Motion degraded images are suboptimal for evaluation.  No acute intracranial findings.  No visible acute or subacute stroke.  No abnormalities are identified as an intracranial cause of the patient's ptosis.  Original Report Authenticated By: Elsie Stain, M.D.   Medications: I have reviewed the patient's current medications.  Assessment/Plan: 78 years old man with epilepsy who is non-compliant and new double vision and left eye ptosis for 3 weeks 1) MRI again negative for CVA, per family medicine report - optho did not find any abnormalities except reduced vision to explain his symptoms peripherally 2) Myasthenic Panel and follow-up at [facility] 3) SW compliance for medication assistance and compliance issues 4) Keppra 500 mg PO bid and increase to 750 mg PO bid in 2 days 5) Call with questions  LOS: 2 days   Stefani Baik

## 2011-06-20 NOTE — Discharge Summary (Signed)
Physician Discharge Summary  Patient ID: STEFFAN CANIGLIA MRN: [id number] DOB/AGE: 1916-06-28 78 y.o.  Admit date: 06/18/2011 Discharge date: 06/20/2011  Admission Diagnoses: Seizure  Discharge Diagnoses:  Principal Problem:  *Seizures Active Problems:  Hypertension  Weakness  HTN (hypertension)  CAD (coronary artery disease)  Hyperlipidemia   Discharged Condition: stable  Hospital Course: 78 yo male with known seizure disorder taking anticonvulsants inconsistently admitted for prolonged post-ictal state and weakness as well has 3 week history of diplopia. In brief, his hospital problems are as follows: NEURO (Seizures, Hx of ?CVA, Trigeminal Neuralgia, Blurry Vision/Diplopia, Altered Mental Status, Weakness, Fatigue): No recurrent seizures during admission. Consider eliciting event of eye exam as pt reported some aura following bright lights during the exam. Patient had changes in mental status which  Improved. This was likely secondary to post-ictal state with component of acute delirum which resolved. Patient had a head CT on admission which was  Not concerning for Acute Hemorrhagic Stroke, as well as an MRI which was unremarkable. Patient had a prior Work up for CVA which was unrevealing for large watershed stroke however some evidence of HTN micro-lesions. Neurology was consulted and suggestrf starting Keppra as this is the most well tolerated antiseizure med. Tegretol was stopped, and Keppra was started which patient tolerated well. He will continue 500mg  BID for 2 days, then increased to 750mg  BID for continued therapy. For patients diplopia, Dr. Hazle Quant states his outpatient exam was relatively normal. No Opthalmology consult done while patient was in the hospital. Per neuro, a myasthenia panel was ordered which will be followed up as an outpatient. Patient stable at discharge. Social work spoke with patient about compliance. I also spoke with patients wife who also understands discharge  instructions and how important it will be for him to take the new Keppra medication.   2. CARDIOVASCULAR (HTN, Tachycardia, Hx of Carotid Stenosis, PAD, CAD s/p CABG, ?CVA Hx, Hx of Lower GI Bleed): Unknown EF, echo ordered but it was not completed while he was in house. Will recommend for PCP to consider ordering this study. Cardiac enzymes were negative x3. Continued on home antihypertensive regimen of Metoprolol although pt reports not being on any medications currently but has been rx'd in the past. Started on ASA 81, which was continued at discharge but will require close monitoring since patient has history of lower GI bleed.  3. RESP (Reported Hx of OSA): Per report in setting of significant vascular disease may benefit from formal sleep study as OP; last sleep study ~43yrs ago but may not be interested in CPAP; conitinue discussion as OP with PCP. CXR within normal limits.  4. RENAL: Report of ?Hx of Renal Insuffiency - Cr 1.00 today. Consider micro-albumin in setting of significant vascular disease, may benefit from addition of ACEi, if he will be adherent to this therapy.  5. METABOLIC (HLD, isolated elevated CBG): Consider A1C in setting of isolated CBG of 150s on admission. No known history of DM.   Consults: neurology  Significant Diagnostic Studies:  CBC    Component Value Date/Time   WBC 7.0 06/19/2011 2333   RBC 4.02* 06/19/2011 2333   HGB 12.6* 06/19/2011 2333   HCT 37.3* 06/19/2011 2333   PLT 168 06/19/2011 2333   MCV 92.8 06/19/2011 2333   MCH 31.3 06/19/2011 2333   MCHC 33.8 06/19/2011 2333   RDW 13.5 06/19/2011 2333   LYMPHSABS 1.1 08/21/2008 0214   MONOABS 0.3 08/21/2008 0214   EOSABS 0.1 08/21/2008 0214   BASOSABS  0.0 08/21/2008 0214   BMET    Component Value Date/Time   NA 141 06/19/2011 0600   K 3.6 06/19/2011 0600   CL 104 06/19/2011 0600   CO2 26 06/19/2011 0600   GLUCOSE 105* 06/19/2011 0600   BUN 10 06/19/2011 0600   CREATININE 0.89 06/19/2011 0600   CALCIUM 9.1  06/19/2011 0600   GFRNONAA 72* 06/19/2011 0600   GFRAA 83* 06/19/2011 0600   X-ray Chest Pa And Lateral  06/19/2011  Cardiomegaly.  Lingular scarring.  No active disease.   Ct Head Wo Contrast 06/18/2011 MPRESSION:  1.  No acute intracranial abnormality. 2.  Chronic microvascular ischemic changes, remote right basal ganglia lacunar infarction, and cerebral atrophy are stable. 3.  Acute and/or chronic sinusitis, partially visualized.  Original Report Authenticated   Mr Sherrin Daisy Contrast 06/19/2011  Findings: There is a tiny metallic pellet embedded in the patient's scalp along the right pterion.  This casts slight artifact but is not a danger to the patient.  The patient was moderately uncooperative and could not remain motionless for the study.  Images are suboptimal.  Small or subtle lesions could be overlooked.  There is no evidence for acute infarction, intracranial hemorrhage, mass lesion, hydrocephalus, or extra-axial fluid. Moderate to severe atrophy is present.  Advanced chronic microvascular ischemic change is present in the periventricular and subcortical white matter. Tiny foci of chronic hemorrhage in the right thalamus and left cerebellum reflect  chronic hypertensive cerebrovascular disease.  No midline shift.  Pituitary and cerebellar tonsils unremarkable.  Grossly patent intracranial vasculature based on flow void appearance.  Bilateral cataract extraction.  Moderate sinus disease.  No mastoid fluid.  Compared with priors, there is no significant change.  IMPRESSION: Motion degraded images are suboptimal for evaluation.  No acute intracranial findings.  No visible acute or subacute stroke.  No abnormalities are identified as an intracranial cause of the patient's ptosis.   Discharge Exam: Blood pressure 124/72, pulse 106, temperature 98 F (36.7 C), temperature source Oral, resp. rate 19, height 5\' 6"  (1.676 m), weight 182 lb 5.1 oz (82.7 kg), SpO2 95.00%. GENERAL: Elderly Caucasian male  examined in [facility] 6700. In no acute discomfort, no resp distress.  HEENT: AT/[place], slight L sided facial droop with mild ptosis . Moist mucus membranes EYES: conjunctivae/corneas clear. PERRL, EOM's intact.  HEART: RRR, S1/S2 heard, no murmur  LUNGS: CTA B, no wheezes, no crackles  ABDOMEN: +BS, soft, non-tender EXTREMITIES: Moves all 4 extremities spontaneously, warm well perfused, no edema NEURO: Oriented to person, place and date. Answers questions appropriately. Grossly intact.   Recommendations for Follow-up: ** [facility]: Please follow-up on patient's adherence to Keppra, as well as the results from the Myasthenia panel.  - PCP:  - Please consider checking A1C given his elevated CBG on admission. - Also, patient reports not taking any antihypertensives at home. Stable during admission on Metoprolol 25mg  daily. Restarted at discharge. - Started on ASA 81mg . Patient has history of lower GI bleed. Please consider screening for bleeding.   Disposition:    Medication List  As of 06/20/2011 11:43 AM   STOP taking these medications         carbamazepine 200 MG 12 hr tablet         TAKE these medications         aspirin 81 MG chewable tablet   Chew 1 tablet (81 mg total) by mouth daily.      HYDROcodone-acetaminophen 5-500 MG per tablet  Commonly known as: VICODIN   Take 1 tablet by mouth every 4 (four) hours as needed. For pain.      levETIRAcetam 500 MG tablet   Commonly known as: KEPPRA   Take 1 tablet (500 mg total) by mouth 2 (two) times daily. Take one tablet twice daily for 2 days. On Tuesday, start taking 1.5 tablets twice daily.      metoprolol succinate 25 MG 24 hr tablet   Commonly known as: TOPROL-XL   Take 25 mg by mouth daily.           Follow-up Information    Follow up with [facility] . Schedule an appointment as soon as possible for a visit in 2 weeks.   Contact information:   [address] [place]  [place] [address] [phone]      Follow up with Your Primary Care Doctor. Schedule an appointment as soon as possible for a visit in 1 week.         SignedMikel Cella, Jenefer Woerner 06/20/2011, 11:43 AM

## 2011-06-20 NOTE — Discharge Summary (Signed)
I discussed with Dr Hairford.  I agree with their plans documented in their  Note for today.  

## 2011-06-20 NOTE — Progress Notes (Signed)
CSW received consult for med compliance. Met with pt, pt's wife, dtr, and son-in-law. Pt reports no financial or other barriers to obtaining medication and that he "generally takes his meds." CSW reiterated importance of taking medication. Pt and pt's family verbalized understanding of importance surrounding pt's compliance with Keppra. Pt's wife and dtr report they will "make sure he is compliant." No other CSW needs reported or noted. CSW signing off.  Dellie Burns, MSW, [place] [phone]

## 2011-06-22 LAB — STRIATED MUSCLE ANTIBODY: Striated Muscle Ab: <1:40 {titer}

## 2011-06-23 LAB — ACETYLCHOLINE RECEPTOR, BINDING: Acetylcholine Receptor Ab: <0.3 nmol/L (ref <=0.30)

## 2012-04-13 ENCOUNTER — Other Ambulatory Visit: Payer: Self-pay | Admitting: Internal Medicine

## 2012-04-13 DIAGNOSIS — IMO0001 Reserved for inherently not codable concepts without codable children: Secondary | ICD-10-CM

## 2012-04-19 ENCOUNTER — Other Ambulatory Visit: Payer: Medicare Other

## 2012-12-20 ENCOUNTER — Inpatient Hospital Stay (HOSPITAL_COMMUNITY)
Admission: EM | Admit: 2012-12-20 | Discharge: 2012-12-22 | DRG: 066 | Disposition: A | Discharge status: Home / Self Care | Payer: Medicare Other | Attending: Internal Medicine | Admitting: Internal Medicine

## 2012-12-20 ENCOUNTER — Emergency Department (HOSPITAL_COMMUNITY): Payer: Medicare Other

## 2012-12-20 ENCOUNTER — Encounter (HOSPITAL_COMMUNITY): Payer: Self-pay | Admitting: Internal Medicine

## 2012-12-20 DIAGNOSIS — Z79899 Other long term (current) drug therapy: Secondary | ICD-10-CM

## 2012-12-20 DIAGNOSIS — N400 Enlarged prostate without lower urinary tract symptoms: Secondary | ICD-10-CM

## 2012-12-20 DIAGNOSIS — I6350 Cerebral infarction due to unspecified occlusion or stenosis of unspecified cerebral artery: Principal | ICD-10-CM | POA: Diagnosis present

## 2012-12-20 DIAGNOSIS — R4789 Other speech disturbances: Secondary | ICD-10-CM | POA: Diagnosis present

## 2012-12-20 DIAGNOSIS — Z7982 Long term (current) use of aspirin: Secondary | ICD-10-CM

## 2012-12-20 DIAGNOSIS — E785 Hyperlipidemia, unspecified: Secondary | ICD-10-CM | POA: Diagnosis present

## 2012-12-20 DIAGNOSIS — R269 Unspecified abnormalities of gait and mobility: Secondary | ICD-10-CM | POA: Diagnosis present

## 2012-12-20 DIAGNOSIS — R531 Weakness: Secondary | ICD-10-CM

## 2012-12-20 DIAGNOSIS — I2510 Atherosclerotic heart disease of native coronary artery without angina pectoris: Secondary | ICD-10-CM | POA: Diagnosis present

## 2012-12-20 DIAGNOSIS — I639 Cerebral infarction, unspecified: Secondary | ICD-10-CM

## 2012-12-20 DIAGNOSIS — F3289 Other specified depressive episodes: Secondary | ICD-10-CM | POA: Diagnosis present

## 2012-12-20 DIAGNOSIS — R569 Unspecified convulsions: Secondary | ICD-10-CM

## 2012-12-20 DIAGNOSIS — F329 Major depressive disorder, single episode, unspecified: Secondary | ICD-10-CM

## 2012-12-20 DIAGNOSIS — G40909 Epilepsy, unspecified, not intractable, without status epilepticus: Secondary | ICD-10-CM | POA: Diagnosis present

## 2012-12-20 DIAGNOSIS — Z951 Presence of aortocoronary bypass graft: Secondary | ICD-10-CM

## 2012-12-20 DIAGNOSIS — H9190 Unspecified hearing loss, unspecified ear: Secondary | ICD-10-CM | POA: Diagnosis present

## 2012-12-20 DIAGNOSIS — N289 Disorder of kidney and ureter, unspecified: Secondary | ICD-10-CM

## 2012-12-20 DIAGNOSIS — F411 Generalized anxiety disorder: Secondary | ICD-10-CM | POA: Diagnosis present

## 2012-12-20 DIAGNOSIS — E875 Hyperkalemia: Secondary | ICD-10-CM | POA: Diagnosis present

## 2012-12-20 DIAGNOSIS — I10 Essential (primary) hypertension: Secondary | ICD-10-CM | POA: Diagnosis present

## 2012-12-20 LAB — URINALYSIS, ROUTINE W REFLEX MICROSCOPIC
Bilirubin Urine: NEGATIVE
Glucose, UA: NEGATIVE mg/dL
Hgb urine dipstick: NEGATIVE
Ketones, ur: NEGATIVE mg/dL
Leukocytes, UA: NEGATIVE
Nitrite: NEGATIVE
Protein, ur: NEGATIVE mg/dL
Specific Gravity, Urine: 1.023 (ref 1.005–1.030)
Urobilinogen, UA: 0.2 mg/dL (ref 0.0–1.0)
pH: 5.5 (ref 5.0–8.0)

## 2012-12-20 LAB — BASIC METABOLIC PANEL
BUN: 16 mg/dL (ref 6–23)
CO2: 32 mEq/L (ref 19–32)
Calcium: 9.1 mg/dL (ref 8.4–10.5)
Chloride: 105 mEq/L (ref 96–112)
Creatinine, Ser: 1.26 mg/dL (ref 0.50–1.35)
GFR calc Af Amer: 54 mL/min — ABNORMAL LOW (ref >90)
GFR calc non Af Amer: 47 mL/min — ABNORMAL LOW (ref >90)
Glucose, Bld: 102 mg/dL — ABNORMAL HIGH (ref 70–99)
Potassium: 5.3 mEq/L — ABNORMAL HIGH (ref 3.5–5.1)
Sodium: 141 mEq/L (ref 135–145)

## 2012-12-20 LAB — CBC WITH DIFFERENTIAL/PLATELET
Basophils Absolute: 0 10*3/uL (ref 0.0–0.1)
Basophils Relative: 1 % (ref 0–1)
Eosinophils Absolute: 0.3 10*3/uL (ref 0.0–0.7)
Eosinophils Relative: 4 % (ref 0–5)
HCT: 40 % (ref 39.0–52.0)
Hemoglobin: 13.7 g/dL (ref 13.0–17.0)
Lymphocytes Relative: 28 % (ref 12–46)
Lymphs Abs: 2.3 10*3/uL (ref 0.7–4.0)
MCH: 32.3 pg (ref 26.0–34.0)
MCHC: 34.3 g/dL (ref 30.0–36.0)
MCV: 94.3 fL (ref 78.0–100.0)
Monocytes Absolute: 0.5 10*3/uL (ref 0.1–1.0)
Monocytes Relative: 7 % (ref 3–12)
Neutro Abs: 5 10*3/uL (ref 1.7–7.7)
Neutrophils Relative %: 61 % (ref 43–77)
Platelets: 170 10*3/uL (ref 150–400)
RBC: 4.24 MIL/uL (ref 4.22–5.81)
RDW: 13.3 % (ref 11.5–15.5)
WBC: 8.2 10*3/uL (ref 4.0–10.5)

## 2012-12-20 LAB — TROPONIN I: Troponin I: <0.3 ng/mL (ref <0.30)

## 2012-12-20 MED ORDER — ACETAMINOPHEN 325 MG PO TABS: 650.0000 mg | ORAL_TABLET | ORAL | Status: DC | PRN

## 2012-12-20 MED ORDER — ASPIRIN 81 MG PO CHEW
324.0000 mg | CHEWABLE_TABLET | Freq: Once | ORAL | Status: AC
  Administered 2012-12-20: 324 mg via ORAL
  Filled 2012-12-20: qty 4

## 2012-12-20 MED ORDER — ASPIRIN 325 MG PO TABS
325.0000 mg | ORAL_TABLET | Freq: Every day | ORAL | Status: DC
  Administered 2012-12-20 – 2012-12-22 (×3): 325 mg via ORAL
  Filled 2012-12-20 (×3): qty 1

## 2012-12-20 MED ORDER — METOPROLOL SUCCINATE ER 25 MG PO TB24
25.0000 mg | ORAL_TABLET | Freq: Every day | ORAL | Status: DC
  Administered 2012-12-22: 25 mg via ORAL
  Filled 2012-12-20 (×2): qty 1

## 2012-12-20 MED ORDER — SERTRALINE HCL 25 MG PO TABS
25.0000 mg | ORAL_TABLET | Freq: Every day | ORAL | Status: DC
  Administered 2012-12-21 – 2012-12-22 (×2): 25 mg via ORAL
  Filled 2012-12-20 (×2): qty 1

## 2012-12-20 MED ORDER — ASPIRIN 300 MG RE SUPP
300.0000 mg | Freq: Every day | RECTAL | Status: DC
  Filled 2012-12-20 (×3): qty 1

## 2012-12-20 MED ORDER — LEVETIRACETAM 500 MG PO TABS
500.0000 mg | ORAL_TABLET | Freq: Two times a day (BID) | ORAL | Status: DC
  Administered 2012-12-20 – 2012-12-22 (×4): 500 mg via ORAL
  Filled 2012-12-20 (×5): qty 1

## 2012-12-20 MED ORDER — ACETAMINOPHEN 650 MG RE SUPP: 650.0000 mg | RECTAL | Status: DC | PRN

## 2012-12-20 MED ORDER — ENOXAPARIN SODIUM 40 MG/0.4ML ~~LOC~~ SOLN
40.0000 mg | SUBCUTANEOUS | Status: DC
  Administered 2012-12-20 – 2012-12-21 (×2): 40 mg via SUBCUTANEOUS
  Filled 2012-12-20 (×3): qty 0.4

## 2012-12-20 NOTE — ED Notes (Signed)
Pt given [place] sandwich and diet cole

## 2012-12-20 NOTE — ED Notes (Signed)
Report to Jordan

## 2012-12-20 NOTE — Progress Notes (Signed)
Per EPIC [facility] admitting provider is Dr. Lendell Caprice.  Paged MD upon pts arrival to 4N23. Still awaiting call back / new orders. Will place pt on telemetry since he is here for stroke symptoms. Will continue to monitor. Vayda Dungee, [place] Marie, RN

## 2012-12-20 NOTE — H&P (Signed)
[facility] History and Physical  Larry Bradford ZOX:[id number] DOB: March 21, 1917 DOA: 12/20/2012  Referring physician: Juleen China PCP: Georgianne Fick, MD   Chief Complaint: slurred speech and difficulty walking  HPI: Larry Bradford is a 79 y.o. male with difficulty walking for several days. Slurred speech.  Patient noticed difficulty walking a week ago.   2 days ago, fell and broke a pot.  Pt amnestic to event. Wife reports confusion "out of it".  Patient has seemed to have a left-sided facial droop as well.  He states that he does feel weak but denies any focality. H/o seizure, but no witnessed seizure activity this week.  Review of Systems: The patient denies anorexia, fever, weight loss,, vision loss, hard of hearing, hoarseness, chest pain, , dyspnea on exertion, peripheral edema, balance deficits, hemoptysis, abdominal pain, melena, hematochezia, severe indigestion/heartburn, hematuria, incontinence, genital sores, muscle weakness, suspicious skin lesions, transient blindness, has been very depressed and anxious unusual weight change, abnormal bleeding, enlarged lymph nodes, angioedema  Past Medical History  Diagnosis Date  . Seizures     Onset in Sept 2006  . Cataract   . Hard of hearing   . Renal disorder   . Depression   . HTN (hypertension)   . BPH (benign prostatic hyperplasia)   . CAD (coronary artery disease)     s/p CABG in 2002  . Hyperlipidemia   . OSA (obstructive sleep apnea)   . Aortic aneurysm     3 CM in 2007  . Carotid stenosis   . DDD (degenerative disc disease)   . Trigeminal neuralgia   . Lower GI bleed    Past Surgical History  Procedure Laterality Date  . Coronary artery bypass graft  2002  . Temporal artery biopsy / ligation  2004   Social History:  reports that he has never smoked. He does not have any smokeless tobacco history on file. He reports that he does not drink alcohol. His drug history is not on file.  No Known Allergies  Family  History  Problem Relation Age of Onset  . CAD Brother    Prior to Admission medications   Medication Sig Start Date End Date Taking? Authorizing Provider  aspirin 81 MG tablet Take 81 mg by mouth daily.   Yes Historical Provider, MD  levETIRAcetam (KEPPRA) 500 MG tablet Take 500 mg by mouth every 12 (twelve) hours.   Yes Historical Provider, MD  metoprolol succinate (TOPROL-XL) 100 MG 24 hr tablet Take 100 mg by mouth daily. Take with or immediately following a meal.   Yes Historical Provider, MD   Physical Exam: Filed Vitals:   12/20/12 1239 12/20/12 1315 12/20/12 1558  BP: 146/59 141/68   Pulse: 52 47   Temp:   98.2 F (36.8 C)  SpO2: 99% 94%    BP 129/35  Pulse 51  Temp(Src) 97.5 F (36.4 C) (Oral)  Resp 14  Ht 5\' 6"  (1.676 m)  Wt 81.647 kg (180 lb)  BMI 29.07 kg/m2  SpO2 98%  General Appearance:    Alert, cooperative, no distress, appears stated age  Head:    Normocephalic, without obvious abnormality, atraumatic  Eyes:    PERRL, conjunctiva/corneas clear, EOM's intact, fundi    benign, both eyes  Ears:    Hard of hearing  Nose:   Nares normal, septum midline, mucosa normal, no drainage    or sinus tenderness  Throat:   Lips, mucosa, and tongue normal; teeth and gums normal  Neck:   Supple,  symmetrical, trachea midline, no adenopathy;    thyroid:  no enlargement/tenderness/nodules; no carotid   bruit or JVD  Back:     Symmetric, no curvature, ROM normal, no CVA tenderness  Lungs:     Clear to auscultation bilaterally, respirations unlabored  Chest Wall:    No tenderness or deformity   Heart:    Regular rate and rhythm, S1 and S2 normal, no murmur, rub   or gallop     Abdomen:     Soft, non-tender, bowel sounds active all four quadrants,    no masses, no organomegaly  Genitalia:    deferred  Rectal:    deferred  Extremities:   Extremities normal, atraumatic, no cyanosis or edema  Pulses:   2+ and symmetric all extremities  Skin:   Skin color, texture, turgor  normal, no rashes or lesions  Lymph nodes:   Cervical, supraclavicular, and axillary nodes normal  Neurologic:   CNII-XII intact, normal strength, sensation and reflexes    Throughout. Speech slurred but fluent. Normal finger to nose. Did not test gait    Psychiatric: calm normal affect  Labs on Admission:  Basic Metabolic Panel:  Recent Labs Lab 12/20/12 1229  NA 141  K 5.3*  CL 105  CO2 32  GLUCOSE 102*  BUN 16  CREATININE 1.26  CALCIUM 9.1   Liver Function Tests: No results found for this basename: AST, ALT, ALKPHOS, BILITOT, PROT, ALBUMIN,  in the last 168 hours No results found for this basename: LIPASE, AMYLASE,  in the last 168 hours No results found for this basename: AMMONIA,  in the last 168 hours CBC:  Recent Labs Lab 12/20/12 1229  WBC 8.2  NEUTROABS 5.0  HGB 13.7  HCT 40.0  MCV 94.3  PLT 170   Cardiac Enzymes:  Recent Labs Lab 12/20/12 1229  TROPONINI <0.30    BNP (last 3 results) No results found for this basename: PROBNP,  in the last 8760 hours CBG: No results found for this basename: GLUCAP,  in the last 168 hours  Radiological Exams on Admission: Ct Head Wo Contrast  12/20/2012   *RADIOLOGY REPORT*  Clinical Data: Status post fall 2 days ago.  CT HEAD WITHOUT CONTRAST  Technique:  Contiguous axial images were obtained from the base of the skull through the vertex without contrast.  Comparison: Head CT scan 04/17/2012 and brain MRI 04/18/2012.  Findings: Atrophy and extensive chronic microvascular ischemic change are again seen.  No evidence of acute abnormality including infarction, hemorrhage, mass lesion, mass effect, midline shift or abnormal extra-axial fluid collection is identified.  Mucous retention cyst or polyp in the right maxillary sinus is noted.  The calvarium is intact.  IMPRESSION: No acute finding.  Atrophy and extensive chronic microvascular ischemic change.   Original Report Authenticated By: Holley Dexter, M.D.   Dg Chest  Portable 1 View  12/20/2012   *RADIOLOGY REPORT*  Clinical Data: Weakness, bradycardia  PORTABLE CHEST - 1 VIEW  Comparison: 10/26/2011  Findings: Evidence of CABG.  Mild prominence the cardiac silhouette may be in part projectional, without evidence for edema.  No pleural effusion.  Partial omission of the left costophrenic angle from the field of view.  No pneumothorax.  Nodular opacity over the right anterior first rib is noted, compatible with superimposition of normal structures as seen on similar prior exam 06/19/2011.  No focal pulmonary opacity.  IMPRESSION: No acute cardiopulmonary process.   Original Report Authenticated By: Christiana Pellant, M.D.    EKG: not  in Epic  Assessment/Plan  Slurred speech and gait disturbance:  Likely subacute CVA. Will order workup.  Also, some confusion and fall/amnesia: could be unwitnessed seizure/postictal.  Will get EEG as well.  Patient does not take ASA 81 mg every day.  Forgets.  Will give ASA 325 mg for now.  Telemetry. PT/OT/ST for language   Hypertension   Seizures on keppra   Hard of hearing   Depression and anxiety:  Would like to try SSRI.  Will start lexapro low dose.   CAD (coronary artery disease)   Hyperkalemia:  Repeat in am  Code Status: full Family Communication: wife and daughter at bedside Disposition Plan: probably home  Time spent: 31  Christiane Ha [facility] Pager [phone]  If 7PM-7AM, please contact night-coverage [email or web address] Password [facility] 12/20/2012, 6:45 PM

## 2012-12-20 NOTE — Progress Notes (Signed)
Dr. Sullivan at bedside

## 2012-12-20 NOTE — ED Provider Notes (Signed)
History    89yM accompanied by wife. CC of generalized weakness. Wife first noticed change two days ago. Wife heard a crash from another room and found pt standing next to a broken pot.  Patient is unsure of the exact circumstances of how it broke. Patient has not been his normal self since about that time. His voice has sounded "coarse" per his wife. He is weak to the point of being extremely unsteady when he ambulates. Patient has seemed to have a left-sided facial droop as well. Since symptoms were first noticed they have been stable. Patient acknowledges his wife's concerns. He states that he does feel weak but denies any focality. His agrees his voice sounds different and that his face feels different to him. He does any pain anywhere. Hx of seizure, but no recent activity. No acute visual complaints. No fever or chills. No urinary complaints. Reports compliance with medications.   CSN: [id number] Arrival date & time 12/20/12  1122  First MD Initiated Contact with Patient 12/20/12 1141     Chief Complaint  Patient presents with  . Weakness   (Consider location/radiation/quality/duration/timing/severity/associated sxs/prior Treatment) HPI Past Medical History  Diagnosis Date  . Seizures     Onset in Sept 2006  . Cataract   . Hard of hearing   . Renal disorder   . Depression   . HTN (hypertension)   . BPH (benign prostatic hyperplasia)   . CAD (coronary artery disease)     s/p CABG in 2002  . Hyperlipidemia   . OSA (obstructive sleep apnea)   . Aortic aneurysm     3 CM in 2007  . Carotid stenosis   . DDD (degenerative disc disease)   . Trigeminal neuralgia   . Lower GI bleed    Past Surgical History  Procedure Laterality Date  . Coronary artery bypass graft  2002  . Temporal artery biopsy / ligation  2004   No family history on file. History  Substance Use Topics  . Smoking status: Never Smoker   . Smokeless tobacco: Not on file  . Alcohol Use: No    Review of  Systems  Allergies  Review of patient's allergies indicates no known allergies.  Home Medications   Current Outpatient Rx  Name  Route  Sig  Dispense  Refill  . aspirin 81 MG tablet   Oral   Take 81 mg by mouth daily.         Marland Kitchen levETIRAcetam (KEPPRA) 500 MG tablet   Oral   Take 500 mg by mouth every 12 (twelve) hours.         . metoprolol succinate (TOPROL-XL) 100 MG 24 hr tablet   Oral   Take 100 mg by mouth daily. Take with or immediately following a meal.          BP 141/68  Pulse 47  SpO2 94% Physical Exam  Nursing note and vitals reviewed. Constitutional: He appears well-developed and well-nourished. No distress.  HENT:  Head: Normocephalic and atraumatic.  Eyes: Conjunctivae are normal. Right eye exhibits no discharge. Left eye exhibits no discharge.  Mild anisocoria. ~37mm OS, ~58mm OD. Both reactive to light directly and consensually.    Neck: Neck supple.  Cardiovascular: Normal rate, regular rhythm and normal heart sounds.  Exam reveals no gallop and no friction rub.   No murmur heard. Pulmonary/Chest: Effort normal and breath sounds normal. No respiratory distress.  Abdominal: Soft. He exhibits no distension. There is no tenderness.  Musculoskeletal:  He exhibits no edema and no tenderness.  Neurological: He is alert.  Hard of hearing. Speech mildly dysarthric but understandable. Content appropriate. Mild L facial droop. Global weakness. Strength 4/5 b/l upper/lower extremities. Finger to nose testing with slow, deliberate movements but no past pointing. Difficulty with assessment of visual fields as pt repeatedly averts gaze.   Skin: Skin is warm and dry.  Psychiatric: He has a normal mood and affect. His behavior is normal. Thought content normal.    ED Course  Procedures (including critical care time) Labs Reviewed  BASIC METABOLIC PANEL - Abnormal; Notable for the following:    Potassium 5.3 (*)    Glucose, Bld 102 (*)    GFR calc non Af Amer 47 (*)     GFR calc Af Amer 54 (*)    All other components within normal limits  CBC WITH DIFFERENTIAL  TROPONIN I  URINALYSIS, ROUTINE W REFLEX MICROSCOPIC   Ct Head Wo Contrast  12/20/2012   *RADIOLOGY REPORT*  Clinical Data: Status post fall 2 days ago.  CT HEAD WITHOUT CONTRAST  Technique:  Contiguous axial images were obtained from the base of the skull through the vertex without contrast.  Comparison: Head CT scan 04/17/2012 and brain MRI 04/18/2012.  Findings: Atrophy and extensive chronic microvascular ischemic change are again seen.  No evidence of acute abnormality including infarction, hemorrhage, mass lesion, mass effect, midline shift or abnormal extra-axial fluid collection is identified.  Mucous retention cyst or polyp in the right maxillary sinus is noted.  The calvarium is intact.  IMPRESSION: No acute finding.  Atrophy and extensive chronic microvascular ischemic change.   Original Report Authenticated By: Holley Dexter, M.D.  EKG:  Rhythm: sinus bradycardia Rate: 53 RBBB ST segments: NS ST changes across precordium Comparison: fairly stable from 06/2011   1. Acute CVA (cerebrovascular accident)     MDM  89yM with stroke symptoms. Not TPA candidate with onset of symptoms ~48 hours ago. Ct w/o acute findings. Needs admit for further eval.   Raeford Razor, MD 12/20/12 1539

## 2012-12-20 NOTE — ED Notes (Signed)
Per EMS pt fell at home 2 days ago due to weakness.  Pt has no memory of fall. Pt wife reported that pt weakness has been getting worse since fall.  Pt seen today by MD due to increased weakness and unsteady gait.  Unknown HX.  Pt co no pain. EMS vitals 130/60 70 hr  97% ra.  Pt hard of hearing.  Pt MD thinks he had a stroke 2 days ago.  Pt alert oriented X4

## 2012-12-21 ENCOUNTER — Inpatient Hospital Stay (HOSPITAL_COMMUNITY): Payer: Medicare Other

## 2012-12-21 DIAGNOSIS — I359 Nonrheumatic aortic valve disorder, unspecified: Secondary | ICD-10-CM

## 2012-12-21 DIAGNOSIS — E875 Hyperkalemia: Secondary | ICD-10-CM

## 2012-12-21 DIAGNOSIS — R5383 Other fatigue: Secondary | ICD-10-CM

## 2012-12-21 LAB — BASIC METABOLIC PANEL
BUN: 17 mg/dL (ref 6–23)
Calcium: 9 mg/dL (ref 8.4–10.5)
GFR calc Af Amer: 62 mL/min — ABNORMAL LOW (ref >90)
GFR calc non Af Amer: 54 mL/min — ABNORMAL LOW (ref >90)
Potassium: 4.3 mEq/L (ref 3.5–5.1)

## 2012-12-21 LAB — HEMOGLOBIN A1C: Hgb A1c MFr Bld: 5.6 % (ref <5.7)

## 2012-12-21 LAB — LIPID PANEL: HDL: 35 mg/dL — ABNORMAL LOW (ref >39)

## 2012-12-21 NOTE — Progress Notes (Signed)
OT Cancellation Note  Patient Details Name: Larry Bradford MRN: [id number] DOB: 05-Jan-1917   Cancelled Treatment:    Reason Eval/Treat Not Completed: Patient at procedure or test/ unavailable (off floor for EEG)  Lucile Shutters Pager: [phone]  12/21/2012, 8:47 AM

## 2012-12-21 NOTE — Progress Notes (Signed)
   CARE MANAGEMENT NOTE 12/21/2012  Patient:  Larry Bradford, Larry Bradford   Account Number:  [id number]  Date Initiated:  12/21/2012  Documentation initiated by:  Jiles Crocker  Subjective/Objective Assessment:   ADMITTED WITH slurred speech and difficulty walking     Action/Plan:   PCP: Georgianne Fick, MD  LIVES AT HOME WITH SPOUSE/ CM FOLLOWING FOR DCP; AWAITING FOR PT/OT EVALS FOR DISPOSITION   Anticipated DC Date:  12/26/2012   Anticipated DC Plan:  POSSIBLY HOME W HOME HEALTH SERVICES      DC Planning Services  CM consult              Status of service:  In process, will continue to follow Medicare Important Message given?  NA - LOS <3 / Initial given by admissions (If response is "NO", the following Medicare IM given date fields will be blank)  Per UR Regulation:  Reviewed for med. necessity/level of care/duration of stay  Comments:  12/21/2012- B Tujuana Kilmartin RN,BSN,MHA

## 2012-12-21 NOTE — Procedures (Signed)
ELECTROENCEPHALOGRAM REPORT   Patient: Larry Bradford       Room #: 1O10 EEG No. ID: [id number] Age: 79 y.o.        Sex: male Referring Physician: Verner Mould Report Date:  12/21/2012        Interpreting Physician: Aline Brochure  History: Larry Bradford is an 79 y.o. male admitted for slurred speech, confusion and difficulty walking.    Indications for study:  Rule out encephalopathy; rule out new-onset partial seizure disorder.  Technique: This is an 18 channel routine scalp EEG performed at the bedside with bipolar and monopolar montages arranged in accordance to the international 10/20 system of electrode placement.   Description: EEG recording was performed during wakefulness and during drowsiness. Predominant background activity during wakefulness consisted of 8-9 Hz symmetric alpha rhythm. Photic stimulation was not performed. Hyperventilation was not performed. During drowsiness there was mild slowing of background activity with irregular delta and theta activity diffusely. Patient did not [place] achieve stage II of sleep at any point during the recording. No epileptiform discharges were recorded. There were no areas of abnormal swelling.  Interpretation: This is a normal EEG recording during wakefulness and during drowsiness.  Venetia Maxon M.D. [facility] [phone]

## 2012-12-21 NOTE — Progress Notes (Signed)
  Echocardiogram 2D Echocardiogram has been performed.  Larry Bradford 12/21/2012, 11:11 AM

## 2012-12-21 NOTE — Progress Notes (Signed)
PT Cancellation Note  Patient Details Name: Larry Bradford MRN: [id number] DOB: 07-24-1916   Cancelled Treatment:    Reason Eval/Treat Not Completed: Patient at procedure or test/unavailable--MRI   Chaselynn Kepple 12/21/2012, 11:45 AM Pager [phone]

## 2012-12-21 NOTE — Progress Notes (Signed)
*  PRELIMINARY RESULTS* Vascular Ultrasound Carotid Duplex (Doppler) has been completed.  Preliminary findings: . Right = 40-59% ICA stenosis. Based on moderate calcific plaque and ICA/CCA ratio stenosis appears to be more severe.  Left = <39% ICA stenosis.  Vertebral artery flow is antegrade.      Farrel Demark, RDMS, RVT  12/21/2012, 11:06 AM

## 2012-12-21 NOTE — Progress Notes (Addendum)
[facility] PROGRESS NOTE  Larry Bradford WJX:[id number] DOB: Apr 16, 1917 DOA: 12/20/2012 PCP: Georgianne Fick, MD  Assessment/Plan: Slurred speech and gait disturbance: Likely subacute CVA. Will order MRI/echo. Also, some confusion and fall/amnesia: could be unwitnessed seizure/postictal. Will get EEG as well. Patient does not take ASA 81 mg every day. Forgets. Will give ASA 325 mg for now. Telemetry. PT/OT/ST for language   Hypertension   Seizures on keppra   Hard of hearing   Depression and anxiety: Would like to try SSRI. Will start lexapro low dose.  CAD (coronary artery disease)   Hyperkalemia: resolved   Code Status: full Family Communication: patient at bedside Disposition Plan:    Consultants:    Procedures:  Antibiotics:    HPI/Subjective: Up eating breakfast-no complaints No fever, no chills No weakness  Objective: Filed Vitals:   12/21/12 0051 12/21/12 0201 12/21/12 0415 12/21/12 0617  BP: 125/39 117/38 111/55 126/41  Pulse: 53 52 50 50  Temp: 97.6 F (36.4 C) 97.8 F (36.6 C) 98.1 F (36.7 C) 98 F (36.7 C)  TempSrc: Oral Oral Oral Oral  Resp: 16 18 18 18   Height:      Weight:      SpO2: 95% 97% 96% 97%   No intake or output data in the 24 hours ending 12/21/12 1004 Filed Weights   12/20/12 2003  Weight: 81.647 kg (180 lb)    Exam:   General:  Pleasant/cooperative  Cardiovascular: rrr  Respiratory: clear anterior       Abdomen: +BS,s oft, NT  Musculoskeletal: moves all 4 ext   Data Reviewed: Basic Metabolic Panel:  Recent Labs Lab 12/20/12 1229 12/21/12 0645  NA 141 141  K 5.3* 4.3  CL 105 106  CO2 32 26  GLUCOSE 102* 102*  BUN 16 17  CREATININE 1.26 1.13  CALCIUM 9.1 9.0   Liver Function Tests: No results found for this basename: AST, ALT, ALKPHOS, BILITOT, PROT, ALBUMIN,  in the last 168 hours No results found for this basename: LIPASE, AMYLASE,  in the last 168 hours No results found for this  basename: AMMONIA,  in the last 168 hours CBC:  Recent Labs Lab 12/20/12 1229  WBC 8.2  NEUTROABS 5.0  HGB 13.7  HCT 40.0  MCV 94.3  PLT 170   Cardiac Enzymes:  Recent Labs Lab 12/20/12 1229  TROPONINI <0.30   BNP (last 3 results) No results found for this basename: PROBNP,  in the last 8760 hours CBG: No results found for this basename: GLUCAP,  in the last 168 hours  No results found for this or any previous visit (from the past 240 hour(s)).   Studies: Ct Head Wo Contrast  12/20/2012   *RADIOLOGY REPORT*  Clinical Data: Status post fall 2 days ago.  CT HEAD WITHOUT CONTRAST  Technique:  Contiguous axial images were obtained from the base of the skull through the vertex without contrast.  Comparison: Head CT scan 04/17/2012 and brain MRI 04/18/2012.  Findings: Atrophy and extensive chronic microvascular ischemic change are again seen.  No evidence of acute abnormality including infarction, hemorrhage, mass lesion, mass effect, midline shift or abnormal extra-axial fluid collection is identified.  Mucous retention cyst or polyp in the right maxillary sinus is noted.  The calvarium is intact.  IMPRESSION: No acute finding.  Atrophy and extensive chronic microvascular ischemic change.   Original Report Authenticated By: Holley Dexter, M.D.   Dg Chest Portable 1 View  12/20/2012   *RADIOLOGY REPORT*  Clinical Data: Weakness,  bradycardia  PORTABLE CHEST - 1 VIEW  Comparison: 10/26/2011  Findings: Evidence of CABG.  Mild prominence the cardiac silhouette may be in part projectional, without evidence for edema.  No pleural effusion.  Partial omission of the left costophrenic angle from the field of view.  No pneumothorax.  Nodular opacity over the right anterior first rib is noted, compatible with superimposition of normal structures as seen on similar prior exam 06/19/2011.  No focal pulmonary opacity.  IMPRESSION: No acute cardiopulmonary process.   Original Report Authenticated By:  Christiana Pellant, M.D.    Scheduled Meds: . aspirin  300 mg Rectal Daily   Or  . aspirin  325 mg Oral Daily  . enoxaparin (LOVENOX) injection  40 mg Subcutaneous Q24H  . levETIRAcetam  500 mg Oral Q12H  . metoprolol succinate  25 mg Oral Daily  . sertraline  25 mg Oral Daily   Continuous Infusions:   Principal Problem:   Acute ischemic stroke Active Problems:   Hypertension   Seizures   Hard of hearing   Depression   CAD (coronary artery disease)   Hyperkalemia    Time spent: 35    [facility], Moe Brier  [facility] Pager [phone]. If 7PM-7AM, please contact night-coverage at [email or web address], password [facility] 12/21/2012, 10:04 AM  LOS: 1 day

## 2012-12-21 NOTE — Consult Note (Signed)
NEURO HOSPITALIST CONSULT NOTE    Reason for Consult: seizure  HPI:                                                                                                                                          Larry Bradford is an 79 y.o. male who was brought to hospital due to gait difficulty confusion and slurred speech that has been persistent for multiple days. MRI of brain was obtained while in hospital that showed a small left thalamic infarct and MRA showed multiple bilateral areas of intracranial atherosclerosis. Carotid dopplers show right 40-59% ICA stenosis and <39% stenosis of left ICA. Echo is pending. Patient has history of seizure bbut has had no seizures while hospitalized. Currently patient is alert and oriented to time, date, year and month--when asked about recent event of stroke he cannot give me a great history but feels this may have going on for a week.  When asked if he takes ASA at home he denies this but it is on his med rec.   Past Medical History  Diagnosis Date  . Seizures     Onset in Sept 2006  . Cataract   . Hard of hearing   . Renal disorder   . Depression   . HTN (hypertension)   . BPH (benign prostatic hyperplasia)   . CAD (coronary artery disease)     s/p CABG in 2002  . Hyperlipidemia   . OSA (obstructive sleep apnea)   . Aortic aneurysm     3 CM in 2007  . Carotid stenosis   . DDD (degenerative disc disease)   . Trigeminal neuralgia   . Lower GI bleed     Past Surgical History  Procedure Laterality Date  . Coronary artery bypass graft  2002  . Temporal artery biopsy / ligation  2004    Family History  Problem Relation Age of Onset  . CAD Brother      Social History:  reports that he has never smoked. He does not have any smokeless tobacco history on file. He reports that he does not drink alcohol. His drug history is not on file.  No Known Allergies  MEDICATIONS:  Prior to Admission:  Prescriptions prior to admission  Medication Sig Dispense Refill  . aspirin 81 MG tablet Take 81 mg by mouth daily.      Marland Kitchen levETIRAcetam (KEPPRA) 500 MG tablet Take 500 mg by mouth every 12 (twelve) hours.      . metoprolol succinate (TOPROL-XL) 100 MG 24 hr tablet Take 100 mg by mouth daily. Take with or immediately following a meal.       Scheduled: . aspirin  300 mg Rectal Daily   Or  . aspirin  325 mg Oral Daily  . enoxaparin (LOVENOX) injection  40 mg Subcutaneous Q24H  . levETIRAcetam  500 mg Oral Q12H  . metoprolol succinate  25 mg Oral Daily  . sertraline  25 mg Oral Daily     ROS:                                                                                                                                       History obtained from the patient  General ROS: negative for - chills, fatigue, fever, night sweats, weight gain or weight loss Psychological ROS: negative for - behavioral disorder, hallucinations, memory difficulties, mood swings or suicidal ideation Ophthalmic ROS: negative for - blurry vision, double vision, eye pain or loss of vision ENT ROS: negative for - epistaxis, nasal discharge, oral lesions, sore throat, tinnitus or vertigo Allergy and Immunology ROS: negative for - hives or itchy/watery eyes Hematological and Lymphatic ROS: negative for - bleeding problems, bruising or swollen lymph nodes Endocrine ROS: negative for - galactorrhea, hair pattern changes, polydipsia/polyuria or temperature intolerance Respiratory ROS: negative for - cough, hemoptysis, shortness of breath or wheezing Cardiovascular ROS: negative for - chest pain, dyspnea on exertion, edema or irregular heartbeat Gastrointestinal ROS: negative for - abdominal pain, diarrhea, hematemesis, nausea/vomiting or stool incontinence Genito-Urinary ROS: negative for - dysuria, hematuria, incontinence or urinary  frequency/urgency Musculoskeletal ROS: negative for - joint swelling or muscular weakness Neurological ROS: as noted in HPI Dermatological ROS: negative for rash and skin lesion changes   Blood pressure 129/92, pulse 53, temperature 97.8 F (36.6 C), temperature source Oral, resp. rate 20, height 5\' 6"  (1.676 m), weight 180 lb (81.647 kg), SpO2 97.00%.   Neurologic Examination:                                                                                                      Mental Status: Alert, oriented, thought content appropriate.  Speech fluent without evidence of aphasia.  Able to follow 3 step commands without difficulty. Cranial Nerves: II: Discs flat bilaterally; Visual fields grossly normal, pupils equal, round, reactive to light and accommodation III,IV, VI: ptosis not present, extra-ocular motions intact bilaterally V,VII: smile symmetric, slight right NL fold decrease. facial light touch sensation normal bilaterally VIII: hearing decreased bilaterally IX,X: gag reflex present XI: bilateral shoulder shrug XII: midline tongue extension Motor: Right : Upper extremity   5/5    Left:     Upper extremity   5/5  Lower extremity   5/5     Lower extremity   5/5 Tone and bulk:normal tone throughout; no atrophy noted Sensory: Pinprick and light touch intact throughout, bilaterally and denies any decreased sensation.  Deep Tendon Reflexes:  Right: Upper Extremity   Left: Upper extremity   biceps (C-5 to C-6) 2/4   biceps (C-5 to C-6) 2/4 tricep (C7) 2/4    triceps (C7) 2/4 Brachioradialis (C6) 2/4  Brachioradialis (C6) 2/4  Lower Extremity Lower Extremity  quadriceps (L-2 to L-4) 0/4   quadriceps (L-2 to L-4) 0/4 Achilles (S1) 0/4   Achilles (S1) 0/4  Plantars: Right: downgoing   Left: downgoing Cerebellar: normal finger-to-nose,  normal heel-to-shin test CV: pulses palpable throughout    Lab Results  Component Value Date/Time   CHOL 156 12/21/2012  6:45 AM     Results for orders placed during the hospital encounter of 12/20/12 (from the past 48 hour(s))  CBC WITH DIFFERENTIAL     Status: None   Collection Time    12/20/12 12:29 PM      Result Value Range   WBC 8.2  4.0 - 10.5 K/uL   RBC 4.24  4.22 - 5.81 MIL/uL   Hemoglobin 13.7  13.0 - 17.0 g/dL   HCT 16.1  09.6 - 04.5 %   MCV 94.3  78.0 - 100.0 fL   MCH 32.3  26.0 - 34.0 pg   MCHC 34.3  30.0 - 36.0 g/dL   RDW 40.9  81.1 - 91.4 %   Platelets 170  150 - 400 K/uL   Neutrophils Relative % 61  43 - 77 %   Neutro Abs 5.0  1.7 - 7.7 K/uL   Lymphocytes Relative 28  12 - 46 %   Lymphs Abs 2.3  0.7 - 4.0 K/uL   Monocytes Relative 7  3 - 12 %   Monocytes Absolute 0.5  0.1 - 1.0 K/uL   Eosinophils Relative 4  0 - 5 %   Eosinophils Absolute 0.3  0.0 - 0.7 K/uL   Basophils Relative 1  0 - 1 %   Basophils Absolute 0.0  0.0 - 0.1 K/uL  BASIC METABOLIC PANEL     Status: Abnormal   Collection Time    12/20/12 12:29 PM      Result Value Range   Sodium 141  135 - 145 mEq/L   Potassium 5.3 (*) 3.5 - 5.1 mEq/L   Chloride 105  96 - 112 mEq/L   CO2 32  19 - 32 mEq/L   Glucose, Bld 102 (*) 70 - 99 mg/dL   BUN 16  6 - 23 mg/dL   Creatinine, Ser 7.82  0.50 - 1.35 mg/dL   Calcium 9.1  8.4 - 95.6 mg/dL   GFR calc non Af Amer 47 (*) >90 mL/min   GFR calc Af Amer 54 (*) >90 mL/min   Comment:            The eGFR has been calculated  using the CKD EPI equation.     This calculation has not been     validated in all clinical     situations.     eGFR's persistently     <90 mL/min signify     possible Chronic Kidney Disease.  TROPONIN I     Status: None   Collection Time    12/20/12 12:29 PM      Result Value Range   Troponin I <0.30  <0.30 ng/mL   Comment:            Due to the release kinetics of cTnI,     a negative result within the first hours     of the onset of symptoms does not rule out     myocardial infarction with certainty.     If myocardial infarction is still suspected,      repeat the test at appropriate intervals.  URINALYSIS, ROUTINE W REFLEX MICROSCOPIC     Status: None   Collection Time    12/20/12  3:07 PM      Result Value Range   Color, Urine YELLOW  YELLOW   APPearance CLEAR  CLEAR   Specific Gravity, Urine 1.023  1.005 - 1.030   pH 5.5  5.0 - 8.0   Glucose, UA NEGATIVE  NEGATIVE mg/dL   Hgb urine dipstick NEGATIVE  NEGATIVE   Bilirubin Urine NEGATIVE  NEGATIVE   Ketones, ur NEGATIVE  NEGATIVE mg/dL   Protein, ur NEGATIVE  NEGATIVE mg/dL   Urobilinogen, UA 0.2  0.0 - 1.0 mg/dL   Nitrite NEGATIVE  NEGATIVE   Leukocytes, UA NEGATIVE  NEGATIVE   Comment: MICROSCOPIC NOT DONE ON URINES WITH NEGATIVE PROTEIN, BLOOD, LEUKOCYTES, NITRITE, OR GLUCOSE <1000 mg/dL.  HEMOGLOBIN A1C     Status: None   Collection Time    12/21/12  6:45 AM      Result Value Range   Hemoglobin A1C 5.6  <5.7 %   Comment: (NOTE)                                                                               According to the ADA Clinical Practice Recommendations for 2011, when     HbA1c is used as a screening test:      >=6.5%   Diagnostic of Diabetes Mellitus               (if abnormal result is confirmed)     5.7-6.4%   Increased risk of developing Diabetes Mellitus     References:Diagnosis and Classification of Diabetes Mellitus,Diabetes     Care,2011,34(Suppl 1):S62-S69 and Standards of Medical Care in             Diabetes - 2011,Diabetes Care,2011,34 (Suppl 1):S11-S61.   Mean Plasma Glucose 114  <117 mg/dL  BASIC METABOLIC PANEL     Status: Abnormal   Collection Time    12/21/12  6:45 AM      Result Value Range   Sodium 141  135 - 145 mEq/L   Potassium 4.3  3.5 - 5.1 mEq/L   Chloride 106  96 - 112 mEq/L   CO2 26  19 - 32 mEq/L   Glucose, Bld  102 (*) 70 - 99 mg/dL   BUN 17  6 - 23 mg/dL   Creatinine, Ser 1.47  0.50 - 1.35 mg/dL   Calcium 9.0  8.4 - 82.9 mg/dL   GFR calc non Af Amer 54 (*) >90 mL/min   GFR calc Af Amer 62 (*) >90 mL/min   Comment:            The  eGFR has been calculated     using the CKD EPI equation.     This calculation has not been     validated in all clinical     situations.     eGFR's persistently     <90 mL/min signify     possible Chronic Kidney Disease.  LIPID PANEL     Status: Abnormal   Collection Time    12/21/12  6:45 AM      Result Value Range   Cholesterol 156  0 - 200 mg/dL   Triglycerides 562 (*) <150 mg/dL   HDL 35 (*) >13 mg/dL   Total CHOL/HDL Ratio 4.5     VLDL 47 (*) 0 - 40 mg/dL   LDL Cholesterol 74  0 - 99 mg/dL   Comment:            Total Cholesterol/HDL:CHD Risk     Coronary Heart Disease Risk Table                         Men   Women      1/2 Average Risk   3.4   3.3      Average Risk       5.0   4.4      2 X Average Risk   9.6   7.1      3 X Average Risk  23.4   11.0                Use the calculated Patient Ratio     above and the CHD Risk Table     to determine the patient's CHD Risk.                ATP III CLASSIFICATION (LDL):      <100     mg/dL   Optimal      086-578  mg/dL   Near or Above                        Optimal      130-159  mg/dL   Borderline      469-629  mg/dL   High      >528     mg/dL   Very High    Ct Head Wo Contrast  12/20/2012   *RADIOLOGY REPORT*  Clinical Data: Status post fall 2 days ago.  CT HEAD WITHOUT CONTRAST  Technique:  Contiguous axial images were obtained from the base of the skull through the vertex without contrast.  Comparison: Head CT scan 04/17/2012 and brain MRI 04/18/2012.  Findings: Atrophy and extensive chronic microvascular ischemic change are again seen.  No evidence of acute abnormality including infarction, hemorrhage, mass lesion, mass effect, midline shift or abnormal extra-axial fluid collection is identified.  Mucous retention cyst or polyp in the right maxillary sinus is noted.  The calvarium is intact.  IMPRESSION: No acute finding.  Atrophy and extensive chronic microvascular ischemic change.   Original Report Authenticated By: Holley Dexter, M.D.   Mr  Brain Wo Contrast  12/21/2012   *RADIOLOGY REPORT*  Clinical Data:  History of stroke and seizures.  Fall 2 days ago. Overall weakness.  Hypertension.  MRI BRAIN WITHOUT CONTRAST MRA HEAD WITHOUT CONTRAST  Technique: Multiplanar, multiecho pulse sequences of the brain and surrounding structures were obtained according to standard protocol without intravenous contrast.  Angiographic images of the head were obtained using MRA technique without contrast.  Comparison: 12/20/2012 CT.  06/19/2011 brain MR.  MRI HEAD  Findings:  Artifact arises from the small metallic structure adjacent to the right zygoma.  The patient had no difficulty during MR scanning.  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  No intracranial hemorrhage.  No intracranial mass lesion detected on this unenhanced exam.  Moderate small vessel disease type changes.  Global atrophy without hydrocephalus.  Cervical medullary junction, pituitary region, pineal region and orbital structures unremarkable.  Polypoid opacification inferior aspect right maxillary sinus may represent a retention cyst and less likely polyp.  No evidence of mesial temporal sclerosis.  IMPRESSION: Small acute non hemorrhagic infarct left thalamus.  Please see above.  MRA HEAD  Findings: Ectatic internal carotid arteries.  Mild narrowing supraclinoid aspect of the internal carotid artery bilaterally more notable on the left.  Moderate narrowing distal A1 segment right anterior cerebral artery.  Mild narrowing irregularity M1 segment right middle cerebral artery.  Middle cerebral artery and A2 segment branch vessel narrowing irregularity bilaterally.  Right vertebral artery is dominant. Narrowed left vertebral artery after the takeoff of the PICA.  Poor delineation in the right PICA.  Nonvisualization left AICA.  Narrowed right AICA.  Moderate narrowing P1 segment right posterior cerebral artery with fetal type contribution also noted.  Moderate tandem  stenosis of the left posterior cerebral artery branches.  No aneurysm noted.  IMPRESSION: Intracranial atherosclerotic type changes as discussed above.  This has been made a PRA call report utilizing dashboard call feature.   Original Report Authenticated By: Lacy Duverney, M.D.   Dg Chest Portable 1 View  12/20/2012   *RADIOLOGY REPORT*  Clinical Data: Weakness, bradycardia  PORTABLE CHEST - 1 VIEW  Comparison: 10/26/2011  Findings: Evidence of CABG.  Mild prominence the cardiac silhouette may be in part projectional, without evidence for edema.  No pleural effusion.  Partial omission of the left costophrenic angle from the field of view.  No pneumothorax.  Nodular opacity over the right anterior first rib is noted, compatible with superimposition of normal structures as seen on similar prior exam 06/19/2011.  No focal pulmonary opacity.  IMPRESSION: No acute cardiopulmonary process.   Original Report Authenticated By: Christiana Pellant, M.D.   Mr Mra Head/brain Wo Cm  12/21/2012   *RADIOLOGY REPORT*  Clinical Data:  History of stroke and seizures.  Fall 2 days ago. Overall weakness.  Hypertension.  MRI BRAIN WITHOUT CONTRAST MRA HEAD WITHOUT CONTRAST  Technique: Multiplanar, multiecho pulse sequences of the brain and surrounding structures were obtained according to standard protocol without intravenous contrast.  Angiographic images of the head were obtained using MRA technique without contrast.  Comparison: 12/20/2012 CT.  06/19/2011 brain MR.  MRI HEAD  Findings:  Artifact arises from the small metallic structure adjacent to the right zygoma.  The patient had no difficulty during MR scanning.  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  No intracranial hemorrhage.  No intracranial mass lesion detected on this unenhanced exam.  Moderate small vessel disease type changes.  Global atrophy without hydrocephalus.  Cervical medullary junction, pituitary region, pineal region  and orbital structures  unremarkable.  Polypoid opacification inferior aspect right maxillary sinus may represent a retention cyst and less likely polyp.  No evidence of mesial temporal sclerosis.  IMPRESSION: Small acute non hemorrhagic infarct left thalamus.  Please see above.  MRA HEAD  Findings: Ectatic internal carotid arteries.  Mild narrowing supraclinoid aspect of the internal carotid artery bilaterally more notable on the left.  Moderate narrowing distal A1 segment right anterior cerebral artery.  Mild narrowing irregularity M1 segment right middle cerebral artery.  Middle cerebral artery and A2 segment branch vessel narrowing irregularity bilaterally.  Right vertebral artery is dominant. Narrowed left vertebral artery after the takeoff of the PICA.  Poor delineation in the right PICA.  Nonvisualization left AICA.  Narrowed right AICA.  Moderate narrowing P1 segment right posterior cerebral artery with fetal type contribution also noted.  Moderate tandem stenosis of the left posterior cerebral artery branches.  No aneurysm noted.  IMPRESSION: Intracranial atherosclerotic type changes as discussed above.  This has been made a PRA call report utilizing dashboard call feature.   Original Report Authenticated By: Lacy Duverney, M.D.   A1c -5.6 LDL-74  Assessment/Plan: 79 YO male with left thalamic infarct, which likely occurred a few days prior to hospitalization.   Recommend: 1) Change patient from ASA to Plavix 75 mg daily 2) Continue with risk factor control ( BP, blood glucose and LDL)  3) PT  Assessment and plan discussed with with attending physician and they are in agreement.    Felicie Morn PA-C [facility] [phone]   I personally participate in this patient's evaluation and management, including formulating the above clinical impression and management recommendations.  Venetia Maxon M.D. [facility] [phone]  12/21/2012, 4:34 PM

## 2012-12-21 NOTE — Progress Notes (Signed)
EEG Completed; Results Pending  

## 2012-12-22 LAB — BASIC METABOLIC PANEL
BUN: 15 mg/dL (ref 6–23)
Calcium: 8.7 mg/dL (ref 8.4–10.5)
GFR calc Af Amer: 67 mL/min — ABNORMAL LOW (ref >90)
GFR calc non Af Amer: 58 mL/min — ABNORMAL LOW (ref >90)
Potassium: 4.2 mEq/L (ref 3.5–5.1)

## 2012-12-22 LAB — CBC
HCT: 38.2 % — ABNORMAL LOW (ref 39.0–52.0)
MCH: 31.7 pg (ref 26.0–34.0)
MCHC: 34.8 g/dL (ref 30.0–36.0)
RDW: 13.2 % (ref 11.5–15.5)

## 2012-12-22 MED ORDER — ASPIRIN 325 MG PO TABS: 325.0000 mg | ORAL_TABLET | Freq: Every day | ORAL | Status: DC

## 2012-12-22 MED ORDER — SERTRALINE HCL 25 MG PO TABS: 25.0000 mg | ORAL_TABLET | Freq: Every day | ORAL | Status: DC

## 2012-12-22 MED ORDER — SIMVASTATIN 20 MG PO TABS
20.0000 mg | ORAL_TABLET | Freq: Every day | ORAL | Status: DC
  Filled 2012-12-22: qty 1

## 2012-12-22 NOTE — Evaluation (Signed)
I agree with the following treatment note after reviewing documentation.   Johnston, Synia Douglass Brynn   OTR/L Pager: [phone] Office: [phone] .   

## 2012-12-22 NOTE — Progress Notes (Signed)
Patient accidentally pulled out IV. Refused to restart new IV. Patient said he want to wait till the doctor comes for the rounds.

## 2012-12-22 NOTE — Care Management Note (Signed)
    Page 1 of 1   12/22/2012     2:25:36 PM   CARE MANAGEMENT NOTE 12/22/2012  Patient:  Larry Bradford, Larry Bradford   Account Number:  [id number]  Date Initiated:  12/21/2012  Documentation initiated by:  Jiles Crocker  Subjective/Objective Assessment:   ADMITTED WITH slurred speech and difficulty walking     Action/Plan:   PCP: Georgianne Fick, MD  LIVES AT HOME WITH SPOUSE/ CM FOLLOWING FOR DCP; AWAITING FOR PT/OT EVALS FOR DISPOSITION   Anticipated DC Date:  12/26/2012   Anticipated DC Plan:  HOME W HOME HEALTH SERVICES      DC Planning Services  CM consult      Choice offered to / List presented to:  C-3 Spouse           Status of service:  Completed, signed off Medicare Important Message given?  NA - LOS <3 / Initial given by admissions (If response is "NO", the following Medicare IM given date fields will be blank) Date Medicare IM given:   Date Additional Medicare IM given:    Discharge Disposition:  HOME/SELF CARE  Per UR Regulation:  Reviewed for med. necessity/level of care/duration of stay  If discussed at Long Length of Stay Meetings, dates discussed:    Comments:  12/22/12 1410 Elmer Bales RN, MSN, CM- Spoke with patient and wife to discuss home health PT/OT.  Pt and wife decline home health at this time.  CM encouraged patient to speak with his PCP if needs should arise after discharge. Dr Benjamine Mola aware that patient declined.   12/21/2012- B CHANDLER RN,BSN,MHA

## 2012-12-22 NOTE — Evaluation (Signed)
Occupational Therapy Evaluation Patient Details Name: Larry Bradford MRN: [id number] DOB: 1917/04/07 Today's Date: 12/22/2012 Time: 4098-1191 OT Time Calculation (min): 27 min  OT Assessment / Plan / Recommendation History of present illness 79 y.o. male with difficulty walking for several days. Slurred speech.2 days ago, fell and broke a pot. Pt amnestic to event. Wife reports confusion "out of it" MRI shows Lt thalamic infarct.    Clinical Impression   PT admitted with difficulty walking and slurred speech. Pt is very HOH. Pt currently with functional limitiations due to the deficits listed below (see OT problem list). Main focus for OT will be balance and vision. Pt will benefit from skilled OT to increase their independence and safety with adls and balance to allow discharge home with HHOT.     OT Assessment  Patient needs continued OT Services    Follow Up Recommendations  Home health OT       Equipment Recommendations  Tub/shower seat (with back)       Frequency  Min 2X/week    Precautions / Restrictions Precautions Precautions: Fall Precaution Comments: Pt able to verbalize that he will use a cane at home to get around. Restrictions Weight Bearing Restrictions: No   Pertinent Vitals/Pain Pt reported no pain during session.    ADL  Eating/Feeding: Modified independent Where Assessed - Eating/Feeding: Chair Grooming: Wash/dry hands;Wash/dry face;Brushing hair;Performed;Min guard Where Assessed - Grooming: Supported standing Upper Body Bathing: Min guard Where Assessed - Upper Body Bathing: Unsupported sitting Lower Body Bathing: Min guard Where Assessed - Lower Body Bathing: Supported sitting Upper Body Dressing: Set up Where Assessed - Upper Body Dressing: Unsupported sitting Lower Body Dressing: Min guard Where Assessed - Lower Body Dressing: Supported sitting Toilet Transfer: Min Pension scheme manager Method: Sit to Barista: Comfort  height toilet Toileting - Architect and Hygiene: Supervision/safety Where Assessed - Engineer, mining and Hygiene: Sit to stand from 3-in-1 or toilet Tub/Shower Transfer: Min guard Tub/Shower Transfer Method: Science writer: Shower seat with back Equipment Used: Gait belt Transfers/Ambulation Related to ADLs: Pt min guard for safety during transfers and ambulation. Pt promises he will use a cane at home to help with balance deficits ADL Comments: Pt able to go in bathroom and problem solve to find grooming objects that OTS had spread around the bathroom. Pt with self corrected LOB x2 while performing grooming tasks at sink. Pt given stroke handout and reviewed signs and symptoms of stroke, and importance of time in getting to the hospital in that 3 hour time window.    OT Diagnosis: Disturbance of vision  OT Problem List: Impaired balance (sitting and/or standing);Impaired vision/perception;Decreased safety awareness;Decreased knowledge of use of DME or AE OT Treatment Interventions: Therapeutic exercise;DME and/or AE instruction;Therapeutic activities;Visual/perceptual remediation/compensation;Balance training   OT Goals(Current goals can be found in the care plan section) Acute Rehab OT Goals Patient Stated Goal: To go home OT Goal Formulation: With patient/family Time For Goal Achievement: 01/05/13 Potential to Achieve Goals: Good ADL Goals Pt Will Perform Tub/Shower Transfer: with modified independence;shower seat;ambulating Additional ADL Goal #1: Pt will be able to state the signs and symptoms of stroke, and how quickly he needs to get to the hospital for TPA. Additional ADL Goal #2: Pt will maintain balance at sink for ADL for 10 min with no LOB  Visit Information  Last OT Received On: 12/22/12 Assistance Needed: +1 History of Present Illness: 79 y.o. male with difficulty walking for several days. Slurred  speech.2 days ago, fell  and broke a pot. Pt amnestic to event. Wife reports confusion "out of it" MRI shows Lt thalamic infarct.        Prior Functioning     Home Living Family/patient expects to be discharged to:: Private residence Living Arrangements: Spouse/significant other Available Help at Discharge: Family Type of Home: House Home Access: Stairs to enter Secretary/administrator of Steps: 4 Entrance Stairs-Rails: Right Home Layout: One level Home Equipment: Environmental consultant - 2 wheels;Cane - single point Prior Function Level of Independence: Independent Comments: reports he does not usually use an assistive device. Denies recent h/o falls.  Communication Communication: HOH;Other (comment) (mumbling speech) Dominant Hand: Right         Vision/Perception Vision - History Baseline Vision: Wears glasses only for reading Visual History: Cataracts Patient Visual Report: No change from baseline Vision - Assessment Eye Alignment: Impaired (comment) Vision Assessment: Vision tested Ocular Range of Motion: Within Functional Limits Alignment/Gaze Preference: Within Defined Limits Tracking/Visual Pursuits: Decreased smoothness of eye movement to RIGHT superior field;Decreased smoothness of eye movement to RIGHT inferior field Saccades: Within functional limits Visual Fields: No apparent deficits Additional Comments: patient with work up in 06/2011 for ptosis without a cause listed in medical chart at that time   Cognition  Cognition Arousal/Alertness: Awake/alert Behavior During Therapy: [facility] for tasks assessed/performed Overall Cognitive Status: Within Functional Limits for tasks assessed    Extremity/Trunk Assessment Upper Extremity Assessment Upper Extremity Assessment: Overall WFL for tasks assessed Lower Extremity Assessment Lower Extremity Assessment: Defer to PT evaluation Cervical / Trunk Assessment Cervical / Trunk Assessment: Kyphotic     Mobility Bed Mobility Bed Mobility: Supine to  Sit;Sitting - Scoot to Edge of Bed Supine to Sit: 5: Supervision Sitting - Scoot to Edge of Bed: 6: Modified independent (Device/Increase time) Details for Bed Mobility Assistance: pt uses his legs to generate momentum to raise his torso to sitting eOB Transfers Transfers: Sit to Stand;Stand to Sit Sit to Stand: 4: Min guard Stand to Sit: 4: Min guard Details for Transfer Assistance: min guard for safety and balance        Balance Balance Balance Assessed: Yes Static Sitting Balance Static Sitting - Balance Support: No upper extremity supported;Feet supported Static Sitting - Level of Assistance: 7: Independent Static Standing Balance Static Standing - Balance Support: No upper extremity supported Static Standing - Level of Assistance: 5: Stand by assistance   End of Session OT - End of Session Equipment Utilized During Treatment: Gait belt Activity Tolerance: Patient tolerated treatment well Patient left: in chair;with call bell/phone within reach;with family/visitor present;Other (comment) (with MD in room) Nurse Communication: Mobility status;Precautions  GO     Sherryl Manges 12/22/2012, 12:08 PM

## 2012-12-22 NOTE — Progress Notes (Signed)
Physical Therapy Evaluation (late entry for 12/21/12--Eval note not pulled into notes section)   12/21/12 1628  PT Visit Information  Last PT Received On 12/21/12  Assistance Needed +1  History of Present Illness 79 y.o. male with difficulty walking for several days. Slurred speech.2 days ago, fell and broke a pot. Pt amnestic to event. Wife reports confusion "out of it" MRI shows Lt thalamic infarct.   Precautions  Precautions Fall  Precaution Comments pt able to verbalize that he should not get up alone  Home Living  Family/patient expects to be discharged to: Private residence  Living Arrangements Spouse/significant other  Available Help at Discharge Family (spouse)  Type of Home House  Home Access Stairs to enter  Entrance Stairs-Number of Steps 4  Entrance Stairs-Rails Right  Home Layout One level  Home Equipment Walker - 2 wheels;Cane - single point  Prior Function  Level of Independence Independent  Comments reports he does not usually use an assistive device. Denies recent h/o falls.   Communication  Communication HOH  Cognition  Arousal/Alertness Awake/alert  Behavior During Therapy WFL for tasks assessed/performed  Overall Cognitive Status Within Functional Limits for tasks assessed  Upper Extremity Assessment  Upper Extremity Assessment Defer to OT evaluation  Lower Extremity Assessment  Lower Extremity Assessment RLE deficits/detail;LLE deficits/detail  RLE Deficits / Details AROM wfl; knee extension, dorsiflexion 5/5  LLE Deficits / Details AROM wfl; knee extension 5/5, dorsiflexion 4+/5  Cervical / Trunk Assessment  Cervical / Trunk Assessment Kyphotic  Bed Mobility  Bed Mobility Supine to Sit;Sitting - Scoot to Edge of Bed  Supine to Sit 4: Min guard  Sitting - Scoot to [place] of Bed 6: Modified independent (Device/Increase time)  Details for Bed Mobility Assistance pt uses his legs to generate momentum to raise his torso to sitting eOB  Transfers  Transfers  Sit to Stand;Stand to Sit  Sit to Stand 4: Min guard  Stand to Sit 4: Min guard  Details for Transfer Assistance x3; pt with wider BOS   Ambulation/Gait  Ambulation/Gait Assistance 4: Min guard  Ambulation Distance (Feet) 125 Feet  Assistive device None  Ambulation/Gait Assistance Details Pt with staggering LOB x1 to his Rt. Required min assist to recover.   Gait Pattern Decreased stride length;Step-through pattern;Wide base of support  Modified Rankin (Stroke Patients Only)  Pre-Morbid Rankin Score 2  Modified Rankin 4  Balance  Balance Assessed Yes  Static Sitting Balance  Static Sitting - Balance Support No upper extremity supported;Feet supported  Static Sitting - Level of Assistance 7: Independent  Dynamic Sitting Balance  Dynamic Sitting - Balance Support No upper extremity supported;Feet unsupported  Dynamic Sitting - Level of Assistance 5: Stand by assistance  Static Standing Balance  Static Standing - Balance Support No upper extremity supported  Static Standing - Level of Assistance 5: Stand by assistance  Rhomberg - Eyes Opened 15  Tandem Stance - Right Leg 15  Rhomberg - Eyes Closed 10  PT - End of Session  Equipment Utilized During Treatment Gait belt  Activity Tolerance Patient tolerated treatment well  Patient left in chair;with call bell/phone within reach;with chair alarm set  Nurse Communication Mobility status  PT Assessment  PT Recommendation/Assessment Patient needs continued PT services  PT Problem List Decreased strength;Decreased balance;Decreased mobility;Decreased safety awareness;Decreased knowledge of use of DME  PT Therapy Diagnosis  Difficulty walking  PT Plan  PT Frequency Min 4X/week  PT Treatment/Interventions DME instruction;Gait training;Stair training;Functional mobility training;Therapeutic activities;Neuromuscular re-education;Patient/family education  PT Recommendation  Recommendations for Other Services OT consult  Follow Up  Recommendations Home health PT;Supervision/Assistance - 24 hour  PT equipment None recommended by PT  Individuals Consulted  Consulted and Agree with Results and Recommendations Patient  Acute Rehab PT Goals  Patient Stated Goal walk better  PT Goal Formulation With patient  Time For Goal Achievement 12/28/12  Potential to Achieve Goals Good  PT Time Calculation  PT Start Time 1603  PT Stop Time 1624  PT Time Calculation (min) 21 min  PT General Charges  $$ ACUTE PT VISIT 1 Procedure  PT Evaluation  $Initial PT Evaluation Tier I 1 Procedure  PT Treatments  $Gait Training 8-22 mins

## 2012-12-22 NOTE — Progress Notes (Signed)
Stroke Team Progress Note  HISTORY Larry Bradford is an 79 y.o. male who was brought to hospital due to gait difficulty confusion and slurred speech that has been persistent for multiple days. MRI of brain was obtained while in hospital that showed a small left thalamic infarct and MRA showed multiple bilateral areas of intracranial atherosclerosis. Carotid dopplers show right 40-59% ICA stenosis and <39% stenosis of left ICA. Echo is pending. Patient has history of seizure bbut has had no seizures while hospitalized. Currently patient is alert and oriented to time, date, year and month--when asked about recent event of stroke he cannot give me a great history but feels this may have going on for a week. When asked if he takes ASA at home he denies this but it is on his med rec.   Patient was not a TPA candidate secondary to length of time of symptoms. He was admitted to the neuro floor for further evaluation and treatment.  SUBJECTIVE He feels better. No new symptoms. Still needs assistance with balance.  OBJECTIVE Most recent Vital Signs: Filed Vitals:   12/21/12 1434 12/21/12 1841 12/22/12 0140 12/22/12 0534  BP: 129/92 133/50 136/95 129/50  Pulse: 53 60 60 65  Temp: 97.8 F (36.6 C) 97.4 F (36.3 C) 97.5 F (36.4 C) 97.6 F (36.4 C)  TempSrc: Oral Oral Oral Oral  Resp: 20 20 20 20   Height:      Weight:      SpO2: 97% 96% 98% 94%   CBG (last 3)  No results found for this basename: GLUCAP,  in the last 72 hours  IV Fluid Intake:     MEDICATIONS  . aspirin  300 mg Rectal Daily   Or  . aspirin  325 mg Oral Daily  . enoxaparin (LOVENOX) injection  40 mg Subcutaneous Q24H  . levETIRAcetam  500 mg Oral Q12H  . metoprolol succinate  25 mg Oral Daily  . sertraline  25 mg Oral Daily   PRN:  acetaminophen, acetaminophen  Diet:  Cardiac thin liquids Activity:  Out of bed with assist DVT Prophylaxis:  lovenox  CLINICALLY SIGNIFICANT STUDIES Basic Metabolic Panel:  Recent  Labs Lab 12/21/12 0645 12/22/12 0610  NA 141 138  K 4.3 4.2  CL 106 105  CO2 26 27  GLUCOSE 102* 113*  BUN 17 15  CREATININE 1.13 1.06  CALCIUM 9.0 8.7   Liver Function Tests: No results found for this basename: AST, ALT, ALKPHOS, BILITOT, PROT, ALBUMIN,  in the last 168 hours CBC:  Recent Labs Lab 12/20/12 1229 12/22/12 0610  WBC 8.2 6.9  NEUTROABS 5.0  --   HGB 13.7 13.3  HCT 40.0 38.2*  MCV 94.3 91.0  PLT 170 154   Coagulation: No results found for this basename: LABPROT, INR,  in the last 168 hours Cardiac Enzymes:  Recent Labs Lab 12/20/12 1229  TROPONINI <0.30   Urinalysis:  Recent Labs Lab 12/20/12 1507  COLORURINE YELLOW  LABSPEC 1.023  PHURINE 5.5  GLUCOSEU NEGATIVE  HGBUR NEGATIVE  BILIRUBINUR NEGATIVE  KETONESUR NEGATIVE  PROTEINUR NEGATIVE  UROBILINOGEN 0.2  NITRITE NEGATIVE  LEUKOCYTESUR NEGATIVE   Lipid Panel    Component Value Date/Time   CHOL 156 12/21/2012 0645   TRIG 237* 12/21/2012 0645   HDL 35* 12/21/2012 0645   CHOLHDL 4.5 12/21/2012 0645   VLDL 47* 12/21/2012 0645   LDLCALC 74 12/21/2012 0645   HgbA1C  Lab Results  Component Value Date   HGBA1C 5.6 12/21/2012  Urine Drug Screen:     Component Value Date/Time   LABOPIA NONE DETECTED 08/21/2008 0214   COCAINSCRNUR NONE DETECTED 08/21/2008 0214   LABBENZ NONE DETECTED 08/21/2008 0214   AMPHETMU NONE DETECTED 08/21/2008 0214   THCU NONE DETECTED 08/21/2008 0214   LABBARB  Value: NONE DETECTED        DRUG SCREEN FOR MEDICAL PURPOSES ONLY.  IF CONFIRMATION IS NEEDED FOR ANY PURPOSE, NOTIFY LAB WITHIN 5 DAYS.        LOWEST DETECTABLE LIMITS FOR URINE DRUG SCREEN Drug Class       Cutoff (ng/mL) Amphetamine      1000 Barbiturate      200 Benzodiazepine   200 Tricyclics       300 Opiates          300 Cocaine          300 THC              50 08/21/2008 0214    Alcohol Level: No results found for this basename: ETH,  in the last 168 hours  Ct Head Wo Contrast 12/20/2012  No acute finding.   Atrophy and extensive chronic microvascular ischemic change.    Mr Brain Wo Contrast 12/21/2012    Small acute non hemorrhagic infarct left thalamus.   Dg Chest Portable 1 View 12/20/2012  No acute cardiopulmonary process.  . Mr Maxine Glenn Head/brain Wo Cm 12/21/2012   Intracranial atherosclerotic type changes as discussed above. .   2D Echocardiogram  EF 60%, wall motion normal, left atrium moderately dilated, severly calcified aortic valve but moderate stenosis.  Carotid Doppler  Right = 40-59% ICA stenosis. Based on moderate calcific plaque and ICA/CCA ratio stenosis appears to be more severe. Left = <39% ICA stenosis. Vertebral artery flow is antegrade.   EEG This is a normal EEG recording during wakefulness and during drowsiness  EKG  normal sinus rhythm.   Therapy Recommendations HHPT, HH SLP, HHOT  Physical Exam   Neurologic Examination:  Mental Status:  Alert, oriented, thought content appropriate. Speech fluent without evidence of aphasia. Able to follow 3 step commands without difficulty.  Cranial Nerves:  WJ:XBJYNW fields grossly normal, pupils equal, round III,IV, VI: ptosis not present, extra-ocular motions intact bilaterally  V,VII: smile symmetric, slight right NL fold decrease. facial light touch sensation normal bilaterally  VIII: hearing decreased bilaterally  IX,X: gag reflex present  XI: bilateral shoulder shrug  XII: midline tongue extension  Motor: UE/LE equal throughout Tone and bulk:normal tone throughout; no atrophy noted  Sensory: Pinprick and light touch intact throughout, bilaterally and denies any decreased sensation.  Plantars:  Right: downgoing Left: downgoing  Cerebellar:  normal finger-to-nose, normal heel-to-shin test  CV: pulses palpable throughout    ASSESSMENT Larry Bradford is a 79 y.o. male presenting with gait difficulty, slurred speech, left sided facial droop. Episode of acute delirium, question of seizure (EEG non-epileptic). Imaging  confirms a left thalamic infarct. Infarct felt to be thrombotic secondary to small vessel disease.  On aspirin 81 mg orally every day prior to admission. Now on aspirin 325 mg orally every day for secondary stroke prevention. Patient with resultant ataxia, left sided droop, slurred speech. Work up underway.   Seizures  Depression   Hypertension  Coronary artery disease, s/p CABG  Hyperlipidemia, LDL  Trigeminal neuralgia history  HGB A1C 5.6  Hospital day # 2  TREATMENT/PLAN  Continue aspirin 325 mg orally every day for secondary stroke prevention.  Risk factor modification  Home Health Therapies  Patient can follow up with Dr. Pearlean Brownie in office in 2 months.  Gwendolyn Lima. Manson Passey, [facility], MBA, MHA [facility] Pager: [phone] 12/22/2012 2:25 PM  I have personally obtained a history, examined the patient, evaluated imaging results, and formulated the assessment and plan of care. I agree with the above. Delia Heady, MD

## 2012-12-22 NOTE — Evaluation (Signed)
Speech Language Pathology Evaluation Patient Details Name: Larry Bradford MRN: [id number] DOB: 1916/06/09 Today's Date: 12/22/2012 Time: 3086-5784 SLP Time Calculation (min): 15 min  Problem List:  Patient Active Problem List   Diagnosis Date Noted  . Acute ischemic stroke 12/20/2012  . Hyperkalemia 12/20/2012  . Seizures   . Cataract   . Hard of hearing   . Renal disorder   . Depression   . HTN (hypertension)   . BPH (benign prostatic hyperplasia)   . CAD (coronary artery disease)   . Hyperlipidemia   . Carotid stenosis   . Hypertension 06/18/2011  . Weakness 06/18/2011   Past Medical History:  Past Medical History  Diagnosis Date  . Seizures     Onset in Sept 2006  . Cataract   . Hard of hearing   . Renal disorder   . Depression   . HTN (hypertension)   . BPH (benign prostatic hyperplasia)   . CAD (coronary artery disease)     s/p CABG in 2002  . Hyperlipidemia   . OSA (obstructive sleep apnea)   . Aortic aneurysm     3 CM in 2007  . Carotid stenosis   . DDD (degenerative disc disease)   . Trigeminal neuralgia   . Lower GI bleed    Past Surgical History:  Past Surgical History  Procedure Laterality Date  . Coronary artery bypass graft  2002  . Temporal artery biopsy / ligation  2004   HPI:  Larry Bradford is an 79 y.o. male who was brought to hospital due to gait difficulty confusion and slurred speech that has been persistent for multiple days. MRI of brain was obtained while in hospital that showed a small left thalamic infarct and MRA showed multiple bilateral areas of intracranial atherosclerosis. Carotid dopplers show right 40-59% ICA stenosis and <39% stenosis of left ICA. Echo is pending. Patient has history of seizure bbut has had no seizures while hospitalized. Currently patient is alert and oriented to time, date, year and month--when asked about recent event of stroke he cannot give me a great history but feels this may have going on for a week.      Assessment / Plan / Recommendation Clinical Impression  Pt demonstrates a mild dysarthria and evidence of declining memory and processing for higher level reasoning and problem solving tasks. Pt reorts he has been bothered by these changes. Recommend Home Health SLP f/u to address articulation and cognition. No acute needs will sign off.     SLP Assessment  All further Speech Lanaguage Pathology  needs can be addressed in the next venue of care    Follow Up Recommendations  Home health SLP    Frequency and Duration        Pertinent Vitals/Pain NA   SLP Goals     SLP Evaluation Prior Functioning  Cognitive/Linguistic Baseline: Baseline deficits Baseline deficit details: memory, cognitive decline Type of Home: House  Lives With: Spouse Available Help at Discharge: Family Vocation: Retired   IT consultant  Overall Cognitive Status: Impaired/Different from baseline Arousal/Alertness: Awake/alert Orientation Level: Oriented X4 Attention: Focused;Sustained;Selective;Alternating;Divided Focused Attention: Appears intact Sustained Attention: Appears intact Selective Attention: Appears intact Alternating Attention: Appears intact Divided Attention: Appears intact Memory: Impaired Memory Impairment: Retrieval deficit;Decreased short term memory Decreased Short Term Memory: Verbal basic Awareness: Appears intact Problem Solving: Impaired Problem Solving Impairment: Functional complex;Verbal complex Executive Function: Reasoning Reasoning: Impaired Reasoning Impairment: Functional complex;Verbal complex Safety/Judgment: Appears intact    Comprehension  Auditory Comprehension  Overall Auditory Comprehension: Impaired Yes/No Questions: Within Functional Limits Commands: Impaired One Step Basic Commands: 75-100% accurate Multistep Basic Commands: 75-100% accurate Complex Commands: 50-74% accurate Conversation: Simple Interfering Components: Processing speed;Working  Civil Service fast streamer;Hearing    Expression Verbal Expression Overall Verbal Expression: Appears within functional limits for tasks assessed Written Expression Dominant Hand: Right Written Expression: Exceptions to [facility] Self Formulation Ability: Word   Oral / Motor Oral Motor/Sensory Function Overall Oral Motor/Sensory Function: Appears within functional limits for tasks assessed Motor Speech Overall Motor Speech: Impaired Respiration: Within functional limits Phonation: Normal Resonance: Within functional limits Articulation: Impaired Level of Impairment: Conversation Intelligibility: Intelligibility reduced Word: 75-100% accurate Phrase: 75-100% accurate Sentence: 75-100% accurate Conversation: 75-100% accurate   GO    [employer], MA CCC-SLP [phone]  Claudine Mouton 12/22/2012, 1:28 PM

## 2012-12-22 NOTE — Progress Notes (Signed)
Patient was also taking pravastatin 40 mg at home. Marlin Canary

## 2012-12-22 NOTE — Discharge Summary (Signed)
Physician Discharge Summary  Larry Bradford ZOX:[id number] DOB: 12-06-1916 DOA: 12/20/2012  PCP: Georgianne Fick, MD  Admit date: 12/20/2012 Discharge date: 12/22/2012  Time spent: 35 minutes  Recommendations for Outpatient Follow-up:  1. Home health- patient refused 2. Echo for severe AS as outpatient  Discharge Diagnoses:  Principal Problem:   Acute ischemic stroke Active Problems:   Hypertension   Seizures   Hard of hearing   Depression   CAD (coronary artery disease)   Hyperkalemia   Discharge Condition: improved  Diet recommendation: cardiac  Filed Weights   12/20/12 2003  Weight: 81.647 kg (180 lb)    History of present illness:  Larry Bradford is a 79 y.o. male with difficulty walking for several days. Slurred speech. Patient noticed difficulty walking a week ago. 2 days ago, fell and broke a pot. Pt amnestic to event. Wife reports confusion "out of it". Patient has seemed to have a left-sided facial droop as well. He states that he does feel weak but denies any focality. H/o seizure, but no witnessed seizure activity this week.   Hospital Course:  CVA: Continue aspirin 325 mg orally every day for secondary stroke prevention.  Pravastatin; Risk factor modification Home Health Therapies Patient can follow up with Dr. Pearlean Brownie in office in 2 months  Depression: Zoloft  Procedures:  Echo:  Left ventricle: The cavity size was normal. Wall thickness was increased in a pattern of mild LVH. Systolic function was normal. The estimated ejection fraction was in the range of 60% to 65%. Wall motion was normal; there were no regional wall motion abnormalities. Features are consistent with a pseudonormal left ventricular filling pattern, with concomitant abnormal relaxation and increased filling pressure (grade 2 diastolic dysfunction). - Aortic valve: Trileaflet; severely calcified leaflets. Visually at least moderate aortic stenosis. Mild by mean gradient. Would  suggest trying to get additional views of the aortic valve with additional gradient measurements in a limited study. Mean gradient: 16mm Hg (S). Peak gradient:49mm Hg (S). Valve area: 1.0 cm^2(VTI). Valve area: 1.36cm^2 (Vmax). - Mitral valve: Mild regurgitation. - Left atrium: The atrium was moderately dilated. - Right ventricle: The cavity size was mildly dilated. Systolic function was normal. - Right atrium: The atrium was mildly dilated. - Tricuspid valve: Peak RV-RA gradient:47mm Hg (S). - Pulmonary arteries: PA peak pressure: 48mm Hg (S). - Inferior vena cava: The vessel was normal in size; the respirophasic diameter changes were in the normal range (= 50%); findings are consistent with normal central venous pressure. - Pericardium, extracardiac: A trivial pericardial effusion was identified. Impressions:  - Normal LV size with mild LV hypertrophy. EF 60-65%. Moderate diastolic dysfunction. Mildly dilated RV with normal systolic function. Mild to moderate pulmonary hypertension. Severely calcified aortic valve. At least moderate stenosis visually but only mild by mean gradient. Would suggest additional views to obtain more gradient measurements on a limited echo versus TEE.    Consultations:  neuro  Discharge Exam: Filed Vitals:   12/21/12 1841 12/22/12 0140 12/22/12 0534 12/22/12 0957  BP: 133/50 136/95 129/50 133/51  Pulse: 60 60 65 64  Temp: 97.4 F (36.3 C) 97.5 F (36.4 C) 97.6 F (36.4 C) 97.6 F (36.4 C)  TempSrc: Oral Oral Oral Oral  Resp: 20 20 20 18   Height:      Weight:      SpO2: 96% 98% 94% 97%    General: A+Ox3, NAD Cardiovascular: rrr Respiratory: clear  Discharge Instructions  Discharge Orders   Future Orders Complete By Expires  Diet - low sodium heart healthy  As directed     Discharge instructions  As directed     Comments:      Continue pravastatin at home Home health PT/OT    Increase activity slowly  As directed          Medication List         aspirin 325 MG tablet  Take 1 tablet (325 mg total) by mouth daily.     levETIRAcetam 500 MG tablet  Commonly known as:  KEPPRA  Take 500 mg by mouth every 12 (twelve) hours.     metoprolol succinate 100 MG 24 hr tablet  Commonly known as:  TOPROL-XL  Take 100 mg by mouth daily. Take with or immediately following a meal.     sertraline 25 MG tablet  Commonly known as:  ZOLOFT  Take 1 tablet (25 mg total) by mouth daily.       No Known Allergies    The results of significant diagnostics from this hospitalization (including imaging, microbiology, ancillary and laboratory) are listed below for reference.    Significant Diagnostic Studies: Ct Head Wo Contrast  12/20/2012   *RADIOLOGY REPORT*  Clinical Data: Status post fall 2 days ago.  CT HEAD WITHOUT CONTRAST  Technique:  Contiguous axial images were obtained from the base of the skull through the vertex without contrast.  Comparison: Head CT scan 04/17/2012 and brain MRI 04/18/2012.  Findings: Atrophy and extensive chronic microvascular ischemic change are again seen.  No evidence of acute abnormality including infarction, hemorrhage, mass lesion, mass effect, midline shift or abnormal extra-axial fluid collection is identified.  Mucous retention cyst or polyp in the right maxillary sinus is noted.  The calvarium is intact.  IMPRESSION: No acute finding.  Atrophy and extensive chronic microvascular ischemic change.   Original Report Authenticated By: Holley Dexter, M.D.   Mr Brain Wo Contrast  12/21/2012   *RADIOLOGY REPORT*  Clinical Data:  History of stroke and seizures.  Fall 2 days ago. Overall weakness.  Hypertension.  MRI BRAIN WITHOUT CONTRAST MRA HEAD WITHOUT CONTRAST  Technique: Multiplanar, multiecho pulse sequences of the brain and surrounding structures were obtained according to standard protocol without intravenous contrast.  Angiographic images of the head were obtained using MRA technique  without contrast.  Comparison: 12/20/2012 CT.  06/19/2011 brain MR.  MRI HEAD  Findings:  Artifact arises from the small metallic structure adjacent to the right zygoma.  The patient had no difficulty during MR scanning.  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  No intracranial hemorrhage.  No intracranial mass lesion detected on this unenhanced exam.  Moderate small vessel disease type changes.  Global atrophy without hydrocephalus.  Cervical medullary junction, pituitary region, pineal region and orbital structures unremarkable.  Polypoid opacification inferior aspect right maxillary sinus may represent a retention cyst and less likely polyp.  No evidence of mesial temporal sclerosis.  IMPRESSION: Small acute non hemorrhagic infarct left thalamus.  Please see above.  MRA HEAD  Findings: Ectatic internal carotid arteries.  Mild narrowing supraclinoid aspect of the internal carotid artery bilaterally more notable on the left.  Moderate narrowing distal A1 segment right anterior cerebral artery.  Mild narrowing irregularity M1 segment right middle cerebral artery.  Middle cerebral artery and A2 segment branch vessel narrowing irregularity bilaterally.  Right vertebral artery is dominant. Narrowed left vertebral artery after the takeoff of the PICA.  Poor delineation in the right PICA.  Nonvisualization left AICA.  Narrowed right AICA.  Moderate narrowing P1 segment right posterior cerebral artery with fetal type contribution also noted.  Moderate tandem stenosis of the left posterior cerebral artery branches.  No aneurysm noted.  IMPRESSION: Intracranial atherosclerotic type changes as discussed above.  This has been made a PRA call report utilizing dashboard call feature.   Original Report Authenticated By: Lacy Duverney, M.D.   Dg Chest Portable 1 View  12/20/2012   *RADIOLOGY REPORT*  Clinical Data: Weakness, bradycardia  PORTABLE CHEST - 1 VIEW  Comparison: 10/26/2011  Findings: Evidence of CABG.   Mild prominence the cardiac silhouette may be in part projectional, without evidence for edema.  No pleural effusion.  Partial omission of the left costophrenic angle from the field of view.  No pneumothorax.  Nodular opacity over the right anterior first rib is noted, compatible with superimposition of normal structures as seen on similar prior exam 06/19/2011.  No focal pulmonary opacity.  IMPRESSION: No acute cardiopulmonary process.   Original Report Authenticated By: Christiana Pellant, M.D.   Mr Mra Head/brain Wo Cm  12/21/2012   *RADIOLOGY REPORT*  Clinical Data:  History of stroke and seizures.  Fall 2 days ago. Overall weakness.  Hypertension.  MRI BRAIN WITHOUT CONTRAST MRA HEAD WITHOUT CONTRAST  Technique: Multiplanar, multiecho pulse sequences of the brain and surrounding structures were obtained according to standard protocol without intravenous contrast.  Angiographic images of the head were obtained using MRA technique without contrast.  Comparison: 12/20/2012 CT.  06/19/2011 brain MR.  MRI HEAD  Findings:  Artifact arises from the small metallic structure adjacent to the right zygoma.  The patient had no difficulty during MR scanning.  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  No intracranial hemorrhage.  No intracranial mass lesion detected on this unenhanced exam.  Moderate small vessel disease type changes.  Global atrophy without hydrocephalus.  Cervical medullary junction, pituitary region, pineal region and orbital structures unremarkable.  Polypoid opacification inferior aspect right maxillary sinus may represent a retention cyst and less likely polyp.  No evidence of mesial temporal sclerosis.  IMPRESSION: Small acute non hemorrhagic infarct left thalamus.  Please see above.  MRA HEAD  Findings: Ectatic internal carotid arteries.  Mild narrowing supraclinoid aspect of the internal carotid artery bilaterally more notable on the left.  Moderate narrowing distal A1 segment right  anterior cerebral artery.  Mild narrowing irregularity M1 segment right middle cerebral artery.  Middle cerebral artery and A2 segment branch vessel narrowing irregularity bilaterally.  Right vertebral artery is dominant. Narrowed left vertebral artery after the takeoff of the PICA.  Poor delineation in the right PICA.  Nonvisualization left AICA.  Narrowed right AICA.  Moderate narrowing P1 segment right posterior cerebral artery with fetal type contribution also noted.  Moderate tandem stenosis of the left posterior cerebral artery branches.  No aneurysm noted.  IMPRESSION: Intracranial atherosclerotic type changes as discussed above.  This has been made a PRA call report utilizing dashboard call feature.   Original Report Authenticated By: Lacy Duverney, M.D.    Microbiology: No results found for this or any previous visit (from the past 240 hour(s)).   Labs: Basic Metabolic Panel:  Recent Labs Lab 12/20/12 1229 12/21/12 0645 12/22/12 0610  NA 141 141 138  K 5.3* 4.3 4.2  CL 105 106 105  CO2 32 26 27  GLUCOSE 102* 102* 113*  BUN 16 17 15   CREATININE 1.26 1.13 1.06  CALCIUM 9.1 9.0 8.7   Liver Function Tests: No results found for this basename: AST,  ALT, ALKPHOS, BILITOT, PROT, ALBUMIN,  in the last 168 hours No results found for this basename: LIPASE, AMYLASE,  in the last 168 hours No results found for this basename: AMMONIA,  in the last 168 hours CBC:  Recent Labs Lab 12/20/12 1229 12/22/12 0610  WBC 8.2 6.9  NEUTROABS 5.0  --   HGB 13.7 13.3  HCT 40.0 38.2*  MCV 94.3 91.0  PLT 170 154   Cardiac Enzymes:  Recent Labs Lab 12/20/12 1229  TROPONINI <0.30   BNP: BNP (last 3 results) No results found for this basename: PROBNP,  in the last 8760 hours CBG: No results found for this basename: GLUCAP,  in the last 168 hours     Signed:  Marlin Canary  [facility] 12/22/2012, 1:07 PM

## 2013-01-07 ENCOUNTER — Encounter (HOSPITAL_BASED_OUTPATIENT_CLINIC_OR_DEPARTMENT_OTHER): Payer: Self-pay | Admitting: *Deleted

## 2013-01-07 ENCOUNTER — Emergency Department (HOSPITAL_BASED_OUTPATIENT_CLINIC_OR_DEPARTMENT_OTHER): Payer: Medicare Other

## 2013-01-07 ENCOUNTER — Inpatient Hospital Stay (HOSPITAL_BASED_OUTPATIENT_CLINIC_OR_DEPARTMENT_OTHER)
Admission: EM | Admit: 2013-01-07 | Discharge: 2013-01-11 | DRG: 293 | Disposition: A | Discharge status: Home / Self Care | Payer: Medicare Other | Attending: Internal Medicine | Admitting: Internal Medicine

## 2013-01-07 DIAGNOSIS — E785 Hyperlipidemia, unspecified: Secondary | ICD-10-CM | POA: Diagnosis present

## 2013-01-07 DIAGNOSIS — I509 Heart failure, unspecified: Secondary | ICD-10-CM | POA: Diagnosis present

## 2013-01-07 DIAGNOSIS — Z8673 Personal history of transient ischemic attack (TIA), and cerebral infarction without residual deficits: Secondary | ICD-10-CM

## 2013-01-07 DIAGNOSIS — F3289 Other specified depressive episodes: Secondary | ICD-10-CM | POA: Diagnosis present

## 2013-01-07 DIAGNOSIS — N400 Enlarged prostate without lower urinary tract symptoms: Secondary | ICD-10-CM

## 2013-01-07 DIAGNOSIS — B9689 Other specified bacterial agents as the cause of diseases classified elsewhere: Secondary | ICD-10-CM | POA: Diagnosis present

## 2013-01-07 DIAGNOSIS — H109 Unspecified conjunctivitis: Secondary | ICD-10-CM | POA: Diagnosis present

## 2013-01-07 DIAGNOSIS — R531 Weakness: Secondary | ICD-10-CM

## 2013-01-07 DIAGNOSIS — IMO0002 Reserved for concepts with insufficient information to code with codable children: Secondary | ICD-10-CM | POA: Diagnosis present

## 2013-01-07 DIAGNOSIS — H9190 Unspecified hearing loss, unspecified ear: Secondary | ICD-10-CM | POA: Diagnosis present

## 2013-01-07 DIAGNOSIS — E875 Hyperkalemia: Secondary | ICD-10-CM

## 2013-01-07 DIAGNOSIS — I6529 Occlusion and stenosis of unspecified carotid artery: Secondary | ICD-10-CM | POA: Diagnosis present

## 2013-01-07 DIAGNOSIS — F329 Major depressive disorder, single episode, unspecified: Secondary | ICD-10-CM

## 2013-01-07 DIAGNOSIS — A499 Bacterial infection, unspecified: Secondary | ICD-10-CM | POA: Diagnosis present

## 2013-01-07 DIAGNOSIS — Z951 Presence of aortocoronary bypass graft: Secondary | ICD-10-CM

## 2013-01-07 DIAGNOSIS — I10 Essential (primary) hypertension: Secondary | ICD-10-CM | POA: Diagnosis present

## 2013-01-07 DIAGNOSIS — R0602 Shortness of breath: Secondary | ICD-10-CM

## 2013-01-07 DIAGNOSIS — N289 Disorder of kidney and ureter, unspecified: Secondary | ICD-10-CM

## 2013-01-07 DIAGNOSIS — I359 Nonrheumatic aortic valve disorder, unspecified: Secondary | ICD-10-CM | POA: Diagnosis present

## 2013-01-07 DIAGNOSIS — G500 Trigeminal neuralgia: Secondary | ICD-10-CM | POA: Diagnosis present

## 2013-01-07 DIAGNOSIS — I5033 Acute on chronic diastolic (congestive) heart failure: Principal | ICD-10-CM | POA: Diagnosis present

## 2013-01-07 DIAGNOSIS — I2510 Atherosclerotic heart disease of native coronary artery without angina pectoris: Secondary | ICD-10-CM | POA: Diagnosis present

## 2013-01-07 DIAGNOSIS — H9193 Unspecified hearing loss, bilateral: Secondary | ICD-10-CM

## 2013-01-07 DIAGNOSIS — I639 Cerebral infarction, unspecified: Secondary | ICD-10-CM

## 2013-01-07 DIAGNOSIS — R569 Unspecified convulsions: Secondary | ICD-10-CM

## 2013-01-07 DIAGNOSIS — G4733 Obstructive sleep apnea (adult) (pediatric): Secondary | ICD-10-CM | POA: Diagnosis present

## 2013-01-07 DIAGNOSIS — I498 Other specified cardiac arrhythmias: Secondary | ICD-10-CM | POA: Diagnosis present

## 2013-01-07 HISTORY — DX: Cerebral infarction, unspecified: I63.9

## 2013-01-07 LAB — COMPREHENSIVE METABOLIC PANEL
ALT: 22 U/L (ref 0–53)
AST: 16 U/L (ref 0–37)
Albumin: 3.5 g/dL (ref 3.5–5.2)
Alkaline Phosphatase: 58 U/L (ref 39–117)
Chloride: 107 mEq/L (ref 96–112)
Potassium: 4 mEq/L (ref 3.5–5.1)
Sodium: 144 mEq/L (ref 135–145)
Total Bilirubin: 0.6 mg/dL (ref 0.3–1.2)
Total Protein: 7.3 g/dL (ref 6.0–8.3)

## 2013-01-07 LAB — URINE MICROSCOPIC-ADD ON

## 2013-01-07 LAB — CBC WITH DIFFERENTIAL/PLATELET
Basophils Absolute: 0 10*3/uL (ref 0.0–0.1)
Basophils Relative: 0 % (ref 0–1)
Eosinophils Absolute: 0.2 10*3/uL (ref 0.0–0.7)
Hemoglobin: 13.6 g/dL (ref 13.0–17.0)
MCHC: 32.8 g/dL (ref 30.0–36.0)
Monocytes Relative: 6 % (ref 3–12)
Neutro Abs: 5.7 10*3/uL (ref 1.7–7.7)
Neutrophils Relative %: 73 % (ref 43–77)
Platelets: 176 10*3/uL (ref 150–400)
RDW: 14.2 % (ref 11.5–15.5)

## 2013-01-07 LAB — URINALYSIS, ROUTINE W REFLEX MICROSCOPIC
Bilirubin Urine: NEGATIVE
Hgb urine dipstick: NEGATIVE
Specific Gravity, Urine: 1.025 (ref 1.005–1.030)
pH: 5.5 (ref 5.0–8.0)

## 2013-01-07 LAB — TROPONIN I: Troponin I: <0.3 ng/mL (ref <0.30)

## 2013-01-07 MED ORDER — ASPIRIN 325 MG PO TABS
325.0000 mg | ORAL_TABLET | Freq: Every day | ORAL | Status: DC
  Administered 2013-01-08 – 2013-01-11 (×4): 325 mg via ORAL
  Filled 2013-01-07 (×5): qty 1

## 2013-01-07 MED ORDER — METHYLPREDNISOLONE SODIUM SUCC 125 MG IJ SOLR
60.0000 mg | Freq: Two times a day (BID) | INTRAMUSCULAR | Status: DC
  Administered 2013-01-07 – 2013-01-08 (×2): 60 mg via INTRAVENOUS
  Filled 2013-01-07 (×3): qty 0.96

## 2013-01-07 MED ORDER — SIMVASTATIN 20 MG PO TABS
20.0000 mg | ORAL_TABLET | Freq: Every day | ORAL | Status: DC
  Administered 2013-01-08 – 2013-01-10 (×3): 20 mg via ORAL
  Filled 2013-01-07 (×5): qty 1

## 2013-01-07 MED ORDER — SODIUM CHLORIDE 0.9 % IJ SOLN: 3.0000 mL | INTRAMUSCULAR | Status: DC | PRN

## 2013-01-07 MED ORDER — HEPARIN SODIUM (PORCINE) 5000 UNIT/ML IJ SOLN
5000.0000 [IU] | Freq: Three times a day (TID) | INTRAMUSCULAR | Status: DC
  Administered 2013-01-07 – 2013-01-11 (×12): 5000 [IU] via SUBCUTANEOUS
  Filled 2013-01-07 (×15): qty 1

## 2013-01-07 MED ORDER — LEVETIRACETAM 500 MG PO TABS
500.0000 mg | ORAL_TABLET | Freq: Two times a day (BID) | ORAL | Status: DC
  Administered 2013-01-07 – 2013-01-11 (×8): 500 mg via ORAL
  Filled 2013-01-07 (×9): qty 1

## 2013-01-07 MED ORDER — SODIUM CHLORIDE 0.9 % IV SOLN: 250.0000 mL | INTRAVENOUS | Status: DC | PRN

## 2013-01-07 MED ORDER — ACETAMINOPHEN 325 MG PO TABS: 650.0000 mg | ORAL_TABLET | ORAL | Status: DC | PRN

## 2013-01-07 MED ORDER — SODIUM CHLORIDE 0.9 % IJ SOLN
3.0000 mL | Freq: Two times a day (BID) | INTRAMUSCULAR | Status: DC
Start: 2013-01-07 — End: 2013-01-11
  Administered 2013-01-07 – 2013-01-11 (×9): 3 mL via INTRAVENOUS

## 2013-01-07 MED ORDER — HYDRALAZINE HCL 20 MG/ML IJ SOLN: 10.0000 mg | Freq: Four times a day (QID) | INTRAMUSCULAR | Status: DC | PRN

## 2013-01-07 MED ORDER — FUROSEMIDE 10 MG/ML IJ SOLN
40.0000 mg | Freq: Two times a day (BID) | INTRAMUSCULAR | Status: DC
  Administered 2013-01-07 – 2013-01-09 (×4): 40 mg via INTRAVENOUS
  Filled 2013-01-07 (×4): qty 4
  Filled 2013-01-07: qty 8
  Filled 2013-01-07: qty 4

## 2013-01-07 MED ORDER — FUROSEMIDE 10 MG/ML IJ SOLN
40.0000 mg | Freq: Once | INTRAMUSCULAR | Status: AC
  Administered 2013-01-07: 40 mg via INTRAVENOUS
  Filled 2013-01-07: qty 4

## 2013-01-07 MED ORDER — METHYLPREDNISOLONE SODIUM SUCC 125 MG IJ SOLR
125.0000 mg | Freq: Once | INTRAMUSCULAR | Status: AC
  Administered 2013-01-07: 125 mg via INTRAVENOUS
  Filled 2013-01-07: qty 2

## 2013-01-07 MED ORDER — CIPROFLOXACIN HCL 0.3 % OP SOLN
2.0000 [drp] | OPHTHALMIC | Status: DC
  Administered 2013-01-07 – 2013-01-10 (×29): 2 [drp] via OPHTHALMIC
  Filled 2013-01-07 (×3): qty 2.5

## 2013-01-07 MED ORDER — ONDANSETRON HCL 4 MG/2ML IJ SOLN: 4.0000 mg | Freq: Four times a day (QID) | INTRAMUSCULAR | Status: DC | PRN

## 2013-01-07 NOTE — ED Notes (Signed)
Patient transported to X-ray 

## 2013-01-07 NOTE — ED Notes (Signed)
Per family patient has been experiencing eye irritation and swelling around his eyes for the past week, SOB for the past two days that has grown worse today with any exertion.

## 2013-01-07 NOTE — Progress Notes (Signed)
Accepted to tele bed for SOB- prob CHF- diasolic and aortic valve stenosis: echo 7/14.  Not sure if anything can be done about patient's valve with age/comorbitities.  Consider: additional views ofthe aortic valve with additional gradient measurements in a limited study IV lasix given in ER- no UOP yet  Marlin Canary

## 2013-01-07 NOTE — ED Notes (Signed)
MD at bedside. 

## 2013-01-07 NOTE — ED Notes (Signed)
Report given to Gunnar Fusi, [employer]. ETA 20 minutes.

## 2013-01-07 NOTE — ED Notes (Signed)
Patient back from X-ray- Placed back on monitor. Pt denies pain. Family at bedside.

## 2013-01-07 NOTE — ED Notes (Signed)
Report given to [place], RN [facility] Room 4E 24C-01

## 2013-01-07 NOTE — ED Provider Notes (Signed)
CSN: [id number]     Arrival date & time 01/07/13  0825 History     First MD Initiated Contact with Patient 01/07/13 [phone]     Chief Complaint  Patient presents with  . Shortness of Breath   (Consider location/radiation/quality/duration/timing/severity/associated sxs/prior Treatment) Patient is a 79 y.o. male presenting with shortness of breath.  Shortness of Breath Severity:  Moderate Onset quality:  Gradual Duration:  2 weeks Timing:  Constant Progression:  Worsening Chronicity:  New Context: activity   Relieved by:  Rest Worsened by:  Exertion Ineffective treatments:  None tried Associated symptoms: no abdominal pain, no chest pain, no cough, no diaphoresis, no fever, no headaches, no hemoptysis, no rash, no sore throat, no sputum production, no syncope, no vomiting and no wheezing   Risk factors comment:  Recent hospitalization for CVA   Past Medical History  Diagnosis Date  . Seizures     Onset in Sept 2006  . Cataract   . Hard of hearing   . Renal disorder   . Depression   . HTN (hypertension)   . BPH (benign prostatic hyperplasia)   . CAD (coronary artery disease)     s/p CABG in 2002  . Hyperlipidemia   . OSA (obstructive sleep apnea)   . Aortic aneurysm     3 CM in 2007  . Carotid stenosis   . DDD (degenerative disc disease)   . Trigeminal neuralgia   . Lower GI bleed   . Stroke    Past Surgical History  Procedure Laterality Date  . Coronary artery bypass graft  2002  . Temporal artery biopsy / ligation  2004   Family History  Problem Relation Age of Onset  . CAD Brother    History  Substance Use Topics  . Smoking status: Never Smoker   . Smokeless tobacco: Not on file  . Alcohol Use: No    Review of Systems  Constitutional: Positive for fatigue. Negative for fever, diaphoresis, activity change and appetite change.  HENT: Negative for congestion, sore throat, facial swelling, rhinorrhea and trouble swallowing.   Eyes: Negative for photophobia  and pain.  Respiratory: Positive for shortness of breath. Negative for cough, hemoptysis, sputum production, chest tightness and wheezing.   Cardiovascular: Negative for chest pain, leg swelling and syncope.  Gastrointestinal: Negative for nausea, vomiting, abdominal pain, diarrhea and constipation.  Endocrine: Negative for polydipsia and polyuria.  Genitourinary: Negative for dysuria, urgency, decreased urine volume and difficulty urinating.  Musculoskeletal: Negative for back pain and gait problem.  Skin: Negative for color change, rash and wound.  Allergic/Immunologic: Negative for immunocompromised state.  Neurological: Positive for weakness. Negative for dizziness, facial asymmetry, speech difficulty, numbness and headaches.  Psychiatric/Behavioral: Negative for confusion, decreased concentration and agitation.    Allergies  Review of patient's allergies indicates no known allergies.  Home Medications   Current Outpatient Rx  Name  Route  Sig  Dispense  Refill  . pravastatin (PRAVACHOL) 40 MG tablet   Oral   Take 40 mg by mouth daily.         Marland Kitchen aspirin 325 MG tablet   Oral   Take 1 tablet (325 mg total) by mouth daily.         Marland Kitchen levETIRAcetam (KEPPRA) 500 MG tablet   Oral   Take 500 mg by mouth every 12 (twelve) hours.         . metoprolol succinate (TOPROL-XL) 100 MG 24 hr tablet   Oral   Take 100 mg  by mouth daily. Take with or immediately following a meal.         . sertraline (ZOLOFT) 25 MG tablet   Oral   Take 1 tablet (25 mg total) by mouth daily.   30 tablet   0    BP 148/62  Pulse 46  Temp(Src) 97.8 F (36.6 C) (Oral)  Resp 17  SpO2 100% Physical Exam  Constitutional: He is oriented to person, place, and time. He appears well-developed and well-nourished. No distress.  HENT:  Head: Normocephalic and atraumatic.  Mouth/Throat: No oropharyngeal exudate.  Eyes: Pupils are equal, round, and reactive to light.  Neck: Normal range of motion. Neck  supple.  Cardiovascular: Regular rhythm and normal heart sounds.  Bradycardia present.  Exam reveals no gallop and no friction rub.   No murmur heard. Pulmonary/Chest: Tachypnea noted. No respiratory distress. He has no wheezes. He has rales in the right lower field and the left lower field.  Abdominal: Soft. Bowel sounds are normal. He exhibits no distension and no mass. There is no tenderness. There is no rebound and no guarding.  Musculoskeletal: Normal range of motion. He exhibits no edema and no tenderness.  Neurological: He is alert and oriented to person, place, and time.  Skin: Skin is warm and dry.  Psychiatric: He has a normal mood and affect.   12:43 PM Transport arrived to take pt in [facility].  Pt has had about 550cc urine out.  Still on 2L [place], appears comfortable.   ED Course   Procedures (including critical care time)  Labs Reviewed  COMPREHENSIVE METABOLIC PANEL - Abnormal; Notable for the following:    Glucose, Bld 144 (*)    GFR calc non Af Amer 45 (*)    GFR calc Af Amer 52 (*)    All other components within normal limits  PRO B NATRIURETIC PEPTIDE - Abnormal; Notable for the following:    Pro B Natriuretic peptide (BNP) 3467.0 (*)    All other components within normal limits  URINALYSIS, ROUTINE W REFLEX MICROSCOPIC - Abnormal; Notable for the following:    Protein, ur 30 (*)    All other components within normal limits  URINE MICROSCOPIC-ADD ON - Abnormal; Notable for the following:    Bacteria, UA MANY (*)    All other components within normal limits  CBC WITH DIFFERENTIAL  TROPONIN I   Dg Chest 2 View  01/07/2013   *RADIOLOGY REPORT*  Clinical Data: Short of breath  CHEST - 2 VIEW  Comparison: 12/20/2012  Findings: Prior CABG.  Cardiac enlargement.  Negative for heart failure.  Negative for infiltrate or effusion.  IMPRESSION: Cardiac enlargement.  No acute abnormality.   Original Report Authenticated By: Janeece Riggers, M.D.   1. CHF, acute      Date:  01/07/2013  Rate: 46  Rhythm: sinus bradycardia  QRS Axis: normal  Intervals: wide QRS  ST/T Wave abnormalities: biphasic t waves V4-6. <44mm ST depression I, II, V3, unchanged from prior  Conduction Disutrbances:right bundle branch block  Narrative Interpretation:   Old EKG Reviewed: unchanged from prior    MDM  Pt is a 79 y.o. male with Pmhx as above who presents with 2 weeks gradual onset generalized weakness, fatigue and SOB, worse past two days now with inc RR.  No fever, cough, CP, ab pain, n/v, d/a, leg swelling/pain.  92% on RA with crackles at bases BL. EKG unchanged, trop not elevated, CXR w/ cardiomegaly.  BNP >3400, cardiomegaly on CXR.  I  believe sx dues to new acute heart failure exacerbation. 40mg  IV lasix given.  [facility] consulted and pt will be admitted to [facility] tele bed.   1. CHF, acute          Shanna Cisco, MD 01/07/13 1244

## 2013-01-07 NOTE — H&P (Signed)
[facility] History and Physical  Larry Bradford ZOX:[id number] DOB: 1916-08-27 DOA: 01/07/2013  Referring physician: er PCP: Georgianne Fick, MD  Specialists:   Chief Complaint: sob  HPI: Larry Bradford is a 79 y.o. male  Who was recently in the hospital for a CVA.  During that visit, he had an echo that showed diastolic dfn and mod aortic stenosis.   For the last 2 weeks, he has been gradually getting SOB.  His SOB is worsened by exertion.  No fever, no chills, no cough- does not endorse orthopnea although the history was difficult due to his loss of hearing.   Not on O2 at home -c/o eye itching-right > left- discharge - rash on skin started fews months ago  In the Er, his BNP was elevated, he was given IV lasix with about output.  He was transferred here for further work up of his SOB   Review of Systems: all systems reviewed, negative unless stated above    Past Medical History  Diagnosis Date  . Seizures     Onset in Sept 2006  . Cataract   . Hard of hearing   . Renal disorder   . Depression   . HTN (hypertension)   . BPH (benign prostatic hyperplasia)   . CAD (coronary artery disease)     s/p CABG in 2002  . Hyperlipidemia   . OSA (obstructive sleep apnea)   . Aortic aneurysm     3 CM in 2007  . Carotid stenosis   . DDD (degenerative disc disease)   . Trigeminal neuralgia   . Lower GI bleed   . Stroke    Past Surgical History  Procedure Laterality Date  . Coronary artery bypass graft  2002  . Temporal artery biopsy / ligation  2004   Social History:  reports that he has never smoked. He does not have any smokeless tobacco history on file. He reports that he does not drink alcohol. His drug history is not on file.   No Known Allergies  Family History  Problem Relation Age of Onset  . CAD Brother     Prior to Admission medications   Medication Sig Start Date End Date Taking? Authorizing Provider  aspirin 325 MG tablet Take 1 tablet  (325 mg total) by mouth daily. 12/22/12  Yes Joseph Art, DO  levETIRAcetam (KEPPRA) 500 MG tablet Take 500 mg by mouth every 12 (twelve) hours.   Yes Historical Provider, MD  metoprolol succinate (TOPROL-XL) 100 MG 24 hr tablet Take 100 mg by mouth daily. Take with or immediately following a meal.   Yes Historical Provider, MD  pravastatin (PRAVACHOL) 40 MG tablet Take 40 mg by mouth daily.   Yes Historical Provider, MD   Physical Exam: Filed Vitals:   01/07/13 1359  BP: 175/73  Pulse: 48  Temp: 97.5 F (36.4 C)  Resp: 18     General:  VERY hard of hearing  Eyes: yellowish d/c from left eye  ENT: wnl  Neck: supple  Cardiovascular: brady  Respiratory: decreased breath sounds  Abdomen: +BS, soft, NT  Skin: areas of rash- dark in color on arms, calves- dry, not weeping  Musculoskeletal: moves all 4 ext  Psychiatric: hard of hearing  Neurologic: CN 2-12 intact  Labs on Admission:  Basic Metabolic Panel:  Recent Labs Lab 01/07/13 0845  NA 144  K 4.0  CL 107  CO2 29  GLUCOSE 144*  BUN 19  CREATININE 1.30  CALCIUM 9.6  Liver Function Tests:  Recent Labs Lab 01/07/13 0845  AST 16  ALT 22  ALKPHOS 58  BILITOT 0.6  PROT 7.3  ALBUMIN 3.5   No results found for this basename: LIPASE, AMYLASE,  in the last 168 hours No results found for this basename: AMMONIA,  in the last 168 hours CBC:  Recent Labs Lab 01/07/13 0845  WBC 7.9  NEUTROABS 5.7  HGB 13.6  HCT 41.5  MCV 98.1  PLT 176   Cardiac Enzymes:  Recent Labs Lab 01/07/13 0845  TROPONINI <0.30    BNP (last 3 results)  Recent Labs  01/07/13 0845  PROBNP 3467.0*   CBG: No results found for this basename: GLUCAP,  in the last 168 hours  Radiological Exams on Admission: Dg Chest 2 View  01/07/2013   *RADIOLOGY REPORT*  Clinical Data: Short of breath  CHEST - 2 VIEW  Comparison: 12/20/2012  Findings: Prior CABG.  Cardiac enlargement.  Negative for heart failure.  Negative for  infiltrate or effusion.  IMPRESSION: Cardiac enlargement.  No acute abnormality.   Original Report Authenticated By: Janeece Riggers, M.D.   EKG unchanged   Assessment/Plan Principal Problem:   SOB (shortness of breath) Active Problems:   Hypertension   Hard of hearing   HTN (hypertension)   Aortic valve disorders   1. SOB- had diastolic dfxn on echo in July with moderate aortic stenosis; IV lasix, check BNP in AM, limited echo- for aortic valve-  2. HTN-stable 3. Bradycardia- hold BB 4. Hard of hearing 5. ?bacterial conjunctivitis- gtts    Code Status: full Family Communication: patient Disposition Plan: admit  Time spent: 70 min  VANN, JESSICA [facility] Pager [phone]  If 7PM-7AM, please contact night-coverage [email or web address] Password [email or web address] 01/07/2013, 2:28 PM

## 2013-01-07 NOTE — ED Notes (Signed)
[facility] at bedside preparing patient for transport.  

## 2013-01-08 DIAGNOSIS — E785 Hyperlipidemia, unspecified: Secondary | ICD-10-CM

## 2013-01-08 DIAGNOSIS — R0602 Shortness of breath: Secondary | ICD-10-CM

## 2013-01-08 DIAGNOSIS — I10 Essential (primary) hypertension: Secondary | ICD-10-CM

## 2013-01-08 LAB — PRO B NATRIURETIC PEPTIDE: Pro B Natriuretic peptide (BNP): 3531 pg/mL — ABNORMAL HIGH (ref 0–450)

## 2013-01-08 LAB — BASIC METABOLIC PANEL
Calcium: 9.2 mg/dL (ref 8.4–10.5)
Creatinine, Ser: 1.33 mg/dL (ref 0.50–1.35)
GFR calc Af Amer: 51 mL/min — ABNORMAL LOW (ref >90)

## 2013-01-08 MED ORDER — AMLODIPINE BESYLATE 5 MG PO TABS
5.0000 mg | ORAL_TABLET | Freq: Every day | ORAL | Status: DC
  Administered 2013-01-08 – 2013-01-11 (×4): 5 mg via ORAL
  Filled 2013-01-08 (×5): qty 1

## 2013-01-08 NOTE — Progress Notes (Signed)
[facility]                                                                                Patient Demographics  Larry Bradford, is a 79 y.o. male, DOB - 1916/06/10, WUJ:[id number], NFA:[id number]  Admit date - 01/07/2013  Admitting Physician Joseph Art, DO  Outpatient Primary MD for the patient is Georgianne Fick, MD  LOS - 1   Chief Complaint  Patient presents with  . Shortness of Breath        Assessment & Plan    SOB - had diastolic dfxn on echo in July with moderate aortic stenosis; gentle IV lasix, check BNP in AM, limited echo - shin shows diastolic dysfunction with preserved EF, also moderate to critical aortic stenosis, cardiology has been consulted. He might need TEE.    HTN-stable, added low-dose Norvasc.    Bradycardia- hold BB, cards called, check TSH    H/O Hard of hearing     Bacterial conjunctivitis- cipro eye drop, vision is intact and he has no periorbital pain or headache. Monitor clinically     Code Status: full  Family Communication: none present  Disposition Plan: home   Procedures    Consults  Cards [place] called   DVT Prophylaxis   Heparin   Lab Results  Component Value Date   PLT 176 01/07/2013    Medications  Scheduled Meds: . aspirin  325 mg Oral Daily  . ciprofloxacin  2 drop Both Eyes Q2H while awake  . furosemide  40 mg Intravenous Q12H  . heparin  5,000 Units Subcutaneous Q8H  . levETIRAcetam  500 mg Oral Q12H  . methylPREDNISolone (SOLU-MEDROL) injection  60 mg Intravenous Q12H  . simvastatin  20 mg Oral q1800  . sodium chloride  3 mL Intravenous Q12H   Continuous Infusions:  PRN Meds:.sodium chloride, acetaminophen, hydrALAZINE, ondansetron (ZOFRAN) IV, sodium chloride  Antibiotics   Anti-infectives   None       Time Spent in minutes  35   Susa Raring K M.D on 01/08/2013 at 1:42 PM  Between 7am to 7pm - Pager - [phone]  After 7pm go to [email or web address] - password  [email or web address]  And look for the night coverage person covering for me after hours  [facility] Office  [phone]    Subjective:   [name] today has, No headache, No chest pain, No abdominal pain - No Nausea, No new weakness tingling or numbness, No Cough - SOB.   Objective:   Filed Vitals:   01/07/13 1359 01/07/13 2047 01/08/13 0115 01/08/13 0610  BP: 175/73 158/66 114/47 136/59  Pulse: 48 52 49 53  Temp: 97.5 F (36.4 C) 97.4 F (36.3 C) 98.2 F (36.8 C) 98.2 F (36.8 C)  TempSrc: Oral Oral Oral Oral  Resp: 18 18 18 18   Height: 5\' 6"  (1.676 m)     Weight: 81.647 kg (180 lb)   80.922 kg (178 lb 6.4 oz)  SpO2: 100% 98% 97% 97%    Wt Readings from Last 3 Encounters:  01/08/13 80.922 kg (178 lb 6.4 oz)  12/20/12 81.647 kg (180 lb)  06/19/11 82.7 kg (182 lb 5.1 oz)     Intake/Output  Summary (Last 24 hours) at 01/08/13 1342 Last data filed at 01/08/13 1305  Gross per 24 hour  Intake    603 ml  Output   2453 ml  Net  -1850 ml    Exam Awake Alert, Oriented X 3, No new F.N deficits, Normal affect [place].AT, left eye conjunctivitis. Supple Neck,No JVD, No cervical lymphadenopathy appriciated.  Symmetrical Chest wall movement, Good air movement bilaterally, CTAB RRR,No Gallops,Rubs or new Murmurs, No Parasternal Heave +ve B.Sounds, Abd Soft, Non tender, No organomegaly appriciated, No rebound - guarding or rigidity. No Cyanosis, Clubbing or edema, No new Rash or bruise     Data Review   Micro Results No results found for this or any previous visit (from the past 240 hour(s)).  Radiology Reports Dg Chest 2 View  01/07/2013   *RADIOLOGY REPORT*  Clinical Data: Short of breath  CHEST - 2 VIEW  Comparison: 12/20/2012  Findings: Prior CABG.  Cardiac enlargement.  Negative for heart failure.  Negative for infiltrate or effusion.  IMPRESSION: Cardiac enlargement.  No acute abnormality.   Original Report Authenticated By: Janeece Riggers, M.D.   Ct Head Wo  Contrast  12/20/2012   *RADIOLOGY REPORT*  Clinical Data: Status post fall 2 days ago.  CT HEAD WITHOUT CONTRAST  Technique:  Contiguous axial images were obtained from the base of the skull through the vertex without contrast.  Comparison: Head CT scan 04/17/2012 and brain MRI 04/18/2012.  Findings: Atrophy and extensive chronic microvascular ischemic change are again seen.  No evidence of acute abnormality including infarction, hemorrhage, mass lesion, mass effect, midline shift or abnormal extra-axial fluid collection is identified.  Mucous retention cyst or polyp in the right maxillary sinus is noted.  The calvarium is intact.  IMPRESSION: No acute finding.  Atrophy and extensive chronic microvascular ischemic change.   Original Report Authenticated By: Holley Dexter, M.D.   Mr Brain Wo Contrast  12/21/2012   *RADIOLOGY REPORT*  Clinical Data:  History of stroke and seizures.  Fall 2 days ago. Overall weakness.  Hypertension.  MRI BRAIN WITHOUT CONTRAST MRA HEAD WITHOUT CONTRAST  Technique: Multiplanar, multiecho pulse sequences of the brain and surrounding structures were obtained according to standard protocol without intravenous contrast.  Angiographic images of the head were obtained using MRA technique without contrast.  Comparison: 12/20/2012 CT.  06/19/2011 brain MR.  MRI HEAD  Findings:  Artifact arises from the small metallic structure adjacent to the right zygoma.  The patient had no difficulty during MR scanning.  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  No intracranial hemorrhage.  No intracranial mass lesion detected on this unenhanced exam.  Moderate small vessel disease type changes.  Global atrophy without hydrocephalus.  Cervical medullary junction, pituitary region, pineal region and orbital structures unremarkable.  Polypoid opacification inferior aspect right maxillary sinus may represent a retention cyst and less likely polyp.  No evidence of mesial temporal sclerosis.   IMPRESSION: Small acute non hemorrhagic infarct left thalamus.  Please see above.  MRA HEAD  Findings: Ectatic internal carotid arteries.  Mild narrowing supraclinoid aspect of the internal carotid artery bilaterally more notable on the left.  Moderate narrowing distal A1 segment right anterior cerebral artery.  Mild narrowing irregularity M1 segment right middle cerebral artery.  Middle cerebral artery and A2 segment branch vessel narrowing irregularity bilaterally.  Right vertebral artery is dominant. Narrowed left vertebral artery after the takeoff of the PICA.  Poor delineation in the right PICA.  Nonvisualization left AICA.  Narrowed right  AICA.  Moderate narrowing P1 segment right posterior cerebral artery with fetal type contribution also noted.  Moderate tandem stenosis of the left posterior cerebral artery branches.  No aneurysm noted.  IMPRESSION: Intracranial atherosclerotic type changes as discussed above.  This has been made a PRA call report utilizing dashboard call feature.   Original Report Authenticated By: Lacy Duverney, M.D.   Dg Chest Portable 1 View  12/20/2012   *RADIOLOGY REPORT*  Clinical Data: Weakness, bradycardia  PORTABLE CHEST - 1 VIEW  Comparison: 10/26/2011  Findings: Evidence of CABG.  Mild prominence the cardiac silhouette may be in part projectional, without evidence for edema.  No pleural effusion.  Partial omission of the left costophrenic angle from the field of view.  No pneumothorax.  Nodular opacity over the right anterior first rib is noted, compatible with superimposition of normal structures as seen on similar prior exam 06/19/2011.  No focal pulmonary opacity.  IMPRESSION: No acute cardiopulmonary process.   Original Report Authenticated By: Christiana Pellant, M.D.   Mr Mra Head/brain Wo Cm  12/21/2012   *RADIOLOGY REPORT*  Clinical Data:  History of stroke and seizures.  Fall 2 days ago. Overall weakness.  Hypertension.  MRI BRAIN WITHOUT CONTRAST MRA HEAD WITHOUT  CONTRAST  Technique: Multiplanar, multiecho pulse sequences of the brain and surrounding structures were obtained according to standard protocol without intravenous contrast.  Angiographic images of the head were obtained using MRA technique without contrast.  Comparison: 12/20/2012 CT.  06/19/2011 brain MR.  MRI HEAD  Findings:  Artifact arises from the small metallic structure adjacent to the right zygoma.  The patient had no difficulty during MR scanning.  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  No intracranial hemorrhage.  No intracranial mass lesion detected on this unenhanced exam.  Moderate small vessel disease type changes.  Global atrophy without hydrocephalus.  Cervical medullary junction, pituitary region, pineal region and orbital structures unremarkable.  Polypoid opacification inferior aspect right maxillary sinus may represent a retention cyst and less likely polyp.  No evidence of mesial temporal sclerosis.  IMPRESSION: Small acute non hemorrhagic infarct left thalamus.  Please see above.  MRA HEAD  Findings: Ectatic internal carotid arteries.  Mild narrowing supraclinoid aspect of the internal carotid artery bilaterally more notable on the left.  Moderate narrowing distal A1 segment right anterior cerebral artery.  Mild narrowing irregularity M1 segment right middle cerebral artery.  Middle cerebral artery and A2 segment branch vessel narrowing irregularity bilaterally.  Right vertebral artery is dominant. Narrowed left vertebral artery after the takeoff of the PICA.  Poor delineation in the right PICA.  Nonvisualization left AICA.  Narrowed right AICA.  Moderate narrowing P1 segment right posterior cerebral artery with fetal type contribution also noted.  Moderate tandem stenosis of the left posterior cerebral artery branches.  No aneurysm noted.  IMPRESSION: Intracranial atherosclerotic type changes as discussed above.  This has been made a PRA call report utilizing dashboard call  feature.   Original Report Authenticated By: Lacy Duverney, M.D.    CBC  Recent Labs Lab 01/07/13 0845  WBC 7.9  HGB 13.6  HCT 41.5  PLT 176  MCV 98.1  MCH 32.2  MCHC 32.8  RDW 14.2  LYMPHSABS 1.4  MONOABS 0.5  EOSABS 0.2  BASOSABS 0.0    Chemistries   Recent Labs Lab 01/07/13 0845 01/08/13 0520  NA 144 143  K 4.0 4.4  CL 107 105  CO2 29 28  GLUCOSE 144* 152*  BUN 19 25*  CREATININE 1.30 1.33  CALCIUM 9.6 9.2  AST 16  --   ALT 22  --   ALKPHOS 58  --   BILITOT 0.6  --    ------------------------------------------------------------------------------------------------------------------ estimated creatinine clearance is 33.9 ml/min (by C-G formula based on Cr of 1.33). ------------------------------------------------------------------------------------------------------------------ No results found for this basename: HGBA1C,  in the last 72 hours ------------------------------------------------------------------------------------------------------------------ No results found for this basename: CHOL, HDL, LDLCALC, TRIG, CHOLHDL, LDLDIRECT,  in the last 72 hours ------------------------------------------------------------------------------------------------------------------ No results found for this basename: TSH, T4TOTAL, FREET3, T3FREE, THYROIDAB,  in the last 72 hours ------------------------------------------------------------------------------------------------------------------ No results found for this basename: VITAMINB12, FOLATE, FERRITIN, TIBC, IRON, RETICCTPCT,  in the last 72 hours  Coagulation profile No results found for this basename: INR, PROTIME,  in the last 168 hours  No results found for this basename: DDIMER,  in the last 72 hours  Cardiac Enzymes  Recent Labs Lab 01/07/13 1415 01/07/13 2010 01/08/13 0520  TROPONINI <0.30 <0.30 <0.30    ------------------------------------------------------------------------------------------------------------------ No components found with this basename: POCBNP,

## 2013-01-08 NOTE — Care Management Note (Signed)
    Page 1 of 2   01/08/2013     11:40:36 AM   CARE MANAGEMENT NOTE 01/08/2013  Patient:  Larry Bradford, Larry Bradford   Account Number:  [id number]  Date Initiated:  01/08/2013  Documentation initiated by:  [facility]  Subjective/Objective Assessment:   79 y.o. male  recently in the hospital for a CVA.  During that visit, he had an echo that showed diastolic dfn and mod aortic stenosis. For the last 2 weeks, he has gradually getting SOB.  His SOB is worsened by exertion. //Hm with spouse     Action/Plan:   diurese//home with Home Health   Anticipated DC Date:  01/11/2013   Anticipated DC Plan:  HOME W HOME HEALTH SERVICES      DC Planning Services  CM consult      [facility] Choice  HOME HEALTH   Choice offered to / List presented to:  C-3 Spouse        HH arranged  HH-1 RN  HH-10 DISEASE MANAGEMENT      HH agency  [facility]   Status of service:  Completed, signed off Medicare Important Message given?   (If response is "NO", the following Medicare IM given date fields will be blank) Date Medicare IM given:   Date Additional Medicare IM given:    Discharge Disposition:    Per UR Regulation:  Reviewed for med. necessity/level of care/duration of stay  If discussed at Long Length of Stay Meetings, dates discussed:    Comments:  01/08/13 1115 Oletta Cohn, RN, BSN, [employer] [phone] Spoke with pt, wife and daughter at bedside regarding discharge planning for Home Health services.  Offered choice of Home Health agencies.  Spouse and pt chose [facility] to render services.  No DME needs identified at this time.

## 2013-01-08 NOTE — Progress Notes (Signed)
Physical Therapy Evaluation Patient Details Name: Larry Bradford MRN: [id number] DOB: [birth year] Today's Date: 01/08/2013 Time: 4696-2952 PT Time Calculation (min): 30 min  PT Assessment / Plan / Recommendation History of Present Illness  Pt presented end of July with 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 He returns now with difficulty breathing and reports dizziness when he was getting up at home.   Clinical Impression  Pt returns to [facility] with generalized weakness, balance deficit, decreased safety, will benefit from acute PT to address these issues as well as HHPT upon d/c and a 4 wheel rollator for balance as well as energy conservation. Request OT consult.    PT Assessment  Patient needs continued PT services    Follow Up Recommendations  Home health PT;Supervision/Assistance - 24 hour    Does the patient have the potential to tolerate intense rehabilitation      Barriers to Discharge Decreased caregiver support assuming wife elderly as well    Equipment Recommendations  Other (comment) (rollator)    Recommendations for Other Services OT consult   Frequency Min 3X/week    Precautions / Restrictions Precautions Precautions: Fall Restrictions Weight Bearing Restrictions: No   Pertinent Vitals/Pain BP sup 112/50    Sit 113/46   Stand  113/96 O2 sats on RA 93% HR 50 bpm      Mobility  Bed Mobility Bed Mobility: Supine to Sit;Sitting - Scoot to Edge of Bed Supine to Sit: 6: Modified independent (Device/Increase time) Sitting - Scoot to Edge of Bed: 6: Modified independent (Device/Increase time) Transfers Transfers: Sit to Stand;Stand to Sit Sit to Stand: 4: Min guard;From bed;With upper extremity assist Stand to Sit: 4: Min guard;To chair/3-in-1;With upper extremity assist Details for Transfer Assistance: pt slightly unsteady with initial standing and reports mild dizziness, did not worsen or improve while up Ambulation/Gait Ambulation/Gait Assistance: 4: Min  assist;4: Min guard Ambulation Distance (Feet): 200 Feet Assistive device: None;Rolling walker Ambulation/Gait Assistance Details: pt bumped into objects on right and with several LOB with min A to correct when not using AD. Given RW and able to ambulate with min-guard A. Gait Pattern: Decreased stride length;Step-through pattern;Wide base of support Gait velocity: decreased Stairs: No Wheelchair Mobility Wheelchair Mobility: No    Exercises     PT Diagnosis: Abnormality of gait;Generalized weakness  PT Problem List: Decreased strength;Decreased activity tolerance;Decreased balance;Decreased mobility;Decreased knowledge of use of DME;Decreased safety awareness;Decreased knowledge of precautions;Decreased cognition PT Treatment Interventions: DME instruction;Gait training;Stair training;Functional mobility training;Therapeutic activities;Therapeutic exercise;Balance training;Cognitive remediation     PT Goals(Current goals can be found in the care plan section) Acute Rehab PT Goals Patient Stated Goal: return home PT Goal Formulation: With patient Time For Goal Achievement: 01/22/13 Potential to Achieve Goals: Good  Visit Information  Last PT Received On: 01/08/13 Assistance Needed: +1 History of Present Illness: Pt presented end of July with 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 He returns now with difficulty breathing and reports dizziness when he was getting up at home.        Prior Functioning  Home Living Family/patient expects to be discharged to:: Private residence Living Arrangements: Spouse/significant other Available Help at Discharge: Family Type of Home: House Home Access: Stairs to enter Secretary/administrator of Steps: 4 Entrance Stairs-Rails: Right Home Layout: One level Home Equipment: Cane - single point Additional Comments: pt reports he was not using any AD to ambulate at home  Lives With: Spouse Prior Function Level of Independence:  Independent Communication Communication: HOH (very) Dominant  Hand: Right    Cognition  Cognition Arousal/Alertness: Awake/alert Behavior During Therapy: WFL for tasks assessed/performed Overall Cognitive Status: History of cognitive impairments - at baseline Memory: Decreased short-term memory    Extremity/Trunk Assessment Upper Extremity Assessment Upper Extremity Assessment: Defer to OT evaluation Lower Extremity Assessment Lower Extremity Assessment: Generalized weakness Cervical / Trunk Assessment Cervical / Trunk Assessment: Kyphotic   Balance Balance Balance Assessed: Yes Static Sitting Balance Static Sitting - Balance Support: No upper extremity supported;Feet supported Static Sitting - Level of Assistance: 5: Stand by assistance Static Sitting - Comment/# of Minutes: 4  End of Session PT - End of Session Equipment Utilized During Treatment: Gait belt Activity Tolerance: Patient tolerated treatment well Patient left: in chair;with call bell/phone within reach Nurse Communication: Mobility status  GP   Lyanne Co, PT  Acute Rehab Services  [phone]   Lyanne Co 01/08/2013, 3:17 PM

## 2013-01-08 NOTE — Consult Note (Signed)
Reason for Consult: SOB Referring Physician: TRH  HPI: The patient is a 79 y/o male, previously followed by Dr. Jenne Campus, who was admitted by [facility] for evaluation for SOB. The patient has not been seen in our office since 2007. He has documented CAD and underwent CABG x 3 in 2002, receiving a vein graft to the circumflex OM, vein graft to the right coronary artery and an internal mammary artery graft to  the mid LAD. He apparently suffered a CVA in July, leading to a 2D echo which revealed normal systolic function with an EF of 60-65%, mild-moderate aortic stenosis and Grade 2 diastolic dysfunction. On arrival to the ED for evaluation of SOB, his BNP was elevated at 3,467. CXR demonstrated cardiomegaly but no significant pulmonary edema. He was started on 40 mg IV lasix BID. Since admission, he reports improvement in breathing. He denies any recent or prior chest pain.    Past Medical History  Diagnosis Date  . Seizures     Onset in Sept 2006  . Cataract   . Hard of hearing   . Renal disorder   . Depression   . HTN (hypertension)   . BPH (benign prostatic hyperplasia)   . CAD (coronary artery disease)     s/p CABG in 2002  . Hyperlipidemia   . OSA (obstructive sleep apnea)   . Aortic aneurysm     3 CM in 2007  . Carotid stenosis   . DDD (degenerative disc disease)   . Trigeminal neuralgia   . Lower GI bleed   . Stroke     Past Surgical History  Procedure Laterality Date  . Coronary artery bypass graft  2002  . Temporal artery biopsy / ligation  2004    Family History  Problem Relation Age of Onset  . CAD Brother     Social History:  reports that he has never smoked. He does not have any smokeless tobacco history on file. He reports that he does not drink alcohol. His drug history is not on file.  Allergies: No Known Allergies  Medications:  Prior to Admission medications   Medication Sig Start Date End Date Taking? Authorizing Provider  aspirin 325 MG tablet Take 1 tablet  (325 mg total) by mouth daily. 12/22/12  Yes Joseph Art, DO  levETIRAcetam (KEPPRA) 500 MG tablet Take 500 mg by mouth every 12 (twelve) hours.   Yes Historical Provider, MD  metoprolol succinate (TOPROL-XL) 100 MG 24 hr tablet Take 100 mg by mouth daily. Take with or immediately following a meal.   Yes Historical Provider, MD  pravastatin (PRAVACHOL) 40 MG tablet Take 40 mg by mouth daily.   Yes Historical Provider, MD     Results for orders placed during the hospital encounter of 01/07/13 (from the past 48 hour(s))  CBC WITH DIFFERENTIAL     Status: None   Collection Time    01/07/13  8:45 AM      Result Value Range   WBC 7.9  4.0 - 10.5 K/uL   RBC 4.23  4.22 - 5.81 MIL/uL   Hemoglobin 13.6  13.0 - 17.0 g/dL   HCT 16.1  09.6 - 04.5 %   MCV 98.1  78.0 - 100.0 fL   MCH 32.2  26.0 - 34.0 pg   MCHC 32.8  30.0 - 36.0 g/dL   RDW 40.9  81.1 - 91.4 %   Platelets 176  150 - 400 K/uL   Neutrophils Relative % 73  43 -  77 %   Neutro Abs 5.7  1.7 - 7.7 K/uL   Lymphocytes Relative 17  12 - 46 %   Lymphs Abs 1.4  0.7 - 4.0 K/uL   Monocytes Relative 6  3 - 12 %   Monocytes Absolute 0.5  0.1 - 1.0 K/uL   Eosinophils Relative 3  0 - 5 %   Eosinophils Absolute 0.2  0.0 - 0.7 K/uL   Basophils Relative 0  0 - 1 %   Basophils Absolute 0.0  0.0 - 0.1 K/uL  COMPREHENSIVE METABOLIC PANEL     Status: Abnormal   Collection Time    01/07/13  8:45 AM      Result Value Range   Sodium 144  135 - 145 mEq/L   Potassium 4.0  3.5 - 5.1 mEq/L   Chloride 107  96 - 112 mEq/L   CO2 29  19 - 32 mEq/L   Glucose, Bld 144 (*) 70 - 99 mg/dL   BUN 19  6 - 23 mg/dL   Creatinine, Ser 1.61  0.50 - 1.35 mg/dL   Calcium 9.6  8.4 - 09.6 mg/dL   Total Protein 7.3  6.0 - 8.3 g/dL   Albumin 3.5  3.5 - 5.2 g/dL   AST 16  0 - 37 U/L   ALT 22  0 - 53 U/L   Alkaline Phosphatase 58  39 - 117 U/L   Total Bilirubin 0.6  0.3 - 1.2 mg/dL   GFR calc non Af Amer 45 (*) >90 mL/min   GFR calc Af Amer 52 (*) >90 mL/min    Comment:            The eGFR has been calculated     using the CKD EPI equation.     This calculation has not been     validated in all clinical     situations.     eGFR's persistently     <90 mL/min signify     possible Chronic Kidney Disease.  TROPONIN I     Status: None   Collection Time    01/07/13  8:45 AM      Result Value Range   Troponin I <0.30  <0.30 ng/mL   Comment:            Due to the release kinetics of cTnI,     a negative result within the first hours     of the onset of symptoms does not rule out     myocardial infarction with certainty.     If myocardial infarction is still suspected,     repeat the test at appropriate intervals.  PRO B NATRIURETIC PEPTIDE     Status: Abnormal   Collection Time    01/07/13  8:45 AM      Result Value Range   Pro B Natriuretic peptide (BNP) 3467.0 (*) 0 - 450 pg/mL  URINALYSIS, ROUTINE W REFLEX MICROSCOPIC     Status: Abnormal   Collection Time    01/07/13  9:04 AM      Result Value Range   Color, Urine YELLOW  YELLOW   APPearance CLEAR  CLEAR   Specific Gravity, Urine 1.025  1.005 - 1.030   pH 5.5  5.0 - 8.0   Glucose, UA NEGATIVE  NEGATIVE mg/dL   Hgb urine dipstick NEGATIVE  NEGATIVE   Bilirubin Urine NEGATIVE  NEGATIVE   Ketones, ur NEGATIVE  NEGATIVE mg/dL   Protein, ur 30 (*) NEGATIVE mg/dL  Urobilinogen, UA 0.2  0.0 - 1.0 mg/dL   Nitrite NEGATIVE  NEGATIVE   Leukocytes, UA NEGATIVE  NEGATIVE  URINE MICROSCOPIC-ADD ON     Status: Abnormal   Collection Time    01/07/13  9:04 AM      Result Value Range   WBC, UA 0-2  <3 WBC/hpf   Bacteria, UA MANY (*) RARE   Urine-Other MUCOUS PRESENT    TROPONIN I     Status: None   Collection Time    01/07/13  2:15 PM      Result Value Range   Troponin I <0.30  <0.30 ng/mL   Comment:            Due to the release kinetics of cTnI,     a negative result within the first hours     of the onset of symptoms does not rule out     myocardial infarction with certainty.      If myocardial infarction is still suspected,     repeat the test at appropriate intervals.  TROPONIN I     Status: None   Collection Time    01/07/13  8:10 PM      Result Value Range   Troponin I <0.30  <0.30 ng/mL   Comment:            Due to the release kinetics of cTnI,     a negative result within the first hours     of the onset of symptoms does not rule out     myocardial infarction with certainty.     If myocardial infarction is still suspected,     repeat the test at appropriate intervals.  BASIC METABOLIC PANEL     Status: Abnormal   Collection Time    01/08/13  5:20 AM      Result Value Range   Sodium 143  135 - 145 mEq/L   Potassium 4.4  3.5 - 5.1 mEq/L   Chloride 105  96 - 112 mEq/L   CO2 28  19 - 32 mEq/L   Glucose, Bld 152 (*) 70 - 99 mg/dL   BUN 25 (*) 6 - 23 mg/dL   Creatinine, Ser 1.61  0.50 - 1.35 mg/dL   Calcium 9.2  8.4 - 09.6 mg/dL   GFR calc non Af Amer 44 (*) >90 mL/min   GFR calc Af Amer 51 (*) >90 mL/min   Comment:            The eGFR has been calculated     using the CKD EPI equation.     This calculation has not been     validated in all clinical     situations.     eGFR's persistently     <90 mL/min signify     possible Chronic Kidney Disease.  PRO B NATRIURETIC PEPTIDE     Status: Abnormal   Collection Time    01/08/13  5:20 AM      Result Value Range   Pro B Natriuretic peptide (BNP) 3531.0 (*) 0 - 450 pg/mL  TROPONIN I     Status: None   Collection Time    01/08/13  5:20 AM      Result Value Range   Troponin I <0.30  <0.30 ng/mL   Comment:            Due to the release kinetics of cTnI,     a negative result within the first hours  of the onset of symptoms does not rule out     myocardial infarction with certainty.     If myocardial infarction is still suspected,     repeat the test at appropriate intervals.    Dg Chest 2 View  01/07/2013   *RADIOLOGY REPORT*  Clinical Data: Short of breath  CHEST - 2 VIEW  Comparison:  12/20/2012  Findings: Prior CABG.  Cardiac enlargement.  Negative for heart failure.  Negative for infiltrate or effusion.  IMPRESSION: Cardiac enlargement.  No acute abnormality.   Original Report Authenticated By: Janeece Riggers, M.D.    Review of Systems  Respiratory: Positive for shortness of breath.   Cardiovascular: Negative for chest pain and leg swelling.  All other systems reviewed and are negative.   Blood pressure 136/59, pulse 53, temperature 98.2 F (36.8 C), temperature source Oral, resp. rate 18, height 5\' 6"  (1.676 m), weight 178 lb 6.4 oz (80.922 kg), SpO2 97.00%. Physical Exam  Constitutional: He is oriented to person, place, and time. He appears well-developed and well-nourished. No distress.  Neck: No JVD present. Carotid bruit is not present.  Cardiovascular: Normal rate, regular rhythm and intact distal pulses.  Exam reveals no gallop and no friction rub.   Murmur (1/6 SM) heard. Pulses:      Radial pulses are 2+ on the right side, and 2+ on the left side.       Dorsalis pedis pulses are 2+ on the right side, and 2+ on the left side.  Respiratory: Effort normal and breath sounds normal. No respiratory distress. He has no rales.  Musculoskeletal: He exhibits no edema.  Neurological: He is alert and oriented to person, place, and time.  Skin: Skin is warm and dry. He is not diaphoretic.  Psychiatric: He has a normal mood and affect. His behavior is normal.    Assessment/Plan: Principal Problem:   SOB (shortness of breath) Active Problems:   Hypertension   Hard of hearing   HTN (hypertension)   Aortic valve disorders  Plan: Pt admitted for evaluation of SOB. BNP on admission was elevated ~3,500. CXR demonstrated very little edema. Pt has been receiving BID IV Lasix. Today, he notes subjective improvement in breathing. Lungs are CTAB. He has no JVD and no significant peripheral edema. I agree with gentle diuresis. Recommend rechecking BNP in the AM. Will re-evaluate  and if better, will consider switching to PO diuretic and hopefully home in 1-2 days. He denies any recent or prior chest pain. No EKG on file. Will order 12 lead for baseline. MD to follow.Marland Kitchen  Allayne Butcher, PA-C 01/08/2013, 2:31 PM     Agree with note written by Boyce Medici  PAC  Pt with H/O CAD s/p CABG X 3 2002. Nl LV fxn by 2D last month when he was admitted for a CVA. He has mild to moderate AS with an AVA of 1 cm2. Admitted now with SOB. BNP elevated at 3500. Cardiac enz neg. EKG not done. CXR NAD. Exam benign . Soft outflow murmur. Lungs clear. No JVP or periph edema. Agree with gentle diuresis. Will prob need to go home on a diuretic. Will follow.   Runell Gess 01/08/2013 4:25 PM

## 2013-01-08 NOTE — Progress Notes (Signed)
Pt is alert and oriented x4. Pt has received eye drops bilaterally Q2H for possible pink eye. Pt has had no complaints of pain. Pt is putting out clear and yellow urine.

## 2013-01-08 NOTE — Progress Notes (Signed)
Pt. Refused labs at 0200 (troponin, bmet, and bnp).  Will try to draw labs later this morning.

## 2013-01-09 DIAGNOSIS — H1089 Other conjunctivitis: Secondary | ICD-10-CM

## 2013-01-09 DIAGNOSIS — N400 Enlarged prostate without lower urinary tract symptoms: Secondary | ICD-10-CM

## 2013-01-09 DIAGNOSIS — A499 Bacterial infection, unspecified: Secondary | ICD-10-CM

## 2013-01-09 DIAGNOSIS — H109 Unspecified conjunctivitis: Secondary | ICD-10-CM | POA: Diagnosis present

## 2013-01-09 LAB — BASIC METABOLIC PANEL
BUN: 39 mg/dL — ABNORMAL HIGH (ref 6–23)
Calcium: 9.1 mg/dL (ref 8.4–10.5)
GFR calc non Af Amer: 42 mL/min — ABNORMAL LOW (ref >90)
Glucose, Bld: 132 mg/dL — ABNORMAL HIGH (ref 70–99)
Potassium: 4.5 mEq/L (ref 3.5–5.1)

## 2013-01-09 MED ORDER — FUROSEMIDE 10 MG/ML IJ SOLN: 40.0000 mg | Freq: Every day | INTRAMUSCULAR | Status: DC

## 2013-01-09 MED ORDER — FUROSEMIDE 40 MG PO TABS
40.0000 mg | ORAL_TABLET | Freq: Every day | ORAL | Status: DC
  Administered 2013-01-09 – 2013-01-11 (×3): 40 mg via ORAL
  Filled 2013-01-09 (×3): qty 1

## 2013-01-09 NOTE — Progress Notes (Signed)
Physical Therapy Treatment Patient Details Name: Larry Bradford MRN: [id number] DOB: [birth year] Today's Date: 01/09/2013 Time: 2956-2130 PT Time Calculation (min): 23 min  PT Assessment / Plan / Recommendation  History of Present Illness Pt presented end of July with 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 He returns now with difficulty breathing and reports dizziness when he was getting up at home.    PT Comments   Pt pleasant & willing to participate in PT session.  Cont recommend HHPT & 4 wheel rollator at d/c  Follow Up Recommendations  Home health PT;Supervision/Assistance - 24 hour     Does the patient have the potential to tolerate intense rehabilitation     Barriers to Discharge        Equipment Recommendations  Other (comment) (rollator)    Recommendations for Other Services OT consult  Frequency Min 3X/week   Progress towards PT Goals Progress towards PT goals: Progressing toward goals  Plan Current plan remains appropriate    Precautions / Restrictions Precautions Precautions: Fall Restrictions Weight Bearing Restrictions: No   Pertinent Vitals/Pain No pain reported.      Mobility  Bed Mobility Bed Mobility: Supine to Sit;Sitting - Scoot to Edge of Bed Supine to Sit: 6: Modified independent (Device/Increase time) Sitting - Scoot to Edge of Bed: 6: Modified independent (Device/Increase time) Transfers Transfers: Sit to Stand;Stand to Sit Sit to Stand: 4: Min guard;With upper extremity assist;With armrests;From bed;From chair/3-in-1 Stand to Sit: 4: Min guard;With upper extremity assist;With armrests;To chair/3-in-1 Details for Transfer Assistance: cues for safe hand placement.  Pt performed 5x's for strengthening, activity tolerance, & technique.   Ambulation/Gait Ambulation/Gait Assistance: 4: Min guard;4: Min Environmental consultant (Feet): 200 Feet Assistive device: Rolling walker Ambulation/Gait Assistance Details: guarding for safety.   Cues for  safe use of RW & to avoid obstacles.  Pt required min assist to manuever RW around objects  Gait Pattern: Step-through pattern;Decreased stride length General Gait Details: 02 sats 93% RA Stairs: No Wheelchair Mobility Wheelchair Mobility: No      PT Goals (current goals can now be found in the care plan section) Acute Rehab PT Goals PT Goal Formulation: With patient Time For Goal Achievement: 01/22/13 Potential to Achieve Goals: Good  Visit Information  Last PT Received On: 01/09/13 Assistance Needed: +1 History of Present Illness: Pt presented end of July with 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 He returns now with difficulty breathing and reports dizziness when he was getting up at home.     Subjective Data      Cognition  Cognition Arousal/Alertness: Awake/alert Behavior During Therapy: WFL for tasks assessed/performed Overall Cognitive Status: History of cognitive impairments - at baseline    Balance     End of Session PT - End of Session Equipment Utilized During Treatment: Gait belt Activity Tolerance: Patient tolerated treatment well Patient left: in chair;with call bell/phone within reach;with family/visitor present Nurse Communication: Mobility status   GP     Lara Mulch 01/09/2013, 2:25 PM  Verdell Face, PTA [phone] 01/09/2013

## 2013-01-09 NOTE — Progress Notes (Signed)
Pt is alert and oriented x4. Pt is bilateral HOH. Pt is receiving Q2H bilateral eye drops for pink eye. Pt is still on 2L [place]. Pt has not had any complaints of pain

## 2013-01-09 NOTE — Progress Notes (Signed)
Patient ID: Larry Bradford, male   DOB: 12-Jun-1916, 79 y.o.   MRN: [id number] [facility] Progress Note  01/09/2013   Subjective: Pt reports that he is feeling better.  No difficulty breathing.  No chest pain.  Less eye irritation noted today.   Objective:  Vital signs in last 24 hours: Filed Vitals:   01/08/13 1450 01/08/13 1452 01/08/13 2141 01/09/13 0635  BP: 113/46 113/96 125/53 120/51  Pulse:  50 51 53  Temp:   98.1 F (36.7 C) 97.9 F (36.6 C)  TempSrc:   Oral Oral  Resp:   20 18  Height:      Weight:    80.241 kg (176 lb 14.4 oz)  SpO2:  93% 95% 93%   Weight change: -1.406 kg (-3 lb 1.6 oz)  Intake/Output Summary (Last 24 hours) at 01/09/13 1044 Last data filed at 01/09/13 0820  Gross per 24 hour  Intake    840 ml  Output   1000 ml  Net   -160 ml   Lab Results  Component Value Date   HGBA1C 5.6 12/21/2012   Lab Results  Component Value Date   LDLCALC 74 12/21/2012   CREATININE 1.39* 01/09/2013    Review of Systems As above, otherwise all reviewed and reported negative  Physical Exam General - awake, no distress, cooperative HEENT - NCAT, MM dry,  conjuctivitis both eyes with purulent drainage Lungs - BBS shallow but fairly clear CV - normal s1, s2 sounds Abd - soft, nondistended, no masses, nontender Ext - no cyanosis, trace to no edema    Lab Results: Results for orders placed during the hospital encounter of 01/07/13 (from the past 24 hour(s))  TSH     Status: None   Collection Time    01/08/13  2:24 PM      Result Value Range   TSH 1.035  0.350 - 4.500 uIU/mL  BASIC METABOLIC PANEL     Status: Abnormal   Collection Time    01/09/13  4:50 AM      Result Value Range   Sodium 141  135 - 145 mEq/L   Potassium 4.5  3.5 - 5.1 mEq/L   Chloride 101  96 - 112 mEq/L   CO2 27  19 - 32 mEq/L   Glucose, Bld 132 (*) 70 - 99 mg/dL   BUN 39 (*) 6 - 23 mg/dL   Creatinine, Ser 9.56 (*) 0.50 - 1.35 mg/dL   Calcium 9.1  8.4 - 21.3 mg/dL   GFR calc  non Af Amer 42 (*) >90 mL/min   GFR calc Af Amer 48 (*) >90 mL/min    Micro Results: No results found for this or any previous visit (from the past 240 hour(s)).  Medications:  Scheduled Meds: . amLODipine  5 mg Oral Daily  . aspirin  325 mg Oral Daily  . ciprofloxacin  2 drop Both Eyes Q2H while awake  . furosemide  40 mg Intravenous Q12H  . heparin  5,000 Units Subcutaneous Q8H  . levETIRAcetam  500 mg Oral Q12H  . simvastatin  20 mg Oral q1800  . sodium chloride  3 mL Intravenous Q12H   Continuous Infusions:  PRN Meds:.sodium chloride, acetaminophen, hydrALAZINE, ondansetron (ZOFRAN) IV, sodium chloride  Assessment/Plan: SOB - improved symptoms with gentle diuresis, had diastolic dfxn on echo in July with moderate aortic stenosis; gentle lasix, check BNP in AM    HTN-stable, added low-dose Norvasc.   Bradycardia- hold BB, cards following , TSH within  normal limits    H/O Hard of hearing   Bacterial conjunctivitis- cipro eye drop, vision is intact and he has no periorbital pain or headache. Monitor clinically  Dispo: likey to be discharged soon with home health    LOS: 2 days   Kennidy Lamke 01/09/2013, 10:44 AM   Rodney Langton, MD, CDE, FAAFP [facility] [place], [place]  Digital Pager [phone]

## 2013-01-09 NOTE — Progress Notes (Addendum)
[facility]  Subjective: No complaints. Denies further SOB.  Objective: Vital signs in last 24 hours: Temp:  [97.9 F (36.6 C)-98.1 F (36.7 C)] 97.9 F (36.6 C) (08/12 0635) Pulse Rate:  [50-53] 53 (08/12 0635) Resp:  [18-20] 18 (08/12 0635) BP: (112-125)/(46-96) 120/51 mmHg (08/12 0635) SpO2:  [93 %-95 %] 93 % (08/12 0635) Weight:  [176 lb 14.4 oz (80.241 kg)] 176 lb 14.4 oz (80.241 kg) (08/12 0635) Last BM Date: 01/07/13  Intake/Output from previous day: 08/11 0701 - 08/12 0700 In: 600 [P.O.:600] Out: 1000 [Urine:1000] Intake/Output this shift: Total I/O In: 240 [P.O.:240] Out: -   Medications Current Facility-Administered Medications  Medication Dose Route Frequency Provider Last Rate Last Dose  . 0.9 %  sodium chloride infusion  250 mL Intravenous PRN Larry Art, DO      . acetaminophen (TYLENOL) tablet 650 mg  650 mg Oral Q4H PRN Larry Art, DO      . amLODipine (NORVASC) tablet 5 mg  5 mg Oral Daily Larry Sea, MD   5 mg at 01/09/13 1002  . aspirin tablet 325 mg  325 mg Oral Daily Larry Art, DO   325 mg at 01/09/13 1002  . ciprofloxacin (CILOXAN) 0.3 % ophthalmic solution 2 drop  2 drop Both Eyes Q2H while awake Larry Art, DO   2 drop at 01/09/13 1002  . furosemide (LASIX) injection 40 mg  40 mg Intravenous Q12H Larry Art, DO   40 mg at 01/09/13 0824  . heparin injection 5,000 Units  5,000 Units Subcutaneous Q8H Larry Art, DO   5,000 Units at 01/09/13 1610  . hydrALAZINE (APRESOLINE) injection 10 mg  10 mg Intravenous Q6H PRN Larry Art, DO      . levETIRAcetam (KEPPRA) tablet 500 mg  500 mg Oral Q12H Larry U Vann, DO   500 mg at 01/09/13 1002  . ondansetron (ZOFRAN) injection 4 mg  4 mg Intravenous Q6H PRN Larry Art, DO      . simvastatin (ZOCOR) tablet 20 mg  20 mg Oral q1800 Larry Art, DO   20 mg at 01/08/13 1729  . sodium chloride 0.9 % injection 3 mL  3 mL Intravenous Q12H Larry U  Vann, DO   3 mL at 01/09/13 1003  . sodium chloride 0.9 % injection 3 mL  3 mL Intravenous PRN Larry Art, DO        PE: General appearance: alert, cooperative and no distress Lungs: clear to auscultation bilaterally Heart: regular rate and rhythm and 1/6 SM Extremities: no LEE Pulses: 2+ and symmetric Skin: warm and dry Neurologic: Grossly normal  Lab Results:   Recent Labs  01/07/13 0845  WBC 7.9  HGB 13.6  HCT 41.5  PLT 176   BMET  Recent Labs  01/07/13 0845 01/08/13 0520 01/09/13 0450  NA 144 143 141  K 4.0 4.4 4.5  CL 107 105 101  CO2 29 28 27   GLUCOSE 144* 152* 132*  BUN 19 25* 39*  CREATININE 1.30 1.33 1.39*  CALCIUM 9.6 9.2 9.1   BNP (last 3 results)  Recent Labs  01/07/13 0845 01/08/13 0520  PROBNP 3467.0* 3531.0*    Assessment/Plan  Principal Problem:   SOB (shortness of breath) Active Problems:   Hypertension   Hard of hearing   HTN (hypertension)   Aortic valve disorders  Plan: Diuresis slowed yesterday with net daily output of 400 ml. Scr increased  slightly to 1.39. BUN is elevated at 39. Consider switching to PO Lasix today. Will recheck BNP in the am. EKG was ordered yesterday for established baseline EKG, however wasn't done. Will ask RN to obtain one today.     LOS: 2 days    Larry Bradford 01/09/2013 10:31 AM  I have seen and examined the patient along with Larry M. Sharol Harness, PA-C.  I have reviewed the chart, notes and new data.  I agree with PA's note.  Key new complaints: wife thinks he is more depressed today. He walked in hallway without a lot of difficulty Key examination changes: no rales, lying fully flat in bed (very deaf) Key new findings / data: creat abnormal, but probably at baseline (same in 2010); ECG shows NSR, PACs, RBBB, prominent repol abnormalities (also chronic)  PLAN: Switch to PO antibiotics and continue daily outpatient ABx. His wife identifies Larry Bradford as being Larry Bradford  Cardiologist (he was seen by [place] in 2007, subsequently followed by Larry Bradford and then Larry Bradford). D/w Larry Bradford - he will let us continue taking care of him until discharge (maybe as early as tomorrow) and then will see Larry Bradford in follow up. Larry Fair, MD, [facility] [phone] 01/09/2013, 4:24 PM

## 2013-01-10 LAB — BASIC METABOLIC PANEL
BUN: 35 mg/dL — ABNORMAL HIGH (ref 6–23)
CO2: 34 mEq/L — ABNORMAL HIGH (ref 19–32)
Chloride: 100 mEq/L (ref 96–112)
Creatinine, Ser: 1.32 mg/dL (ref 0.50–1.35)
Glucose, Bld: 115 mg/dL — ABNORMAL HIGH (ref 70–99)
Potassium: 5 mEq/L (ref 3.5–5.1)

## 2013-01-10 MED ORDER — ACYCLOVIR 800 MG PO TABS
800.0000 mg | ORAL_TABLET | Freq: Every day | ORAL | Status: DC
  Administered 2013-01-10 – 2013-01-11 (×5): 800 mg via ORAL
  Filled 2013-01-10 (×9): qty 1

## 2013-01-10 MED ORDER — POLYVINYL ALCOHOL 1.4 % OP SOLN
1.0000 [drp] | Freq: Four times a day (QID) | OPHTHALMIC | Status: DC
  Administered 2013-01-10 – 2013-01-11 (×2): 1 [drp] via OPHTHALMIC
  Filled 2013-01-10 (×2): qty 15

## 2013-01-10 MED ORDER — PREDNISOLONE ACETATE 0.12 % OP SUSP
1.0000 [drp] | Freq: Four times a day (QID) | OPHTHALMIC | Status: DC
  Filled 2013-01-10: qty 5

## 2013-01-10 MED ORDER — PREDNISOLONE ACETATE 1 % OP SUSP
1.0000 [drp] | Freq: Four times a day (QID) | OPHTHALMIC | Status: DC
  Administered 2013-01-10 – 2013-01-11 (×2): 1 [drp] via OPHTHALMIC
  Filled 2013-01-10: qty 1

## 2013-01-10 MED ORDER — ACYCLOVIR 200 MG PO CAPS
800.0000 mg | ORAL_CAPSULE | Freq: Every day | ORAL | Status: DC
  Filled 2013-01-10 (×4): qty 4

## 2013-01-10 MED ORDER — METOPROLOL TARTRATE 12.5 MG HALF TABLET
12.5000 mg | ORAL_TABLET | Freq: Two times a day (BID) | ORAL | Status: DC
  Administered 2013-01-10 – 2013-01-11 (×2): 12.5 mg via ORAL
  Filled 2013-01-10 (×4): qty 1

## 2013-01-10 MED ORDER — MAGNESIUM HYDROXIDE 400 MG/5ML PO SUSP
30.0000 mL | Freq: Every day | ORAL | Status: DC | PRN
  Administered 2013-01-10: 30 mL via ORAL
  Filled 2013-01-10: qty 30

## 2013-01-10 NOTE — Progress Notes (Signed)
[facility] Progress Note  01/10/2013  Subjective: Pt reports that his left eye is burning.  No CP or SOB.  Also, left eye has been more swollen.    Objective:  Vital signs in last 24 hours: Filed Vitals:   01/09/13 0635 01/09/13 1419 01/09/13 2100 01/10/13 0636  BP: 120/51 126/39 134/71 114/50  Pulse: 53 52 55 55  Temp: 97.9 F (36.6 C) 97.5 F (36.4 C) 97.8 F (36.6 C) 97.3 F (36.3 C)  TempSrc: Oral Oral Oral Oral  Resp: 18 18 18 19   Height:      Weight: 80.241 kg (176 lb 14.4 oz)   80 kg (176 lb 5.9 oz)  SpO2: 93% 98% 93% 92%   Weight change: -0.241 kg (-8.5 oz)  Intake/Output Summary (Last 24 hours) at 01/10/13 1130 Last data filed at 01/10/13 1610  Gross per 24 hour  Intake    480 ml  Output    450 ml  Net     30 ml   Lab Results  Component Value Date   HGBA1C 5.6 12/21/2012   Lab Results  Component Value Date   LDLCALC 74 12/21/2012   CREATININE 1.32 01/10/2013    Review of Systems As above, otherwise all reviewed and reported negative  Physical Exam General - awake, no distress, cooperative HEENT - NCAT, MMM, left eye swollen, conjunctiva red, herpetic appearing lesions seen  Lungs - BBS, CTA CV - normal s1, s2 sounds Abd - soft, nondistended, no masses, nontender Ext - no cyanosis  Lab Results: Results for orders placed during the hospital encounter of 01/07/13 (from the past 24 hour(s))  BASIC METABOLIC PANEL     Status: Abnormal   Collection Time    01/10/13  5:35 AM      Result Value Range   Sodium 142  135 - 145 mEq/L   Potassium 5.0  3.5 - 5.1 mEq/L   Chloride 100  96 - 112 mEq/L   CO2 34 (*) 19 - 32 mEq/L   Glucose, Bld 115 (*) 70 - 99 mg/dL   BUN 35 (*) 6 - 23 mg/dL   Creatinine, Ser 9.60  0.50 - 1.35 mg/dL   Calcium 9.0  8.4 - 45.4 mg/dL   GFR calc non Af Amer 44 (*) >90 mL/min   GFR calc Af Amer 52 (*) >90 mL/min  PRO B NATRIURETIC PEPTIDE     Status: Abnormal   Collection Time    01/10/13  5:35 AM      Result Value Range    Pro B Natriuretic peptide (BNP) 997.6 (*) 0 - 450 pg/mL    Micro Results: No results found for this or any previous visit (from the past 240 hour(s)).  Medications:  Scheduled Meds: . acyclovir  800 mg Oral 5 X Daily  . amLODipine  5 mg Oral Daily  . aspirin  325 mg Oral Daily  . ciprofloxacin  2 drop Both Eyes Q2H while awake  . furosemide  40 mg Oral Daily  . heparin  5,000 Units Subcutaneous Q8H  . levETIRAcetam  500 mg Oral Q12H  . simvastatin  20 mg Oral q1800  . sodium chloride  3 mL Intravenous Q12H   Continuous Infusions:  PRN Meds:.sodium chloride, acetaminophen, hydrALAZINE, ondansetron (ZOFRAN) IV, sodium chloride  Assessment/Plan:  Question of herpes opthalmicus - I called and spoke with opthalmologist on call, will see patient - started acyclovir 800 mg po 5x per day  SOB - improved symptoms with gentle diuresis, had  diastolic dfxn on echo in July with moderate aortic stenosis; gentle lasix, monitoring BMP  HTN-stable, added low-dose Norvasc.   Bradycardia- hold BB, cards following , TSH within normal limits   H/O Hard of hearing    LOS: 3 days   Yader Criger 01/10/2013, 11:30 AM   Rodney Langton, MD, CDE, FAAFP [facility] [place], [place]  Digital Pager [phone]

## 2013-01-10 NOTE — Progress Notes (Signed)
Patient had a run of SVT for approximately 3 seconds. The patient went into the 160's and came back down to the 70's. RN was in room with the patient during this episode. Pt is asymptomatic, sitting in chair talking to his family. Pt asked if he felt like his heart was racing, he said no. Pt had no complaints of pain or SOB. Dr. Laural Benes paged. Dr. Laural Benes informed the RN to continue to watch the patient, no new orders received at this time. RN will continue to monitor.

## 2013-01-10 NOTE — Progress Notes (Signed)
Pt is alert and oriented x4. Pt has bilateral HOH. Pts left eye looks worse upon morning examination. MD informed. MD ordered PO ABX to be started and a consult put in for an opthalmologist. Pt has had no complaints of pain. Pt is complaining of constipation and PRN medications ordered for patient. Pt is on 2L [place]. Will continue to monitor

## 2013-01-10 NOTE — Progress Notes (Signed)
Pt had episode of sustained SVT.  Came to assess pt and found that he was lying in bed and says that he felt "weak" BP was stable.  Temp afebrile.  Called and spoke with [facility] cardiology on call and they recommended lopressor 12.5 mg po bid.  Will order and follow.   Maryln Manuel, MD

## 2013-01-10 NOTE — Consult Note (Signed)
  79yo male who has been treated for pink-eye with antibiotic drops is not getting better, and has periorbital erythema OS>OD, periorbital edema, mostly OS, and crusting of lids OU with possible vescicle formation on  the left lower lid. Pt has a history of cataract surgery approx 30 years ago OU  VA: OD: 20/30        OS: 20/30-2 Pupils: 4/4 RRRL  No APD EOMs: Full OU [place] Exam: Lids/Lashes: Moderate blepharitis OU with crusting esp at lateral canthus Bilateral lower lid laxity with mild ectropion OU  OS: Vescicles ? Of left lower lid with erythema and edema Conj: OD: Clear           OS: 2+ follicles  Cornea: Clear OU, No Fluorocein stain OU AC: Deep and formed OU Iris: Normal OU Lens: PCIOL OU  Dilated exam deferred for now--pt in isolation  Assess: The patient's lower lid ectropion and dry eye is likely the cause for some redness and irritation and it appears that he might indeed have superficial herpetic dermatitis on his left lower lid that is causing the edema and erythema of that lid.  The conjunctiva is thickened with follicular changes that might be more related to his chronic exposure than anything else.  There is NO involvement of the cornea ( and no staining of the cornea) that would indicate that there is herpetic keratitis.  Plan: Continue treatment with oral acyclovir for possible herpetic dermatitis of the left lower lid. Start Artificial Tears/gel QID OU for dry eye Clean Lids BID with Ocusoft Lid Scrubs or Systane Lid Scrubs(these are OTC) and are lid wipes that help with control of the blepharitis (alternatively can use a few drops of baby shampoo in a cup of warm water and use several q-tips soaked in this solution to clean the lids and lashes BID)  Start Alrex ophth drops BID OU to decrease the chronic inflammatory response that he is having from the chronic dry eye, but should be used for only 2 weeks--just until the inflammation is under control   He should be  followed some time in the next 2 weeks to make sure that the lid edema and vescicles are clearing and the dry eye is under control  Trish Fountain MD

## 2013-01-10 NOTE — Progress Notes (Signed)
Subjective: Breathing better  Objective: Vital signs in last 24 hours: Temp:  [97.3 F (36.3 C)-97.8 F (36.6 C)] 97.3 F (36.3 C) (08/13 0636) Pulse Rate:  [52-55] 55 (08/13 0636) Resp:  [18-19] 19 (08/13 0636) BP: (114-134)/(39-71) 114/50 mmHg (08/13 0636) SpO2:  [92 %-98 %] 92 % (08/13 0636) Weight:  [176 lb 5.9 oz (80 kg)] 176 lb 5.9 oz (80 kg) (08/13 0636) Last BM Date: 01/07/13  Intake/Output from previous day: 08/12 0701 - 08/13 0700 In: 480 [P.O.:480] Out: 1175 [Urine:1175] Intake/Output this shift: Total I/O In: 240 [P.O.:240] Out: -   Medications Current Facility-Administered Medications  Medication Dose Route Frequency Provider Last Rate Last Dose  . 0.9 %  sodium chloride infusion  250 mL Intravenous PRN Joseph Art, DO      . acetaminophen (TYLENOL) tablet 650 mg  650 mg Oral Q4H PRN Joseph Art, DO      . amLODipine (NORVASC) tablet 5 mg  5 mg Oral Daily Leroy Sea, MD   5 mg at 01/10/13 1026  . aspirin tablet 325 mg  325 mg Oral Daily Joseph Art, DO   325 mg at 01/10/13 1026  . ciprofloxacin (CILOXAN) 0.3 % ophthalmic solution 2 drop  2 drop Both Eyes Q2H while awake Joseph Art, DO   2 drop at 01/10/13 1026  . furosemide (LASIX) tablet 40 mg  40 mg Oral Daily Mihai Croitoru, MD   40 mg at 01/10/13 1026  . heparin injection 5,000 Units  5,000 Units Subcutaneous Q8H Joseph Art, DO   5,000 Units at 01/10/13 0603  . hydrALAZINE (APRESOLINE) injection 10 mg  10 mg Intravenous Q6H PRN Joseph Art, DO      . levETIRAcetam (KEPPRA) tablet 500 mg  500 mg Oral Q12H Jessica U Vann, DO   500 mg at 01/10/13 1026  . ondansetron (ZOFRAN) injection 4 mg  4 mg Intravenous Q6H PRN Joseph Art, DO      . simvastatin (ZOCOR) tablet 20 mg  20 mg Oral q1800 Joseph Art, DO   20 mg at 01/09/13 1726  . sodium chloride 0.9 % injection 3 mL  3 mL Intravenous Q12H Jessica U Vann, DO   3 mL at 01/10/13 1026  . sodium chloride 0.9 % injection 3 mL  3  mL Intravenous PRN Joseph Art, DO        PE: General appearance: alert, cooperative and no distress Left eye with erythema and clear drainage Neck: no JVD Lungs: clear to auscultation bilaterally Heart: regular rate and rhythm, S1, S2 normal, no murmur, click, rub or gallop Extremities: NO LEE Pulses: 2+ and symmetric Neurologic: Grossly normal Skin Dry.    Lab Results:  No results found for this basename: WBC, HGB, HCT, PLT,  in the last 72 hours BMET  Recent Labs  01/08/13 0520 01/09/13 0450 01/10/13 0535  NA 143 141 142  K 4.4 4.5 5.0  CL 105 101 100  CO2 28 27 34*  GLUCOSE 152* 132* 115*  BUN 25* 39* 35*  CREATININE 1.33 1.39* 1.32  CALCIUM 9.2 9.1 9.0    Assessment/Plan   Principal Problem:   SOB (shortness of breath) Active Problems:   Hypertension   Hard of hearing   HTN (hypertension)   Aortic valve disorders   Conjunctivitis, bacterial  Plan:  Changed to PO lasix yesterday.  Net fluids:  -0.7L/-3.1L.  BNP dropped from 3531 to 997.0.   SCr better.  BP and HR stable.  Normal EF with grade two diastolic dysfunction and moderate AS.  OK to DC from cardiology standpoint.  Would follow BMET in a few days.  He may tolerate a lower dose ot Lasix with PRN increase base on weight.  Follow up with Dr. Anne Fu at [facility].   LOS: 3 days    Donelle Baba 01/10/2013 11:10 AM

## 2013-01-10 NOTE — Progress Notes (Signed)
Pt. Seen and examined. Agree with the NP/PA-C note as written.  Appears clinically compensated with heart failure. BNP improved. SCr improved. Stable for d/c from our standpoint. Follow-up with Dr. Anne Fu at [facility].  Chrystie Nose, MD, [facility] Attending Cardiologist [facility]

## 2013-01-10 NOTE — Progress Notes (Signed)
Pt sustaining SVT in the 130's 140's. Pt is asymptomatic. Pt is lying in bed, sleeping. Pt did complain of feeling weak. Vital signs were stable. Pt not complaining of SOB or chest pain. Dr. Laural Benes paged. Dr. Laural Benes came to see patient and evaluate. PA Cardiologist paged. 12.5 mg of Lopressor PO ordered. Will continue to monitor

## 2013-01-11 LAB — BASIC METABOLIC PANEL
BUN: 36 mg/dL — ABNORMAL HIGH (ref 6–23)
CO2: 31 mEq/L (ref 19–32)
Calcium: 9.4 mg/dL (ref 8.4–10.5)
Creatinine, Ser: 1.21 mg/dL (ref 0.50–1.35)
Glucose, Bld: 128 mg/dL — ABNORMAL HIGH (ref 70–99)
Sodium: 141 mEq/L (ref 135–145)

## 2013-01-11 LAB — CBC
HCT: 43.5 % (ref 39.0–52.0)
Hemoglobin: 15.1 g/dL (ref 13.0–17.0)
MCH: 32.5 pg (ref 26.0–34.0)
MCHC: 34.7 g/dL (ref 30.0–36.0)
MCV: 93.8 fL (ref 78.0–100.0)
RBC: 4.64 MIL/uL (ref 4.22–5.81)

## 2013-01-11 MED ORDER — ACYCLOVIR 800 MG PO TABS: 800.0000 mg | ORAL_TABLET | Freq: Every day | ORAL | Status: DC

## 2013-01-11 MED ORDER — METOPROLOL TARTRATE 12.5 MG HALF TABLET: 12.5000 mg | ORAL_TABLET | Freq: Two times a day (BID) | ORAL | Status: DC

## 2013-01-11 MED ORDER — POTASSIUM CHLORIDE ER 10 MEQ PO TBCR: 10.0000 meq | EXTENDED_RELEASE_TABLET | Freq: Every day | ORAL | Status: DC

## 2013-01-11 MED ORDER — POLYVINYL ALCOHOL 1.4 % OP SOLN: 1.0000 [drp] | Freq: Four times a day (QID) | OPHTHALMIC | Status: DC

## 2013-01-11 MED ORDER — PREDNISOLONE ACETATE 1 % OP SUSP: 1.0000 [drp] | Freq: Four times a day (QID) | OPHTHALMIC | Status: DC

## 2013-01-11 MED ORDER — AMLODIPINE BESYLATE 5 MG PO TABS: 5.0000 mg | ORAL_TABLET | Freq: Every day | ORAL | Status: DC

## 2013-01-11 MED ORDER — FUROSEMIDE 40 MG PO TABS: 40.0000 mg | ORAL_TABLET | Freq: Every day | ORAL | Status: DC

## 2013-01-11 NOTE — Progress Notes (Signed)
Patient's IV was d/c.  Telemetry was d/c.  Discharge instructions reviewed with patient and daughter, and wife.  Patient is very hard of hearing.  Patient and family verbalized understanding of discharge information.  Patient and family informed that patient will be being discharged home with home oxygen at 2 lpm [place] continuously.  Eye drops given to patient to take home with him to use.

## 2013-01-11 NOTE — Progress Notes (Signed)
Patient was assessed for needing home oxygen.  Patient was monitored at rest on room air and was 94%.  Walked on room air and O2 dropped to 84%.  Reapplied oxygen at 2 lpm [place] and O2 was 94%.  At rest after walking o2 94 % on room air.  Patient never became short of breath during walking. MD made aware.  This is in preparation of discharge today home.

## 2013-01-11 NOTE — Progress Notes (Signed)
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 He returns now with difficulty breathing and reports dizziness when he was getting up at home.    PT Comments   Cont to recommend HHPT & 24 hr assist.   Follow Up Recommendations  Home health PT;Supervision/Assistance - 24 hour     Does the patient have the potential to tolerate intense rehabilitation     Barriers to Discharge        Equipment Recommendations  Other (comment) (rollator)    Recommendations for Other Services    Frequency Min 3X/week   Progress towards PT Goals Progress towards PT goals: Progressing toward goals  Plan Current plan remains appropriate    Precautions / Restrictions Precautions Precautions: Fall Restrictions Weight Bearing Restrictions: No   Pertinent Vitals/Pain Sp02 84% RA at rest upon arrival.  Ambulated with 2L 02 & 02 sats 94% after returning back to room.      Mobility  Bed Mobility Bed Mobility: Supine to Sit;Sitting - Scoot to Edge of Bed Supine to Sit: 6: Modified independent (Device/Increase time) Sitting - Scoot to Edge of Bed: 6: Modified independent (Device/Increase time) Transfers Transfers: Sit to Stand;Stand to Sit Sit to Stand: 4: Min guard;With upper extremity assist;From bed;With armrests;From chair/3-in-1 Stand to Sit: 4: Min guard;With upper extremity assist;With armrests;To bed;To chair/3-in-1 Details for Transfer Assistance: cues to reinforce hand placement.  Increased time to achieve standing.  Ambulation/Gait Ambulation/Gait Assistance: 4: Min guard Ambulation Distance (Feet): 250 Feet Assistive device: Rolling walker Ambulation/Gait Assistance Details: cues for safe use of RW & avoidance of obstacles Gait  Pattern: Step-through pattern;Decreased stride length;Wide base of support Gait velocity: decreased Stairs: No Wheelchair Mobility Wheelchair Mobility: No         PT Goals (current goals can now be found in the care plan section) Acute Rehab PT Goals PT Goal Formulation: With patient Time For Goal Achievement: 01/22/13 Potential to Achieve Goals: Good  Visit Information  Last PT Received On: 01/11/13 Assistance Needed: +1 History of Present Illness: Pt presented end of July with 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 He returns now with difficulty breathing and reports dizziness when he was getting up at home.     Subjective Data      Cognition  Cognition Arousal/Alertness: Awake/alert Behavior During Therapy: WFL for tasks assessed/performed Overall Cognitive Status: History of cognitive impairments - at baseline    Balance     End of Session PT - End of Session Equipment Utilized During Treatment: Gait belt;Oxygen (2L) Activity Tolerance: Patient tolerated treatment well Patient left: in chair;with call bell/phone within reach Nurse Communication: Mobility status     Verdell Face, [place] [phone] 01/11/2013

## 2013-01-11 NOTE — Discharge Summary (Signed)
[facility]                                                                                   Larry Bradford, is a 79 y.o. male  DOB 1916/12/17  MRN [id number].  Admission date:  01/07/2013  Discharge Date:  01/11/2013  Primary MD  Georgianne Fick, MD  Admitting Physician  Joseph Art, DO  Admission Diagnosis  CHF, acute [428.0]  Discharge Diagnosis     Principal Problem:   SOB (shortness of breath) Active Problems:   Hypertension   Hard of hearing   HTN (hypertension)   Aortic valve disorders   Conjunctivitis, bacterial    Past Medical History  Diagnosis Date  . Seizures     Onset in Sept 2006  . Cataract   . Hard of hearing   . Renal disorder   . Depression   . HTN (hypertension)   . BPH (benign prostatic hyperplasia)   . CAD (coronary artery disease)     s/p CABG in 2002  . Hyperlipidemia   . OSA (obstructive sleep apnea)   . Aortic aneurysm     3 CM in 2007  . Carotid stenosis   . DDD (degenerative disc disease)   . Trigeminal neuralgia   . Lower GI bleed   . Stroke     Past Surgical History  Procedure Laterality Date  . Coronary artery bypass graft  2002  . Temporal artery biopsy / ligation  2004     Recommendations for primary care physician for things to follow:   Follow weight, BMP and diuretic dose closely. Must follow with ophthalmology on a close basis.   Discharge Diagnoses:   Principal Problem:   SOB (shortness of breath) Active Problems:   Hypertension   Hard of hearing   HTN (hypertension)   Aortic valve disorders   Conjunctivitis, bacterial    Discharge Condition: stable   Diet recommendation: See Discharge Instructions below   Consults Cards,Opthalmology    History of present illness and  Hospital Course:     Kindly see H&P for history of present illness and admission details, please review complete Labs, Consult reports and Test reports for all details in brief Larry Bradford, is a 79 y.o. male,  patient with history of hypertension, diastolic CHF, bilateral hearing loss, aortic valve calcification who recently had a CVA few weeks ago was then admitted to the hospital with some left eye itching and shortness of breath. He was diagnosed with acute on chronic diastolic CHF along with vesicular left eyelid rash consistent with shingles in the left eyelid, he was seen by cardiology and ophthalmology. He was gently diuresed and eyedrops along with oral acyclovir will prescribe per ophthalmology. He never had any vision problems or eyeball pain.   He is now clinically close to compensated from CHF standpoint and cardiology has cleared him for home discharge, he will be given 7 more days of Lasix, he will be given instructions on diet and salt control along with water restriction. Will request primary care physician to please monitor BMP, weight and diuretic dose closely. Please make sure patient follows with cardiology and ophthalmology on a close  basis post discharge. He is still wearing 2 L nasal cannula oxygen, I will titrate him off of oxygen, if he still requiring oxygen home oxygen will be prescribed upon discharge. Note recent echo gram from August of 2014 shows grade 2 diastolic dysfunction and a preserved EF of 60%.   He did have a small run of nonsustained supraventricular tachycardia on eighth 13 2014 after which he was placed on low-dose beta blocker per cardiology which will be continued upon discharge   He will be provided with steroid eyedrops along with artificial tears per ophthalmology recommendation, I have requested him to follow with ophthalmology within a week, have also given him instructions that if he develops blurry vision in his left eye or eyeball pain that he should go to the ER immediately.   For his hypertension and dyslipidemia. He will continue his home dose statin, blood pressure medications were adjusted as recommended by cardiology.      Today   Subjective:    Jams Trickett today has no headache,no chest abdominal pain,no new weakness tingling or numbness, feels much better wants to go home today.    Objective:   Blood pressure 132/66, pulse 68, temperature 98 F (36.7 C), temperature source Oral, resp. rate 18, height 5\' 6"  (1.676 m), weight 80.377 kg (177 lb 3.2 oz), SpO2 96.00%.   Intake/Output Summary (Last 24 hours) at 01/11/13 1153 Last data filed at 01/11/13 1119  Gross per 24 hour  Intake    603 ml  Output    550 ml  Net     53 ml    Exam Awake Alert, Oriented *3, No new F.N deficits, Normal affect [place].AT,PERRAL Supple Neck,No JVD, No cervical lymphadenopathy appriciated.  Symmetrical Chest wall movement, Good air movement bilaterally, CTAB RRR,No Gallops,Rubs or new Murmurs, No Parasternal Heave +ve B.Sounds, Abd Soft, Non tender, No organomegaly appriciated, No rebound -guarding or rigidity. No Cyanosis, Clubbing or edema, No new Rash or bruise  Data Review   Major procedures and Radiology Reports - PLEASE review detailed and final reports for all details, in brief -     Dg Chest 2 View  01/07/2013   *RADIOLOGY REPORT*  Clinical Data: Short of breath  CHEST - 2 VIEW  Comparison: 12/20/2012  Findings: Prior CABG.  Cardiac enlargement.  Negative for heart failure.  Negative for infiltrate or effusion.  IMPRESSION: Cardiac enlargement.  No acute abnormality.   Original Report Authenticated By: Janeece Riggers, M.D.       Micro Results      No results found for this or any previous visit (from the past 240 hour(s)).   CBC w Diff: Lab Results  Component Value Date   WBC 9.0 01/11/2013   HGB 15.1 01/11/2013   HCT 43.5 01/11/2013   PLT 191 01/11/2013   LYMPHOPCT 17 01/07/2013   MONOPCT 6 01/07/2013   EOSPCT 3 01/07/2013   BASOPCT 0 01/07/2013    CMP: Lab Results  Component Value Date   NA 141 01/11/2013   K 3.7 01/11/2013   CL 100 01/11/2013   CO2 31 01/11/2013   BUN 36* 01/11/2013   CREATININE 1.21 01/11/2013   PROT 7.3  01/07/2013   ALBUMIN 3.5 01/07/2013   BILITOT 0.6 01/07/2013   ALKPHOS 58 01/07/2013   AST 16 01/07/2013   ALT 22 01/07/2013  .   Discharge Instructions     Follow with Primary MD Georgianne Fick, MD in 4 days , if you develop eye pain, blurry vision report to  the nearest ER immediately.  Get CBC, CMP, checked 4 days by Primary MD and again as instructed by your Primary MD. Get a 2 view Chest X ray done next visit .  Get Medicines reviewed and adjusted.  Please request your Prim.MD to go over all Hospital Tests and Procedure/Radiological results at the follow up, please get all Hospital records sent to your Prim MD by signing hospital release before you go home.  Activity: As tolerated with Full fall precautions use walker/cane & assistance as needed   Diet:  Heart Healthy,  Fluid restriction 1.8 lit/day, Aspiration precautions.  For Heart failure patients - Check your Weight same time everyday, if you gain over 2 pounds, or you develop in leg swelling, experience more shortness of breath or chest pain, call your Primary MD immediately. Follow Cardiac Low Salt Diet and 1.8 lit/day fluid restriction.  Disposition Home    If you experience worsening of your admission symptoms, develop shortness of breath, life threatening emergency, suicidal or homicidal thoughts you must seek medical attention immediately by calling 911 or calling your MD immediately  if symptoms less severe.  You Must read complete instructions/literature along with all the possible adverse reactions/side effects for all the Medicines you take and that have been prescribed to you. Take any new Medicines after you have completely understood and accpet all the possible adverse reactions/side effects.   Do not drive and provide baby sitting services if your were admitted for syncope or siezures until you have seen by Primary MD or a Neurologist and advised to do so again.  Do not drive when taking Pain medications.     Do not take more than prescribed Pain, Sleep and Anxiety Medications  Special Instructions: If you have smoked or chewed Tobacco  in the last 2 yrs please stop smoking, stop any regular Alcohol  and or any Recreational drug use.  Wear Seat belts while driving.   Please note  You were cared for by a hospitalist during your hospital stay. If you have any questions about your discharge medications or the care you received while you were in the hospital after you are discharged, you can call the unit and asked to speak with the hospitalist on call if the hospitalist that took care of you is not available. Once you are discharged, your primary care physician will handle any further medical issues. Please note that NO REFILLS for any discharge medications will be authorized once you are discharged, as it is imperative that you return to your primary care physician (or establish a relationship with a primary care physician if you do not have one) for your aftercare needs so that they can reassess your need for medications and monitor your lab values.    Follow-up Information   Follow up with [facility], MD. Schedule an appointment as soon as possible for a visit in 4 days.   Specialty:  Internal Medicine   Contact information:   [address] [place] [address] [phone]       Follow up with Chrystie Nose, MD. Schedule an appointment as soon as possible for a visit in 1 week.   Specialty:  Cardiology   Contact information:   [address] [place] [address] [phone]       Follow up with Trish Fountain, MD. Schedule an appointment as soon as possible for a visit in 1 week.   Specialty:  Ophthalmology   Contact information:   [address]  [place]er [address] [place] [address] [phone]         Discharge Medications     Medication List    STOP taking these medications       metoprolol succinate 100  MG 24 hr tablet  Commonly known as:  TOPROL-XL      TAKE these medications       acyclovir 800 MG tablet  Commonly known as:  ZOVIRAX  Take 1 tablet (800 mg total) by mouth 5 (five) times daily.     amLODipine 5 MG tablet  Commonly known as:  NORVASC  Take 1 tablet (5 mg total) by mouth daily.     aspirin 325 MG tablet  Take 1 tablet (325 mg total) by mouth daily.     furosemide 40 MG tablet  Commonly known as:  LASIX  Take 1 tablet (40 mg total) by mouth daily.     levETIRAcetam 500 MG tablet  Commonly known as:  KEPPRA  Take 500 mg by mouth every 12 (twelve) hours.     metoprolol tartrate 12.5 mg Tabs tablet  Commonly known as:  LOPRESSOR  Take 0.5 tablets (12.5 mg total) by mouth 2 (two) times daily.     polyvinyl alcohol 1.4 % ophthalmic solution  Commonly known as:  LIQUIFILM TEARS  Place 1 drop into both eyes 4 (four) times daily.     potassium chloride 10 MEQ tablet  Commonly known as:  K-DUR  Take 1 tablet (10 mEq total) by mouth daily.     pravastatin 40 MG tablet  Commonly known as:  PRAVACHOL  Take 40 mg by mouth daily.     prednisoLONE acetate 1 % ophthalmic suspension  Commonly known as:  PRED FORTE  Place 1 drop into both eyes 4 (four) times daily.           Total Time in preparing paper work, data evaluation and todays exam - 35 minutes  Leroy Sea M.D on 01/11/2013 at 11:53 AM  [facility] Office  [phone]

## 2013-01-18 ENCOUNTER — Encounter: Payer: Self-pay | Admitting: Internal Medicine

## 2013-01-18 ENCOUNTER — Ambulatory Visit (INDEPENDENT_AMBULATORY_CARE_PROVIDER_SITE_OTHER): Payer: Medicare Other | Admitting: Internal Medicine

## 2013-01-18 VITALS — BP 130/70 | HR 60 | Ht 67.0 in | Wt 182.2 lb

## 2013-01-18 DIAGNOSIS — I359 Nonrheumatic aortic valve disorder, unspecified: Secondary | ICD-10-CM

## 2013-01-18 DIAGNOSIS — R0602 Shortness of breath: Secondary | ICD-10-CM

## 2013-01-18 DIAGNOSIS — Z79899 Other long term (current) drug therapy: Secondary | ICD-10-CM

## 2013-01-18 DIAGNOSIS — R0989 Other specified symptoms and signs involving the circulatory and respiratory systems: Secondary | ICD-10-CM

## 2013-01-18 DIAGNOSIS — I2510 Atherosclerotic heart disease of native coronary artery without angina pectoris: Secondary | ICD-10-CM

## 2013-01-18 DIAGNOSIS — I10 Essential (primary) hypertension: Secondary | ICD-10-CM

## 2013-01-18 DIAGNOSIS — N289 Disorder of kidney and ureter, unspecified: Secondary | ICD-10-CM

## 2013-01-18 DIAGNOSIS — R0609 Other forms of dyspnea: Secondary | ICD-10-CM

## 2013-01-18 DIAGNOSIS — R0600 Dyspnea, unspecified: Secondary | ICD-10-CM

## 2013-01-18 DIAGNOSIS — E785 Hyperlipidemia, unspecified: Secondary | ICD-10-CM

## 2013-01-18 LAB — BASIC METABOLIC PANEL
BUN: 25 mg/dL — ABNORMAL HIGH (ref 6–23)
Creat: 1.33 mg/dL (ref 0.50–1.35)
Glucose, Bld: 111 mg/dL — ABNORMAL HIGH (ref 70–99)
Potassium: 4.6 mEq/L (ref 3.5–5.3)

## 2013-01-18 NOTE — Patient Instructions (Addendum)
Your physician wants you to follow-up in: 3 months.  You will receive a reminder letter in the mail two months in advance. If you don't receive a letter, please call our office to schedule the follow-up appointment.  Your physician recommends that you return for lab work TODAY. BNP, BMP We will call you with the results.

## 2013-01-18 NOTE — Progress Notes (Signed)
OFFICE NOTE  Chief Complaint:  Hospital followup  Primary Care Physician: Georgianne Fick, MD  HPI:  Larry Bradford is a 79 y/o male, previously followed by Dr. Jenne Campus, who was admitted by [facility] for evaluation for SOB. The patient has not been seen in our office since 2007. He has documented CAD and underwent CABG x 3 in 2002, receiving a vein graft to the circumflex OM, vein graft to the right coronary artery and an internal mammary artery graft to the mid LAD. He apparently suffered a CVA in July, leading to a 2D echo which revealed normal systolic function with an EF of 60-65%, mild-moderate aortic stenosis and Grade 2 diastolic dysfunction. On arrival to the ED for evaluation of SOB, his BNP was elevated at 3,467. CXR demonstrated cardiomegaly but no significant pulmonary edema. He was started on 40 mg IV lasix BID. He clinically has improvement in his swelling and his weight was down, however today his weight is up somewhat. This is despite the fact that he is not eating as much. Denies any chest pain whatsoever. He has been taking his Lasix daily. He is zoster ophthalmicus is improving.  PMHx:  Past Medical History  Diagnosis Date  . Seizures     Onset in Sept 2006  . Cataract   . Hard of hearing   . Renal disorder   . Depression   . HTN (hypertension)   . BPH (benign prostatic hyperplasia)   . CAD (coronary artery disease)     s/p CABG in 2002  . Hyperlipidemia   . OSA (obstructive sleep apnea)   . Aortic aneurysm     3 CM in 2007  . Carotid stenosis   . DDD (degenerative disc disease)   . Trigeminal neuralgia   . Lower GI bleed   . Stroke     Past Surgical History  Procedure Laterality Date  . Coronary artery bypass graft  2002  . Temporal artery biopsy / ligation  2004    FAMHx:  Family History  Problem Relation Age of Onset  . CAD Brother     SOCHx:   reports that he has never smoked. He has never used smokeless tobacco. He reports that he does not  drink alcohol. His drug history is not on file.  ALLERGIES:  No Known Allergies  ROS: A comprehensive review of systems was negative except for: Respiratory: positive for dyspnea on exertion and on oxygen  HOME MEDS: Current Outpatient Prescriptions  Medication Sig Dispense Refill  . amLODipine (NORVASC) 5 MG tablet Take 1 tablet (5 mg total) by mouth daily.  30 tablet  0  . aspirin 325 MG tablet Take 1 tablet (325 mg total) by mouth daily.      . furosemide (LASIX) 40 MG tablet Take 1 tablet (40 mg total) by mouth daily.  10 tablet  0  . levETIRAcetam (KEPPRA) 500 MG tablet Take 500 mg by mouth every 12 (twelve) hours.      . meclizine (ANTIVERT) 25 MG tablet Take 25 mg by mouth as needed.      . metoprolol tartrate (LOPRESSOR) 12.5 mg TABS tablet Take 0.5 tablets (12.5 mg total) by mouth 2 (two) times daily.  60 tablet  0  . OXYGEN-HELIUM IN Inhale 2 L into the lungs continuous.      . polyvinyl alcohol (LIQUIFILM TEARS) 1.4 % ophthalmic solution Place 1 drop into both eyes 4 (four) times daily.  15 mL  0  . potassium chloride (K-DUR) 10 MEQ  tablet Take 1 tablet (10 mEq total) by mouth daily.  10 tablet  0  . pravastatin (PRAVACHOL) 40 MG tablet Take 40 mg by mouth daily.      . prednisoLONE acetate (PRED FORTE) 1 % ophthalmic suspension Place 1 drop into both eyes 4 (four) times daily.  5 mL  0  . triamcinolone cream (KENALOG) 0.5 % 2 (two) times daily.       No current facility-administered medications for this visit.    LABS/IMAGING: No results found for this or any previous visit (from the past 48 hour(s)). No results found.  VITALS: BP 130/70  Pulse 60  Ht 5\' 7"  (1.702 m)  Wt 182 lb 3.2 oz (82.645 kg)  BMI 28.53 kg/m2  EXAM: General appearance: alert and no distress Neck: no adenopathy, no carotid bruit, no JVD, supple, symmetrical, trachea midline and thyroid not enlarged, symmetric, no tenderness/mass/nodules Lungs: clear to auscultation bilaterally Heart: regular  rate and rhythm, S1, S2 normal and systolic murmur: midsystolic 3/6, crescendo at 2nd right intercostal space Abdomen: soft, non-tender; bowel sounds normal; no masses,  no organomegaly Extremities: extremities normal, atraumatic, no cyanosis or edema, venous stasis dermatitis noted and dry skin Pulses: 2+ and symmetric Skin: Skin color, texture, turgor normal. No rashes or lesions Neurologic: Grossly normal  EKG: Sinus rhythm at 60 with a right bundle branch  ASSESSMENT: 1. Coronary artery disease status post 3 vessel CABG in 2002 2. Diastolic heart failure 3. Persistent shortness of breath and oxygen requirement  PLAN: 1.   Mr. Vuncannon and he is still short of breath and requiring continuous oxygen. His weight is gone up several pounds since discharge and is not eating as much. Concern that he may need more diuretic, I would like to recheck laboratory work today including a BMP and BNP. If elevated I would recommend increasing his diuretic possibly the 40 mg Lasix twice daily. We'll plan to see him back in a month to see how he is progressing.  Chrystie Nose, MD, [facility] Attending Cardiologist [facility]  Emberlyn Burlison C 01/18/2013, 12:37 PM

## 2013-01-19 LAB — BRAIN NATRIURETIC PEPTIDE: Brain Natriuretic Peptide: 62.2 pg/mL (ref 0.0–100.0)

## 2013-01-22 ENCOUNTER — Other Ambulatory Visit: Payer: Self-pay | Admitting: Internal Medicine

## 2013-01-22 ENCOUNTER — Telehealth: Payer: Self-pay | Admitting: Internal Medicine

## 2013-01-22 NOTE — Telephone Encounter (Signed)
Returned call and spoke w/ Aram Beecham, pt's caregiver.  Results given per result note by Dr. Rennis Golden.  Aram Beecham stated she wants Dr. Rennis Golden to explain more as to why he says pt is not in heart failure and all of the other doctors says that he is.  Informed Aram Beecham that the test showed no signs of failure presently.  Aram Beecham would like Dr. Rennis Golden or his nurse to call her back with more information explaining this.  Informed they will be notified.  Message forwarded to Dr. Dorene Grebe, RN.

## 2013-01-22 NOTE — Progress Notes (Signed)
Spoke with patient, who is HOH. Patient stated wife would call back to receive results.

## 2013-01-22 NOTE — Telephone Encounter (Signed)
Spoke with his daughter Aram Beecham today to explain the lab results. She understands.  -Dr. Rennis Golden

## 2013-01-22 NOTE — Telephone Encounter (Signed)
Waiting for lab results from last Thursday-need to know how to take his Lasix.

## 2013-01-23 ENCOUNTER — Encounter: Payer: Self-pay | Admitting: Internal Medicine

## 2013-01-23 ENCOUNTER — Other Ambulatory Visit: Payer: Self-pay | Admitting: Internal Medicine

## 2013-01-23 NOTE — Telephone Encounter (Signed)
Medication refill

## 2013-02-12 ENCOUNTER — Other Ambulatory Visit: Payer: Self-pay | Admitting: Internal Medicine

## 2013-03-01 ENCOUNTER — Ambulatory Visit: Payer: Medicare Other | Admitting: Pharmacist Clinician (PhC)/ Clinical Pharmacy Specialist

## 2013-04-18 ENCOUNTER — Encounter: Payer: Self-pay | Admitting: Cardiology

## 2013-04-19 ENCOUNTER — Encounter: Payer: Self-pay | Admitting: Cardiology

## 2013-04-19 ENCOUNTER — Ambulatory Visit (INDEPENDENT_AMBULATORY_CARE_PROVIDER_SITE_OTHER): Payer: Medicare Other | Admitting: Cardiology

## 2013-04-19 ENCOUNTER — Encounter (INDEPENDENT_AMBULATORY_CARE_PROVIDER_SITE_OTHER): Payer: Self-pay

## 2013-04-19 VITALS — BP 124/78 | HR 62 | Ht 67.0 in | Wt 183.8 lb

## 2013-04-19 DIAGNOSIS — I10 Essential (primary) hypertension: Secondary | ICD-10-CM

## 2013-04-19 DIAGNOSIS — I2510 Atherosclerotic heart disease of native coronary artery without angina pectoris: Secondary | ICD-10-CM

## 2013-04-19 DIAGNOSIS — R5381 Other malaise: Secondary | ICD-10-CM

## 2013-04-19 DIAGNOSIS — Z8673 Personal history of transient ischemic attack (TIA), and cerebral infarction without residual deficits: Secondary | ICD-10-CM

## 2013-04-19 DIAGNOSIS — R531 Weakness: Secondary | ICD-10-CM

## 2013-04-19 NOTE — Patient Instructions (Signed)
Your physician recommends that you continue on your current medications as directed. Please refer to the Current Medication list given to you today.  Your physician wants you to follow-up in: 6 months with Dr. Skains. You will receive a reminder letter in the mail two months in advance. If you don't receive a letter, please call our office to schedule the follow-up appointment.  

## 2013-04-19 NOTE — Progress Notes (Signed)
[address] [place], [place]  [address] Phone: [phone] Fax:  [phone]  Date:  04/19/2013   ID:  Larry Bradford, DOB 1916-11-14, MRN [id number]  PCP:  Georgianne Fick, MD   History of Present Illness: Larry Bradford is a 79 y.o. male with a history of a 3 cm AAA, obstructive sleep apnea, brainstem TIA in 2007, multivessel coronary disease with bypass in March of 2002, nuclear stress test in 2007, low risk, EF of 74%,and history of chronic noncompliance.. Previous 24-hour Holter monitor showed very frequent PACs, frequent paroxysmal atrial tachycardia which was quite lengthy at times with a heart rate in the 110 range.   His echocardiogram also showed mild aortic stenosis with diastolic dysfunction. At last office visit with Dr Anne Fu, 01/29/11, metoprolol succinate 25 mg once a day was restarted. Prior atrial flutter with RVR, he was started on Plavix and his metoprolol was increased to 50 mg po qd. He is accompanied by his wife and daughter. He states he is not having chest pain, palpitations, nor syncope. no PND. He has chronic SOB that is unchanged, and has noted increase fatigue. He has not fallen but has a hx of seizures and none in the last 4 months, he has hx of medication noncompliancy but has been taking his seizure medication correctly. no swelling  Prior hospital records reviewed.  Wt Readings from Last 3 Encounters:  04/19/13 183 lb 12.8 oz (83.371 kg)  01/18/13 182 lb 3.2 oz (82.645 kg)  01/11/13 177 lb 3.2 oz (80.377 kg)     Past Medical History  Diagnosis Date  . Seizures     Onset in Sept 2006  . Cataract   . Hard of hearing   . Renal disorder   . Depression   . HTN (hypertension)   . BPH (benign prostatic hyperplasia)   . CAD (coronary artery disease)     s/p CABG in 2002  . Hyperlipidemia   . OSA (obstructive sleep apnea)   . Aortic aneurysm     3 CM in 2007  . Carotid stenosis   . DDD (degenerative disc disease)   .  Trigeminal neuralgia   . Lower GI bleed   . Stroke     Past Surgical History  Procedure Laterality Date  . Coronary artery bypass graft  2002  . Temporal artery biopsy / ligation  2004    Current Outpatient Prescriptions  Medication Sig Dispense Refill  . levETIRAcetam (KEPPRA) 500 MG tablet Take 500 mg by mouth every 12 (twelve) hours.      . polyvinyl alcohol (LIQUIFILM TEARS) 1.4 % ophthalmic solution Place 1 drop into both eyes 4 (four) times daily.  15 mL  0   No current facility-administered medications for this visit.    Allergies:   No Known Allergies  Social History:  The patient  reports that he has never smoked. He has never used smokeless tobacco. He reports that he does not drink alcohol or use illicit drugs.   ROS:  Please see the history of present illness.   Feels weakness, tired, no significant shortness of breath, no chest pain. Eyes are dry. No syncope.    PHYSICAL EXAM: VS:  BP 124/78  Pulse 62  Ht 5\' 7"  (1.702 m)  Wt 183 lb 12.8 oz (83.371 kg)  BMI 28.78 kg/m2 Well nourished, well developed, elderly in no acute distress HEENT: normalDry eye Neck: no JVD Cardiac:  normal S1, S2; RRR; 2/6  systolic murmurRight upper sternal border Lungs:  clear to auscultation bilaterally, no wheezing, rhonchi or rales Abd: soft, nontender, no hepatomegaly obese Ext: no edema Skin: warm and dry Neuro: no focal abnormalities noted  EKG:  None today     ASSESSMENT AND PLAN:  1. Coronary artery disease-bypass 3 vessel 2002-I would like for him to take a low-dose aspirin 81 mg a day. No prior signs of bleeding. 2. Chronic diastolic heart failure-currently well compensated. Weight has been stable. No complaints of shortness of breath. Lungs sound clear. He is not currently on diuretic. I'm fine with this as long as his weights are stable. 3. Chronic fatigue/weakness-encourage physical therapy, exercise, be very careful with gait. 4. Mild aortic stenosis-of note hemodynamic  consequence.  Signed, [name], MD [facility]  04/19/2013 2:56 PM

## 2013-04-20 ENCOUNTER — Ambulatory Visit: Payer: Medicare Other | Admitting: Internal Medicine

## 2013-12-28 ENCOUNTER — Emergency Department (HOSPITAL_BASED_OUTPATIENT_CLINIC_OR_DEPARTMENT_OTHER)
Admission: EM | Admit: 2013-12-28 | Discharge: 2013-12-28 | Disposition: A | Discharge status: Home / Self Care | Payer: Medicare Other | Attending: Emergency Medicine | Admitting: Emergency Medicine

## 2013-12-28 ENCOUNTER — Encounter (HOSPITAL_BASED_OUTPATIENT_CLINIC_OR_DEPARTMENT_OTHER): Payer: Self-pay | Admitting: Emergency Medicine

## 2013-12-28 DIAGNOSIS — Z79899 Other long term (current) drug therapy: Secondary | ICD-10-CM | POA: Insufficient documentation

## 2013-12-28 DIAGNOSIS — F411 Generalized anxiety disorder: Secondary | ICD-10-CM | POA: Diagnosis not present

## 2013-12-28 DIAGNOSIS — F419 Anxiety disorder, unspecified: Secondary | ICD-10-CM

## 2013-12-28 DIAGNOSIS — R5381 Other malaise: Secondary | ICD-10-CM | POA: Diagnosis not present

## 2013-12-28 DIAGNOSIS — IMO0002 Reserved for concepts with insufficient information to code with codable children: Secondary | ICD-10-CM | POA: Diagnosis not present

## 2013-12-28 DIAGNOSIS — Z8639 Personal history of other endocrine, nutritional and metabolic disease: Secondary | ICD-10-CM | POA: Insufficient documentation

## 2013-12-28 DIAGNOSIS — G40909 Epilepsy, unspecified, not intractable, without status epilepticus: Secondary | ICD-10-CM | POA: Diagnosis not present

## 2013-12-28 DIAGNOSIS — Z8719 Personal history of other diseases of the digestive system: Secondary | ICD-10-CM | POA: Insufficient documentation

## 2013-12-28 DIAGNOSIS — Z8739 Personal history of other diseases of the musculoskeletal system and connective tissue: Secondary | ICD-10-CM | POA: Insufficient documentation

## 2013-12-28 DIAGNOSIS — I2510 Atherosclerotic heart disease of native coronary artery without angina pectoris: Secondary | ICD-10-CM | POA: Diagnosis not present

## 2013-12-28 DIAGNOSIS — Z862 Personal history of diseases of the blood and blood-forming organs and certain disorders involving the immune mechanism: Secondary | ICD-10-CM | POA: Insufficient documentation

## 2013-12-28 DIAGNOSIS — Z951 Presence of aortocoronary bypass graft: Secondary | ICD-10-CM | POA: Diagnosis not present

## 2013-12-28 DIAGNOSIS — R21 Rash and other nonspecific skin eruption: Secondary | ICD-10-CM | POA: Insufficient documentation

## 2013-12-28 DIAGNOSIS — Z8669 Personal history of other diseases of the nervous system and sense organs: Secondary | ICD-10-CM | POA: Diagnosis not present

## 2013-12-28 DIAGNOSIS — Z8673 Personal history of transient ischemic attack (TIA), and cerebral infarction without residual deficits: Secondary | ICD-10-CM | POA: Diagnosis not present

## 2013-12-28 DIAGNOSIS — I10 Essential (primary) hypertension: Secondary | ICD-10-CM | POA: Insufficient documentation

## 2013-12-28 DIAGNOSIS — R5383 Other fatigue: Secondary | ICD-10-CM

## 2013-12-28 LAB — URINALYSIS, ROUTINE W REFLEX MICROSCOPIC
Bilirubin Urine: NEGATIVE
Glucose, UA: 100 mg/dL — AB
Hgb urine dipstick: NEGATIVE
Ketones, ur: NEGATIVE mg/dL
LEUKOCYTES UA: NEGATIVE
Nitrite: NEGATIVE
PH: 5.5 (ref 5.0–8.0)
Protein, ur: NEGATIVE mg/dL
SPECIFIC GRAVITY, URINE: 1.025 (ref 1.005–1.030)
UROBILINOGEN UA: 0.2 mg/dL (ref 0.0–1.0)

## 2013-12-28 LAB — CBC
HEMATOCRIT: 42.6 % (ref 39.0–52.0)
Hemoglobin: 14.2 g/dL (ref 13.0–17.0)
MCH: 31.2 pg (ref 26.0–34.0)
MCHC: 33.3 g/dL (ref 30.0–36.0)
MCV: 93.6 fL (ref 78.0–100.0)
Platelets: 207 10*3/uL (ref 150–400)
RBC: 4.55 MIL/uL (ref 4.22–5.81)
RDW: 13.5 % (ref 11.5–15.5)
WBC: 7.7 10*3/uL (ref 4.0–10.5)

## 2013-12-28 LAB — BASIC METABOLIC PANEL
Anion gap: 9 (ref 5–15)
BUN: 14 mg/dL (ref 6–23)
CALCIUM: 9.6 mg/dL (ref 8.4–10.5)
CHLORIDE: 103 meq/L (ref 96–112)
CO2: 32 meq/L (ref 19–32)
Creatinine, Ser: 1.4 mg/dL — ABNORMAL HIGH (ref 0.50–1.35)
GFR calc Af Amer: 48 mL/min — ABNORMAL LOW (ref >90)
GFR calc non Af Amer: 41 mL/min — ABNORMAL LOW (ref >90)
GLUCOSE: 149 mg/dL — AB (ref 70–99)
Potassium: 3.8 mEq/L (ref 3.7–5.3)
Sodium: 144 mEq/L (ref 137–147)

## 2013-12-28 LAB — HEPATIC FUNCTION PANEL
ALBUMIN: 3.3 g/dL — AB (ref 3.5–5.2)
ALT: 18 U/L (ref 0–53)
AST: 17 U/L (ref 0–37)
Alkaline Phosphatase: 66 U/L (ref 39–117)
Bilirubin, Direct: <0.2 mg/dL (ref 0.0–0.3)
Total Bilirubin: 0.3 mg/dL (ref 0.3–1.2)
Total Protein: 7.3 g/dL (ref 6.0–8.3)

## 2013-12-28 LAB — TROPONIN I: Troponin I: <0.3 ng/mL (ref <0.30)

## 2013-12-28 MED ORDER — LORAZEPAM 1 MG PO TABS: 1.0000 mg | ORAL_TABLET | Freq: Three times a day (TID) | ORAL | Status: DC | PRN

## 2013-12-28 MED ORDER — TRIAMCINOLONE ACETONIDE 0.1 % EX CREA: 1.0000 "application " | TOPICAL_CREAM | Freq: Two times a day (BID) | CUTANEOUS | Status: DC

## 2013-12-28 NOTE — ED Notes (Signed)
Pt reports right hand rash that began today  - redness noted to rt anterior hand pt admits to history of same rash "for years" to lower extremities however PCP unsure of what the rash is. Pt admits to some burning. Pt admits to some generalized weakness progressively worse "for days."

## 2013-12-28 NOTE — ED Provider Notes (Signed)
CSN: [id number]     Arrival date & time 12/28/13  7106 History  This chart was scribed for No att. providers found by [name], ED Scribe. This patient was seen in room MHOTF/OTF and the patient's care was started at 8:32 PM.     Chief Complaint  Patient presents with  . Rash   Patient is a 80 y.o. male presenting with rash. The history is provided by the patient and a relative. No language interpreter was used.  Rash Associated symptoms: no abdominal pain    HPI Comments: Larry Bradford is a 80 y.o. male with a history of hypertension and seizures who presents to the Emergency Department complaining of an itchy rash on his hands bilaterally and lower extremities that began suddenly today.  The patient's family member states that he has taken Benadryl within the last 3-4 days.  He last took Benadryl at 2 PM today.  He has also applied Cortisone 10 cream to his legs with temporary relief to his symptoms.  The patient's family member states that the patient has also been complaining of lightheadedness.  The patient's wife states that the patient did not experience any diaphoresis with his lightheadedness.  He states that he has been feeling weaker within the past couple of weeks.  He has not noticed any decrease in his eating or drinking.  He does not endorse abdominal pain or chest pain as associated symptoms.   He takes Keppra to treat his seizures.  He has not had a seizure in years.  He has not seen his PCP for his symptoms.  He had a coronary bypass artery graft placed 10 years ago.    Past Medical History  Diagnosis Date  . Seizures     Onset in Sept 2006  . Cataract   . Hard of hearing   . Renal disorder   . Depression   . HTN (hypertension)   . BPH (benign prostatic hyperplasia)   . CAD (coronary artery disease)     s/p CABG in 2002  . Hyperlipidemia   . OSA (obstructive sleep apnea)   . Aortic aneurysm     3 CM in 2007  . Carotid stenosis   . DDD (degenerative disc  disease)   . Trigeminal neuralgia   . Lower GI bleed   . Stroke    Past Surgical History  Procedure Laterality Date  . Coronary artery bypass graft  2002  . Temporal artery biopsy / ligation  2004   Family History  Problem Relation Age of Onset  . CAD Brother    History  Substance Use Topics  . Smoking status: Never Smoker   . Smokeless tobacco: Never Used  . Alcohol Use: No    Review of Systems  Constitutional: Negative for diaphoresis.  Cardiovascular: Negative for chest pain.  Gastrointestinal: Negative for abdominal pain.  Skin: Positive for rash.  Neurological: Positive for weakness and light-headedness.  All other systems reviewed and are negative.   Allergies  Review of patient's allergies indicates no known allergies.  Home Medications   Prior to Admission medications   Medication Sig Start Date End Date Taking? Authorizing Provider  levETIRAcetam (KEPPRA) 500 MG tablet Take 500 mg by mouth every 12 (twelve) hours.    Historical Provider, MD  LORazepam (ATIVAN) 1 MG tablet Take 1 tablet (1 mg total) by mouth 3 (three) times daily as needed for anxiety. 12/28/13   Threasa Beards, MD  polyvinyl alcohol (LIQUIFILM TEARS) 1.4 %  ophthalmic solution Place 1 drop into both eyes 4 (four) times daily. 01/11/13   Thurnell Lose, MD  triamcinolone cream (KENALOG) 0.1 % Apply 1 application topically 2 (two) times daily. 12/28/13   Threasa Beards, MD   Triage Vitals: BP 83/67  Pulse 119  Temp(Src) 97.7 F (36.5 C) (Oral)  Resp 20  Ht 5\' 7"  (1.702 m)  Wt 169 lb (76.658 kg)  BMI 26.46 kg/m2  SpO2 97%  Physical Exam  Nursing note and vitals reviewed. Constitutional: He is oriented to person, place, and time. He appears well-developed and well-nourished.  HENT:  Head: Normocephalic and atraumatic.  Eyes: EOM are normal.  Bilateral conjunctival erythema with tearing bilaterally.    Neck: Normal range of motion.  Cardiovascular: Normal rate.   Pulmonary/Chest:  Effort normal.  Abdominal: Soft. Bowel sounds are normal. There is no tenderness.  Musculoskeletal: Normal range of motion.  Neurological: He is alert and oriented to person, place, and time.  Skin: Skin is warm and dry.  Patches of raise erythema with scaling with associated excoriations on bilateral hands and lower extremities.    Psychiatric: He has a normal mood and affect. His behavior is normal.    ED Course  Procedures (including critical care time)  DIAGNOSTIC STUDIES: Oxygen Saturation is 97% on room air, adequate by my interpretation.    COORDINATION OF CARE: 8:37 PM- Discussed a clinical suspicion of eczema.  Discussed obtaining blood work and UA.  The patient's family agreed to the treatment plan.   Labs Review Labs Reviewed  BASIC METABOLIC PANEL - Abnormal; Notable for the following:    Glucose, Bld 149 (*)    Creatinine, Ser 1.40 (*)    GFR calc non Af Amer 41 (*)    GFR calc Af Amer 48 (*)    All other components within normal limits  URINALYSIS, ROUTINE W REFLEX MICROSCOPIC - Abnormal; Notable for the following:    Glucose, UA 100 (*)    All other components within normal limits  HEPATIC FUNCTION PANEL - Abnormal; Notable for the following:    Albumin 3.3 (*)    All other components within normal limits  CBC  TROPONIN I    Imaging Review No results found.   EKG Interpretation   Date/Time:  Friday December 28 2013 20:59:30 EDT Ventricular Rate:  108 PR Interval:  112 QRS Duration: 134 QT Interval:  364 QTC Calculation: 487 R Axis:   92 Text Interpretation:  sinus tachycardia Right bundle branch block T wave  abnormality, consider lateral ischemia Abnormal ECG Since previous tracing  rate faster Confirmed by [facility]  MD, Jaymar Loeber [id number]) on 12/28/2013 11:40:16  PM      MDM   Final diagnoses:  Rash  Other fatigue  Anxiety    Pt presenting with c/o itchy rash on his hands and lower extremities which has been present for some time- benadryl not  helping.  Wife also states he is very anxious and scratches at the rash a lot.  He also c/o generalized weakness.  Workup in the ED reassuring.  Family and wife are wanting an appointment with a new PMD - they were given referral information.  Initially they were very unhappy before my being able to even examine the patient, they asked multiple times for a 'real doctor', saying "isn't there a doctor here that can actually help him"  This was prior to completing my history and physical- family very anxious.  I explained to the best of my  ability that I was going to examine patient and was taking a history to get information about the patients condition.  That we would do lab testing and if things were reassuring, then we could give him a prescription for the rash and itching.  Wife began crying and shouting and saying that I didn't want to help him.  I had a long discussion and tried to reassure family that we were going to help him the best we could in the ED and would give them information to call primary care doctor's.  Family expressed frustration that I didn't know which primary care doctors were accepting patients in the area and that I could not make the appointment for them.  By the end of the visit, family was much more relaxed and seemed satisfied with their care.  They were laughing and joking and seemed happy at the end of the visit.    I personally performed the services described in this documentation, which was scribed in my presence. The recorded information has been reviewed and is accurate.    Threasa Beards, MD 12/30/13 [phone]

## 2013-12-28 NOTE — Discharge Instructions (Signed)
Return to the ED with any concerns including difficulty breathing, chest pain, fainting, vomiting and not able to keep down liquids, abdominal pain, fever/chills, decreased level of alertness/lethargy, or any other alarming symptoms Emergency Department Resource Guide 1) Find a Doctor and Pay Out of Pocket Although you won't have to find out who is covered by your insurance plan, it is a good idea to ask around and get recommendations. You will then need to call the office and see if the doctor you have chosen will accept you as a new patient and what types of options they offer for patients who are self-pay. Some doctors offer discounts or will set up payment plans for their patients who do not have insurance, but you will need to ask so you aren't surprised when you get to your appointment.  2) Contact Your Local Health Department Not all health departments have doctors that can see patients for sick visits, but many do, so it is worth a call to see if yours does. If you don't know where your local health department is, you can check in your phone book. The CDC also has a tool to help you locate your state's health department, and many state websites also have listings of all of their local health departments.  3) Find a [facility] If your illness is not likely to be very severe or complicated, you may want to try a walk in clinic. These are popping up all over the country in pharmacies, drugstores, and shopping centers. They're usually staffed by nurse practitioners or physician assistants that have been trained to treat common illnesses and complaints. They're usually fairly quick and inexpensive. However, if you have serious medical issues or chronic medical problems, these are probably not your best option.  No Primary Care Doctor: - Call Health Connect at  [phone] - they can help you locate a primary care doctor that  accepts your insurance, provides certain services, etc. - Physician  Referral Service- [phone]  Chronic Pain Problems: Organization         Address  Phone   Notes  [facility]  [phone] Patients need to be referred by their primary care doctor.   Medication Assistance: Organization         Address  Phone   Notes  [facility] Medication [facility] [address] [place], [place] [address] [phone] --Must be a resident of [facility] -- Must have NO insurance coverage whatsoever (no Medicaid/ Medicare, etc.) -- The pt. MUST have a primary care doctor that directs their care regularly and follows them in the community   MedAssist  [phone]   [employer]  [phone]    Agencies that provide inexpensive medical care: Organization         Address  Phone   Notes  [place]  [phone]   [facility]    [phone]   [facility] [place], [place] [address] [phone]   [place] [address], [place] [phone]   Planned Parenthood    [phone]   [facility]    [phone]   [place] [address], [place] Phone:  [phone], Fax:  [phone] Hours of Operation:  9 am - 6 pm, M-F.  Also accepts Medicaid/Medicare and self-pay.  [facility]  [address], [place] Phone: [phone], Fax: [phone]. Hours of Operation:  8:30 am - 5:30 pm, M-F.  Also accepts Medicaid and self-pay.  [facility] [address] [place] Phone: [phone]   [place], [place], [place] [phone], Ext. [phone] Mondays & Thursdays: 7-9 AM.  First 15 patients are seen on a first come, first serve basis.    [place] Providers:  Organization         Address  Phone   Notes  [facility] [address], [place] [phone] Also accepts self-pay patients.  [facility] [address] [place] [address], [place]  [phone]   [place], [address], [place] [phone]   [facility] [address], [place] [phone]   Lucianne Lei [address], [place]   [phone] Only accepts [place] Access [place] patients after they have their name applied to their card.   Self-Pay (no insurance) in [facility]:  Organization         Address  Phone   Notes  Sickle Cell Patients, [facility] [place] [phone]   [facility] [place] [phone]   [facility] [place]  [place], [place], [place] [phone]   [facility]/Dr. Osei-Bonsu  [address] [place] or [address], [place] [phone] Phone number for both [place] and [place] locations is the same.  [facility] [address] [place] [phone]   [facility] [address] [place] or [address] [phone]   [facility] [address], [place] [phone], phone; [phone], fax Sees patients 1st and 3rd Saturday of every month.  Must not qualify for public or private insurance (i.e. Medicaid, Medicare, [place] Health Choice, Veterans' Benefits)  Household income should be no more than 200% of the poverty level The clinic cannot treat you if you are pregnant or think you are pregnant  Sexually transmitted diseases are not treated at the clinic.    Dental Care: Organization         Address  Phone  Notes  [facility] Department of [facility] [place] [phone] Accepts children up to age 26 who are enrolled  in [place] or [place]; pregnant women with a Medicaid card; and children who have applied for Medicaid or [place] Health Choice, but were declined, whose parents can pay a reduced fee at time of service.  [facility] Department of [facility]  [address], [place] [phone] Accepts children up to age 33 who are enrolled in [place] or [place]; pregnant women with a Medicaid card; and children who have applied for Medicaid or [place] Health Choice, but were declined, whose parents can pay a reduced fee at time of service.  [facility] Access PROGRAM  [place] [phone] Patients are seen by appointment only. Walk-ins are not accepted. [place] will see patients 67 years of age and older. Monday - Tuesday (8am-5pm) Most Wednesdays (8:30-5pm) $30 per visit, cash only  [place]  Access PROGRAM  [address], [facility] [phone] Patients are seen by appointment only. Walk-ins are not accepted. [place] will see patients 16 years of age and older. One Wednesday Evening (Monthly: Volunteer Based).  $30 per visit, cash only  [place]  [phone] for adults; Children under age 43, call Graduate Pediatric Dentistry at [phone]. Children aged 42-14, please call [phone] to request a pediatric application.  Dental services are provided in all areas of dental care including fillings, crowns and bridges, complete and partial dentures, implants, gum treatment, root canals, and extractions. Preventive care is also provided. Treatment is provided to both adults and children. Patients are selected via a lottery and there is often a waiting list.   [facility] [address], [place]  [phone] [email or web address]   [facility] [address] [place], [place] [phone], Ext. [phone] Second and Fourth Thursday of each month, opens at  6:30 AM; Clinic ends at 9 AM.  Patients are seen on a first-come first-served basis, and a limited number are seen during each clinic.   [facility]  [address] Hillard Danker [place], [place] [phone]   Eligibility Requirements You must have lived in [place], [place], or [place] counties for at least the last three months.   You cannot be eligible for state or federal sponsored [employer], including [employer], [place], or [employer].   You generally cannot be eligible for healthcare insurance through your employer.    How to apply: Eligibility screenings are held every Tuesday and Wednesday afternoon from 1:00 pm until 4:00 pm. You do not need an appointment for the interview!  [facility] [address], [place], [place]   [place]  [place] Department  [place]  [phone]    Behavioral Health Resources in the Community: Intensive Outpatient Programs Organization         Address  Phone  Notes  [place]. [address] [place], [place] [phone]   [facility] [address], [place], [place]   ADS: Alcohol & Drug Svcs [address] [place], [place]   [place] [address],  [place], [place] or [phone]   Substance Abuse Resources Organization         Address  Phone  Notes  Alcohol and Drug Services  [phone]   [place]  [phone]   [facility] [place]   Chinita Pester  [phone]   Residential & Outpatient Substance Abuse Program  [phone]   Psychological Services Organization         Address  Phone  Notes  [facility] [place]  [place]  [phone]   [place] [address] [place]  [phone] or [phone]    Mobile Crisis Teams Organization         Address  Phone  Notes  [facility], Mobile Crisis Care Unit  [phone]   [facility] Psychotherapeutic Services  [address] [place], [place]   [place] [address] [place] [phone]    Self-Help/Support Groups Organization         Address  Phone             Notes  Mental Health  [place] - variety of support groups  336- H3156881 Call for more information  Narcotics Anonymous (NA), [facility] [address], [employer] [place]  2 meetings at this location   Special educational needs teacher         Address  Phone  Notes  [facility] Residential Treatment [place],    [place]  [phone]   [facility]  [address], [place] [address], [place], [place]   [place], [place] [phone] Admissions: 8am-3pm M-F  Incentives Substance [place] [address]    [place], [place] [phone]   [facility] [address] [place], [place], [place]   [facility] [address],  [place], [place]   [facility] Programs - Intensive Outpatient [address], [place], [place]   [facility] ([place] [address] [place].,  [place], [place] [phone] or [phone]   [facility] Services (RTS) [address], [place], [place] Accepts Medicaid  [facility] [place] [address]  [place] [phone] Substance Abuse/Addiction Treatment   [facility] Organization         Address  Phone  Notes  [facility]  [phone]   Domenic Schwab, PhD [address] [place]lis [place], [place]   [phone] or [phone]   [place], [place] [phone]   [facility]  [address], [place], [place] [phone] Insurance/Medicaid/sponsorship through [facility] and Families [address] [place]                                    [place], [place] [phone] [place] [address], [place] [phone]    Dr. Adele Schilder  [phone]   [facility] Dept. 1) [address], [place] 2) [place] 3)  [address], [place] [phone]  [phone]   [place] [phone] or [phone] (After Hours)

## 2013-12-28 NOTE — ED Notes (Signed)
C/o rash on hands onset today w burning,  Hx of same on legs that comes and goes

## 2014-12-08 ENCOUNTER — Encounter (HOSPITAL_BASED_OUTPATIENT_CLINIC_OR_DEPARTMENT_OTHER): Payer: Self-pay | Admitting: Emergency Medicine

## 2014-12-08 ENCOUNTER — Emergency Department (HOSPITAL_BASED_OUTPATIENT_CLINIC_OR_DEPARTMENT_OTHER)
Admission: EM | Admit: 2014-12-08 | Discharge: 2014-12-08 | Discharge status: Against Medical Advice | Payer: Medicare Other | Attending: Emergency Medicine | Admitting: Emergency Medicine

## 2014-12-08 DIAGNOSIS — H578 Other specified disorders of eye and adnexa: Secondary | ICD-10-CM | POA: Diagnosis present

## 2014-12-08 DIAGNOSIS — I10 Essential (primary) hypertension: Secondary | ICD-10-CM | POA: Insufficient documentation

## 2014-12-08 NOTE — ED Notes (Signed)
Pt in c/o worsening eye irritation x 1 week. Bilateral eyes positive for erythema and tearing, conjunctiva are WDL.

## 2014-12-08 NOTE — ED Notes (Signed)
Patients family did not want the patient to stay any longer

## 2015-01-21 ENCOUNTER — Encounter (HOSPITAL_COMMUNITY): Payer: Self-pay | Admitting: *Deleted

## 2015-01-21 ENCOUNTER — Emergency Department (HOSPITAL_COMMUNITY)
Admission: EM | Admit: 2015-01-21 | Discharge: 2015-01-21 | Disposition: A | Discharge status: Home / Self Care | Payer: Medicare Other | Attending: Emergency Medicine | Admitting: Emergency Medicine

## 2015-01-21 DIAGNOSIS — Z8739 Personal history of other diseases of the musculoskeletal system and connective tissue: Secondary | ICD-10-CM | POA: Diagnosis not present

## 2015-01-21 DIAGNOSIS — H409 Unspecified glaucoma: Secondary | ICD-10-CM | POA: Insufficient documentation

## 2015-01-21 DIAGNOSIS — H9190 Unspecified hearing loss, unspecified ear: Secondary | ICD-10-CM | POA: Diagnosis not present

## 2015-01-21 DIAGNOSIS — D170 Benign lipomatous neoplasm of skin and subcutaneous tissue of head, face and neck: Secondary | ICD-10-CM | POA: Diagnosis not present

## 2015-01-21 DIAGNOSIS — I2510 Atherosclerotic heart disease of native coronary artery without angina pectoris: Secondary | ICD-10-CM | POA: Diagnosis not present

## 2015-01-21 DIAGNOSIS — Z8719 Personal history of other diseases of the digestive system: Secondary | ICD-10-CM | POA: Insufficient documentation

## 2015-01-21 DIAGNOSIS — Z79899 Other long term (current) drug therapy: Secondary | ICD-10-CM | POA: Insufficient documentation

## 2015-01-21 DIAGNOSIS — G40909 Epilepsy, unspecified, not intractable, without status epilepticus: Secondary | ICD-10-CM | POA: Insufficient documentation

## 2015-01-21 DIAGNOSIS — F329 Major depressive disorder, single episode, unspecified: Secondary | ICD-10-CM | POA: Diagnosis not present

## 2015-01-21 DIAGNOSIS — Z87438 Personal history of other diseases of male genital organs: Secondary | ICD-10-CM | POA: Insufficient documentation

## 2015-01-21 DIAGNOSIS — I10 Essential (primary) hypertension: Secondary | ICD-10-CM | POA: Insufficient documentation

## 2015-01-21 DIAGNOSIS — Z8673 Personal history of transient ischemic attack (TIA), and cerebral infarction without residual deficits: Secondary | ICD-10-CM | POA: Insufficient documentation

## 2015-01-21 DIAGNOSIS — Z8639 Personal history of other endocrine, nutritional and metabolic disease: Secondary | ICD-10-CM | POA: Diagnosis not present

## 2015-01-21 DIAGNOSIS — Z87448 Personal history of other diseases of urinary system: Secondary | ICD-10-CM | POA: Diagnosis not present

## 2015-01-21 DIAGNOSIS — M542 Cervicalgia: Secondary | ICD-10-CM | POA: Diagnosis present

## 2015-01-21 LAB — COMPREHENSIVE METABOLIC PANEL
ALT: 17 U/L (ref 17–63)
AST: 22 U/L (ref 15–41)
Albumin: 3.4 g/dL — ABNORMAL LOW (ref 3.5–5.0)
Alkaline Phosphatase: 58 U/L (ref 38–126)
Anion gap: 7 (ref 5–15)
BILIRUBIN TOTAL: 0.7 mg/dL (ref 0.3–1.2)
BUN: 12 mg/dL (ref 6–20)
CO2: 26 mmol/L (ref 22–32)
CREATININE: 1.22 mg/dL (ref 0.61–1.24)
Calcium: 9.3 mg/dL (ref 8.9–10.3)
Chloride: 106 mmol/L (ref 101–111)
GFR calc Af Amer: 56 mL/min — ABNORMAL LOW (ref >60)
GFR, EST NON AFRICAN AMERICAN: 48 mL/min — AB (ref >60)
Glucose, Bld: 105 mg/dL — ABNORMAL HIGH (ref 65–99)
Potassium: 4.6 mmol/L (ref 3.5–5.1)
Sodium: 139 mmol/L (ref 135–145)
TOTAL PROTEIN: 7.4 g/dL (ref 6.5–8.1)

## 2015-01-21 LAB — CBC WITH DIFFERENTIAL/PLATELET
BASOS ABS: 0 10*3/uL (ref 0.0–0.1)
Basophils Relative: 1 % (ref 0–1)
Eosinophils Absolute: 0.4 10*3/uL (ref 0.0–0.7)
Eosinophils Relative: 5 % (ref 0–5)
HCT: 44.6 % (ref 39.0–52.0)
Hemoglobin: 14.9 g/dL (ref 13.0–17.0)
LYMPHS ABS: 1.8 10*3/uL (ref 0.7–4.0)
Lymphocytes Relative: 22 % (ref 12–46)
MCH: 31.5 pg (ref 26.0–34.0)
MCHC: 33.4 g/dL (ref 30.0–36.0)
MCV: 94.3 fL (ref 78.0–100.0)
Monocytes Absolute: 0.7 10*3/uL (ref 0.1–1.0)
Monocytes Relative: 8 % (ref 3–12)
NEUTROS ABS: 5.1 10*3/uL (ref 1.7–7.7)
Neutrophils Relative %: 64 % (ref 43–77)
Platelets: 222 10*3/uL (ref 150–400)
RBC: 4.73 MIL/uL (ref 4.22–5.81)
RDW: 13.7 % (ref 11.5–15.5)
WBC: 8 10*3/uL (ref 4.0–10.5)

## 2015-01-21 MED ORDER — LEVETIRACETAM 500 MG PO TABS: 500.0000 mg | ORAL_TABLET | Freq: Two times a day (BID) | ORAL | Status: AC

## 2015-01-21 NOTE — ED Provider Notes (Signed)
CSN: [id number]     Arrival date & time 01/21/15  1209 History   First MD Initiated Contact with Patient 01/21/15 1333     Chief Complaint  Patient presents with  . Neck Pain  . Skin Problem     (Consider location/radiation/quality/duration/timing/severity/associated sxs/prior Treatment) HPI Larry Bradford is a 81 y.o. male who comes in for evaluation today of a lipoma a medication refill.  Patient is accompanied by family in the room who reports the patient has had a lipoma for many years, but most recently has become more painful and is causing the patient more distress. Patient reports that he has been unable to comfortably sleep over the past few days due to this discomfort. Family also reports that patient typically takes [place] and has been fairly compliant with this medication and they would like to have a refill before they run out. They're requesting a referral to a new PCP. Patient denies any headache, vision changes, chest pain, shortness of breath, nausea vomiting, numbness or weakness, abdominal pain, urinary symptoms, constipation or diarrhea.  Past Medical History  Diagnosis Date  . Seizures     Onset in Sept 2006  . Cataract   . Hard of hearing   . Renal disorder   . Depression   . HTN (hypertension)   . BPH (benign prostatic hyperplasia)   . CAD (coronary artery disease)     s/p CABG in 2002  . Hyperlipidemia   . OSA (obstructive sleep apnea)   . Aortic aneurysm     3 CM in 2007  . Carotid stenosis   . DDD (degenerative disc disease)   . Trigeminal neuralgia   . Lower GI bleed   . Stroke    Past Surgical History  Procedure Laterality Date  . Coronary artery bypass graft  2002  . Temporal artery biopsy / ligation  2004   Family History  Problem Relation Age of Onset  . CAD Brother    Social History  Substance Use Topics  . Smoking status: Never Smoker   . Smokeless tobacco: Never Used  . Alcohol Use: No    Review of Systems A 10 point review of  systems was completed and was negative except for pertinent positives and negatives as mentioned in the history of present illness     Allergies  Review of patient's allergies indicates no known allergies.  Home Medications   Prior to Admission medications   Medication Sig Start Date End Date Taking? Authorizing Provider  levETIRAcetam (KEPPRA) 500 MG tablet Take 1 tablet (500 mg total) by mouth every 12 (twelve) hours. 01/21/15   Comer Locket, PA-C  LORazepam (ATIVAN) 1 MG tablet Take 1 tablet (1 mg total) by mouth 3 (three) times daily as needed for anxiety. 12/28/13   Alfonzo Beers, MD  polyvinyl alcohol (LIQUIFILM TEARS) 1.4 % ophthalmic solution Place 1 drop into both eyes 4 (four) times daily. 01/11/13   Thurnell Lose, MD  triamcinolone cream (KENALOG) 0.1 % Apply 1 application topically 2 (two) times daily. 12/28/13   Alfonzo Beers, MD   BP 146/74 mmHg  Pulse 112  Temp(Src) 97.6 F (36.4 C) (Oral)  Resp 22  Wt 168 lb (76.204 kg)  SpO2 100% Physical Exam  Constitutional: He is oriented to person, place, and time. He appears well-developed and well-nourished.  Very well-appearing 81 year old man  HENT:  Head: Normocephalic and atraumatic.  Mouth/Throat: Oropharynx is clear and moist.  Eyes: Conjunctivae are normal. Pupils are equal, round, and  reactive to light. Right eye exhibits no discharge. Left eye exhibits no discharge. No scleral icterus.  Neck: Neck supple.  Small , circumferential area approximately 3 cm in diameter consistent with lipoma at the base of cervical spine.no erythema or drainage.  Cardiovascular: Normal rate, regular rhythm and normal heart sounds.   Pulmonary/Chest: Effort normal and breath sounds normal. No respiratory distress. He has no wheezes. He has no rales.  Abdominal: Soft. There is no tenderness.  Musculoskeletal: He exhibits no tenderness.  Neurological: He is alert and oriented to person, place, and time.  Cranial Nerves II-XII grossly  intact  Skin: Skin is warm and dry. No rash noted.  Psychiatric: He has a normal mood and affect.  Nursing note and vitals reviewed.   ED Course  Procedures (including critical care time) Labs Review Labs Reviewed  COMPREHENSIVE METABOLIC PANEL - Abnormal; Notable for the following:    Glucose, Bld 105 (*)    Albumin 3.4 (*)    GFR calc non Af Amer 48 (*)    GFR calc Af Amer 56 (*)    All other components within normal limits  CBC WITH DIFFERENTIAL/PLATELET  LEVETIRACETAM LEVEL    Imaging Review No results found. I have personally reviewed and evaluated these images and lab results as part of my medical decision-making.   EKG Interpretation None     Meds given in ED:  Medications - No data to display  Discharge Medication List as of 01/21/2015  2:52 PM     Filed Vitals:   01/21/15 1218 01/21/15 1514  BP: 131/80 146/74  Pulse: 109 112  Temp: 97.8 F (36.6 C) 97.6 F (36.4 C)  TempSrc: Oral Oral  Resp: 18 22  Weight: 168 lb (76.204 kg)   SpO2: 96% 100%    MDM  Vitals stable, no tachycardia on my exam, heart rate 80-90s -afebrile Pt resting comfortably in ED. PE--patient presents with lipoma. No evidence of abscess, infected epidermal cyst or other infection  Contacted patient's dermatologist, will contact patient with surgical appointment for removal of lipoma. Refilled patient's Keppra. Also provided patient with referral to call health and wellness in order to establish primary care. Patient also given list of new primary care providers in the area.  No evidence of other acute or emergent pathology at this time.I discussed all relevant lab findings and imaging results with pt and they verbalized understanding.patient's family at bedside and they agree with above plan and expressed no other concerns at this time. Patient appears well, is stable and appropriate for discharge. Discussed f/u with PCP within 48 hrs and return precautions, pt very amenable to  plan. Prior to patient discharge, I discussed and reviewed this case with Dr.Pfeiffer   Final diagnoses:  Lipoma of neck        Comer Locket, PA-C 01/21/15 [place], MD 02/05/15 0800

## 2015-01-21 NOTE — ED Notes (Signed)
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  Patient has not been able to sleep due to pain.   Patient family is concerned that patient has not had any blood work done in 2 years.  He is on seizure medications and they want refill.   He has a case of dermatitis as well.  He was seen by a dermatitis and he is on antibiotics for skin infection on the knees and the eyes.  Patient is alert.  States he just wants that lump removed from his neck.

## 2015-01-21 NOTE — Discharge Instructions (Signed)
Please follow-up with health and wellness in order to establish primary care. Follow-up with your dermatologist as needed for removal of your lipoma. Return to ED for new or worsening symptoms. Continue taking your Keppra as previous prescribed.  Lipoma A lipoma is a noncancerous (benign) tumor composed of fat cells. They are usually found under the skin (subcutaneous). A lipoma may occur in any tissue of the body that contains fat. Common areas for lipomas to appear include the back, shoulders, buttocks, and thighs. Lipomas are a very common soft tissue growth. They are soft and grow slowly. Most problems caused by a lipoma depend on where it is growing. DIAGNOSIS  A lipoma can be diagnosed with a physical exam. These tumors rarely become cancerous, but radiographic studies can help determine this for certain. Studies used may include:  Computerized X-ray scans (CT or CAT scan).  Computerized magnetic scans (MRI). TREATMENT  Small lipomas that are not causing problems may be watched. If a lipoma continues to enlarge or causes problems, removal is often the best treatment. Lipomas can also be removed to improve appearance. Surgery is done to remove the fatty cells and the surrounding capsule. Most often, this is done with medicine that numbs the area (local anesthetic). The removed tissue is examined under a microscope to make sure it is not cancerous. Keep all follow-up appointments with your caregiver. SEEK MEDICAL CARE IF:   The lipoma becomes larger or hard.  The lipoma becomes painful, red, or increasingly swollen. These could be signs of infection or a more serious condition. Document Released: 05/07/2002 Document Revised: 08/09/2011 Document Reviewed: 10/17/2009 [facility] Patient Information 2015 [place], [place]. This information is not intended to replace advice given to you by your health care provider. Make sure you discuss any questions you have with your health care provider.

## 2015-01-22 LAB — LEVETIRACETAM LEVEL: LEVETIRACETAM: NOT DETECTED ug/mL (ref 10.0–40.0)

## 2015-01-24 ENCOUNTER — Encounter (HOSPITAL_COMMUNITY): Payer: Self-pay

## 2015-01-24 ENCOUNTER — Ambulatory Visit (HOSPITAL_COMMUNITY)
Admission: RE | Admit: 2015-01-24 | Discharge: 2015-01-24 | Disposition: A | Discharge status: Home / Self Care | Payer: Medicare Other | Source: Ambulatory Visit | Attending: Emergency Medicine | Admitting: Emergency Medicine

## 2015-01-24 ENCOUNTER — Ambulatory Visit (INDEPENDENT_AMBULATORY_CARE_PROVIDER_SITE_OTHER): Payer: Medicare Other | Admitting: Emergency Medicine

## 2015-01-24 ENCOUNTER — Ambulatory Visit (INDEPENDENT_AMBULATORY_CARE_PROVIDER_SITE_OTHER): Payer: Medicare Other

## 2015-01-24 VITALS — BP 114/74 | HR 111 | Temp 98.2°F | Resp 16 | Ht 67.0 in | Wt 168.0 lb

## 2015-01-24 DIAGNOSIS — M549 Dorsalgia, unspecified: Secondary | ICD-10-CM | POA: Insufficient documentation

## 2015-01-24 DIAGNOSIS — M542 Cervicalgia: Secondary | ICD-10-CM | POA: Diagnosis not present

## 2015-01-24 DIAGNOSIS — R0781 Pleurodynia: Secondary | ICD-10-CM | POA: Insufficient documentation

## 2015-01-24 DIAGNOSIS — R739 Hyperglycemia, unspecified: Secondary | ICD-10-CM | POA: Diagnosis not present

## 2015-01-24 DIAGNOSIS — R109 Unspecified abdominal pain: Secondary | ICD-10-CM

## 2015-01-24 LAB — POCT CBC
Granulocyte percent: 68.2 %G (ref 37–80)
HCT, POC: 44 % (ref 43.5–53.7)
HEMOGLOBIN: 14.4 g/dL (ref 14.1–18.1)
Lymph, poc: 1.9 (ref 0.6–3.4)
MCH, POC: 29.5 pg (ref 27–31.2)
MCHC: 32.8 g/dL (ref 31.8–35.4)
MCV: 89.9 fL (ref 80–97)
MID (CBC): 0.6 (ref 0–0.9)
MPV: 6.7 fL (ref 0–99.8)
POC Granulocyte: 5.4 (ref 2–6.9)
POC LYMPH PERCENT: 24.3 %L (ref 10–50)
POC MID %: 7.5 %M (ref 0–12)
Platelet Count, POC: 259 10*3/uL (ref 142–424)
RBC: 4.89 M/uL (ref 4.69–6.13)
RDW, POC: 13.9 %
WBC: 7.9 10*3/uL (ref 4.6–10.2)

## 2015-01-24 LAB — POCT URINALYSIS DIPSTICK
Bilirubin, UA: NEGATIVE
Blood, UA: NEGATIVE
Glucose, UA: NEGATIVE
Leukocytes, UA: NEGATIVE
Nitrite, UA: NEGATIVE
PH UA: 5
PROTEIN UA: NEGATIVE
UROBILINOGEN UA: 0.2

## 2015-01-24 LAB — POCT UA - MICROSCOPIC ONLY
Bacteria, U Microscopic: NEGATIVE
CASTS, UR, LPF, POC: NEGATIVE
Crystals, Ur, HPF, POC: NEGATIVE
Mucus, UA: POSITIVE
RBC, urine, microscopic: NEGATIVE
YEAST UA: NEGATIVE

## 2015-01-24 LAB — GLUCOSE, POCT (MANUAL RESULT ENTRY): POC Glucose: 65 mg/dl — AB (ref 70–99)

## 2015-01-24 NOTE — Progress Notes (Addendum)
This chart was scribed for Nena Jordan, MD by [facility], medical scribe at [facility].The patient was seen in exam room 03 and the patient's care was started at 2:16 PM.  Chief Complaint:  Chief Complaint  Patient presents with  . Back Pain    lower back, fell x 2 weeks ago    HPI: Larry Bradford is a 81 y.o. male who reports to [facility] today complaining of lower back pain and left shoulder pain after a fall which occurred two weeks ago. Walking into his home and fell. Had not taken his seizure medicine for several days, not detected in the ED. PCP was Dr. Ashby Dawes, switching to Dr. Sheryle Hail. Lives at home with his wife.   Past Medical History  Diagnosis Date  . Seizures     Onset in Sept 2006  . Cataract   . Hard of hearing   . Renal disorder   . Depression   . HTN (hypertension)   . BPH (benign prostatic hyperplasia)   . CAD (coronary artery disease)     s/p CABG in 2002  . Hyperlipidemia   . OSA (obstructive sleep apnea)   . Aortic aneurysm     3 CM in 2007  . Carotid stenosis   . DDD (degenerative disc disease)   . Trigeminal neuralgia   . Lower GI bleed   . Stroke   . Allergy   . CHF (congestive heart failure)   . History of heart bypass surgery    Past Surgical History  Procedure Laterality Date  . Coronary artery bypass graft  2002  . Temporal artery biopsy / ligation  2004   Social History   Social History  . Marital Status: Married    Spouse Name: N/A  . Number of Children: N/A  . Years of Education: N/A   Social History Main Topics  . Smoking status: Never Smoker   . Smokeless tobacco: Never Used  . Alcohol Use: No  . Drug Use: No  . Sexual Activity: Not Asked   Other Topics Concern  . None   Social History Narrative   reports that he has never smoked. He does not have any smokeless tobacco history on file. He reports that he does not drink alcohol. His drug history not on file.         Family History  Problem  Relation Age of Onset  . CAD Brother   . Heart disease Brother   . Cancer Mother    Not on File Prior to Admission medications   Medication Sig Start Date End Date Taking? Authorizing Provider  diclofenac (VOLTAREN) 50 MG EC tablet Take 50 mg by mouth 2 (two) times daily.   Yes Historical Provider, MD  hydrOXYzine (ATARAX/VISTARIL) 25 MG tablet Take 25 mg by mouth every 8 (eight) hours as needed.   Yes Historical Provider, MD  levETIRAcetam (KEPPRA) 500 MG tablet Take 1 tablet (500 mg total) by mouth every 12 (twelve) hours. 01/21/15  Yes Benjamin Cartner, PA-C  LORazepam (ATIVAN) 1 MG tablet Take 1 tablet (1 mg total) by mouth 3 (three) times daily as needed for anxiety. Patient not taking: Reported on 01/24/2015 12/28/13   Alfonzo Beers, MD  polyvinyl alcohol (LIQUIFILM TEARS) 1.4 % ophthalmic solution Place 1 drop into both eyes 4 (four) times daily. Patient not taking: Reported on 01/24/2015 01/11/13   Thurnell Lose, MD  triamcinolone cream (KENALOG) 0.1 % Apply 1 application topically 2 (two)  times daily. Patient not taking: Reported on 01/24/2015 12/28/13   Alfonzo Beers, MD    ROS: The patient denies fevers, chills, night sweats, unintentional weight loss, chest pain, palpitations, wheezing, dyspnea on exertion, nausea, vomiting, abdominal pain, dysuria, hematuria, melena, numbness, weakness, or tingling.  All other systems have been reviewed and were otherwise negative with the exception of those mentioned in the HPI and as above.    PHYSICAL EXAM: Filed Vitals:   01/24/15 1353  BP: 114/74  Pulse: 111  Temp: 98.2 F (36.8 C)  Resp: 16   Body mass index is 26.31 kg/(m^2).  General: Alert, no acute distress, very hard of hearing. HEENT:  Normocephalic, atraumatic, oropharynx patent. Limited ROM of his neck Eye: EOMI, PEERLDC Cardiovascular:  Tachycardia and regular rhythm, no rubs  or gallops.  No Carotid bruits, radial pulse intact. No pedal edema.  There is a grade 2 systolic  murmur at the left sternal border. Respiratory: Decreased breath sounds both bases..  No wheezes, rales, or rhonchi.  No cyanosis, no use of accessory musculature Abdominal: No organomegaly, abdomen is soft and non-tender, positive bowel sounds.  No masses. Musculoskeletal: Gait intact. No edema, tender left upper ribs. Skin: 3 cm lipoma over the back of his neck Neurologic: Facial musculature symmetric. Psychiatric: Patient acts appropriately throughout our interaction. Lymphatic: No cervical or submandibular lymphadenopathy Genitourinary/Anorectal: No acute findings  LABS: Results for orders placed or performed in visit on 01/24/15  POCT CBC  Result Value Ref Range   WBC 7.9 4.6 - 10.2 K/uL   Lymph, poc 1.9 0.6 - 3.4   POC LYMPH PERCENT 24.3 10 - 50 %L   MID (cbc) 0.6 0 - 0.9   POC MID % 7.5 0 - 12 %M   POC Granulocyte 5.4 2 - 6.9   Granulocyte percent 68.2 37 - 80 %G   RBC 4.89 4.69 - 6.13 M/uL   Hemoglobin 14.4 14.1 - 18.1 g/dL   HCT, POC 44.0 43.5 - 53.7 %   MCV 89.9 80 - 97 fL   MCH, POC 29.5 27 - 31.2 pg   MCHC 32.8 31.8 - 35.4 g/dL   RDW, POC 13.9 %   Platelet Count, POC 259 142 - 424 K/uL   MPV 6.7 0 - 99.8 fL  POCT glucose (manual entry)  Result Value Ref Range   POC Glucose 65 (A) 70 - 99 mg/dl  POCT urinalysis dipstick  Result Value Ref Range   Color, UA dk yellow    Clarity, UA clear    Glucose, UA neg    Bilirubin, UA neg    Ketones, UA trace    Spec Grav, UA >=1.030    Blood, UA neg    pH, UA 5.0    Protein, UA neg    Urobilinogen, UA 0.2    Nitrite, UA neg    Leukocytes, UA Negative Negative  POCT UA - Microscopic Only  Result Value Ref Range   WBC, Ur, HPF, POC 0-4    RBC, urine, microscopic neg    Bacteria, U Microscopic neg    Mucus, UA positive    Epithelial cells, urine per micros 0-4    Crystals, Ur, HPF, POC neg    Casts, Ur, LPF, POC neg    Yeast, UA neg     EKG/XRAY:   EKG is very similar to one year ago. There is an interventricular  conduction delay and an apparent junctional rhythm. I did hae Dr. Marlou Porch also look at it. Primary  read interpreted by Dr. Everlene Farrier at [facility]. On the lateral E spine there appears to be an upper thoracic compression fracture please comment. Please comment on any possible left rib fractures.  O  no pneumothorax was seen. No definite cervical fracture seen ASSESSMENT/PLAN:  patient is tender over the left side of his upper back and also over the thoracic vertebrae. We'll proceed with CT of this area. X-rays are suboptimal and difficult to evaluate. He is to take Tylenol for pain. He was advised to apply BenGay to the area No fractures on CT Gross sideeffects, risk and benefits, and alternatives of medications d/w patient. Patient is aware that all medications have potential sideeffects and we are unable to predict every sideeffect or drug-drug interaction that may occur.    Arlyss Queen MD 01/24/2015 3:54 PM

## 2015-02-07 ENCOUNTER — Encounter (HOSPITAL_COMMUNITY): Payer: Self-pay | Admitting: *Deleted

## 2015-02-07 ENCOUNTER — Emergency Department (HOSPITAL_COMMUNITY): Payer: Medicare Other

## 2015-02-07 ENCOUNTER — Inpatient Hospital Stay (HOSPITAL_COMMUNITY)
Admission: EM | Admit: 2015-02-07 | Discharge: 2015-02-12 | DRG: 092 | Disposition: A | Discharge status: Skilled Nursing Facility | Payer: Medicare Other | Attending: Internal Medicine | Admitting: Internal Medicine

## 2015-02-07 ENCOUNTER — Telehealth: Payer: Self-pay

## 2015-02-07 DIAGNOSIS — G40909 Epilepsy, unspecified, not intractable, without status epilepticus: Secondary | ICD-10-CM | POA: Diagnosis present

## 2015-02-07 DIAGNOSIS — I82409 Acute embolism and thrombosis of unspecified deep veins of unspecified lower extremity: Secondary | ICD-10-CM | POA: Diagnosis not present

## 2015-02-07 DIAGNOSIS — N401 Enlarged prostate with lower urinary tract symptoms: Secondary | ICD-10-CM | POA: Diagnosis present

## 2015-02-07 DIAGNOSIS — Z8673 Personal history of transient ischemic attack (TIA), and cerebral infarction without residual deficits: Secondary | ICD-10-CM | POA: Diagnosis not present

## 2015-02-07 DIAGNOSIS — G92 Toxic encephalopathy: Principal | ICD-10-CM | POA: Diagnosis present

## 2015-02-07 DIAGNOSIS — I714 Abdominal aortic aneurysm, without rupture: Secondary | ICD-10-CM | POA: Diagnosis present

## 2015-02-07 DIAGNOSIS — N400 Enlarged prostate without lower urinary tract symptoms: Secondary | ICD-10-CM | POA: Diagnosis present

## 2015-02-07 DIAGNOSIS — F329 Major depressive disorder, single episode, unspecified: Secondary | ICD-10-CM | POA: Diagnosis present

## 2015-02-07 DIAGNOSIS — I350 Nonrheumatic aortic (valve) stenosis: Secondary | ICD-10-CM | POA: Diagnosis present

## 2015-02-07 DIAGNOSIS — N183 Chronic kidney disease, stage 3 unspecified: Secondary | ICD-10-CM | POA: Diagnosis present

## 2015-02-07 DIAGNOSIS — I6529 Occlusion and stenosis of unspecified carotid artery: Secondary | ICD-10-CM | POA: Diagnosis present

## 2015-02-07 DIAGNOSIS — I25810 Atherosclerosis of coronary artery bypass graft(s) without angina pectoris: Secondary | ICD-10-CM | POA: Diagnosis not present

## 2015-02-07 DIAGNOSIS — R0902 Hypoxemia: Secondary | ICD-10-CM | POA: Diagnosis present

## 2015-02-07 DIAGNOSIS — R310 Gross hematuria: Secondary | ICD-10-CM | POA: Diagnosis not present

## 2015-02-07 DIAGNOSIS — W19XXXA Unspecified fall, initial encounter: Secondary | ICD-10-CM | POA: Diagnosis present

## 2015-02-07 DIAGNOSIS — R000 Tachycardia, unspecified: Secondary | ICD-10-CM | POA: Diagnosis present

## 2015-02-07 DIAGNOSIS — Z79899 Other long term (current) drug therapy: Secondary | ICD-10-CM

## 2015-02-07 DIAGNOSIS — I471 Supraventricular tachycardia: Secondary | ICD-10-CM | POA: Diagnosis present

## 2015-02-07 DIAGNOSIS — R4182 Altered mental status, unspecified: Secondary | ICD-10-CM | POA: Diagnosis present

## 2015-02-07 DIAGNOSIS — I10 Essential (primary) hypertension: Secondary | ICD-10-CM | POA: Diagnosis present

## 2015-02-07 DIAGNOSIS — E785 Hyperlipidemia, unspecified: Secondary | ICD-10-CM | POA: Diagnosis present

## 2015-02-07 DIAGNOSIS — H9190 Unspecified hearing loss, unspecified ear: Secondary | ICD-10-CM | POA: Diagnosis present

## 2015-02-07 DIAGNOSIS — T450X5A Adverse effect of antiallergic and antiemetic drugs, initial encounter: Secondary | ICD-10-CM | POA: Diagnosis present

## 2015-02-07 DIAGNOSIS — R338 Other retention of urine: Secondary | ICD-10-CM | POA: Diagnosis not present

## 2015-02-07 DIAGNOSIS — I340 Nonrheumatic mitral (valve) insufficiency: Secondary | ICD-10-CM | POA: Diagnosis not present

## 2015-02-07 DIAGNOSIS — Z951 Presence of aortocoronary bypass graft: Secondary | ICD-10-CM

## 2015-02-07 DIAGNOSIS — R21 Rash and other nonspecific skin eruption: Secondary | ICD-10-CM | POA: Diagnosis present

## 2015-02-07 DIAGNOSIS — I359 Nonrheumatic aortic valve disorder, unspecified: Secondary | ICD-10-CM | POA: Diagnosis present

## 2015-02-07 DIAGNOSIS — G9340 Encephalopathy, unspecified: Secondary | ICD-10-CM | POA: Diagnosis present

## 2015-02-07 DIAGNOSIS — R109 Unspecified abdominal pain: Secondary | ICD-10-CM

## 2015-02-07 DIAGNOSIS — R296 Repeated falls: Secondary | ICD-10-CM | POA: Diagnosis present

## 2015-02-07 DIAGNOSIS — G4733 Obstructive sleep apnea (adult) (pediatric): Secondary | ICD-10-CM | POA: Diagnosis present

## 2015-02-07 DIAGNOSIS — Z7982 Long term (current) use of aspirin: Secondary | ICD-10-CM

## 2015-02-07 DIAGNOSIS — F419 Anxiety disorder, unspecified: Secondary | ICD-10-CM | POA: Diagnosis present

## 2015-02-07 DIAGNOSIS — R569 Unspecified convulsions: Secondary | ICD-10-CM

## 2015-02-07 DIAGNOSIS — R52 Pain, unspecified: Secondary | ICD-10-CM

## 2015-02-07 DIAGNOSIS — I129 Hypertensive chronic kidney disease with stage 1 through stage 4 chronic kidney disease, or unspecified chronic kidney disease: Secondary | ICD-10-CM | POA: Diagnosis present

## 2015-02-07 DIAGNOSIS — I2510 Atherosclerotic heart disease of native coronary artery without angina pectoris: Secondary | ICD-10-CM | POA: Diagnosis present

## 2015-02-07 HISTORY — DX: Nonrheumatic aortic (valve) stenosis: I35.0

## 2015-02-07 LAB — CBC WITH DIFFERENTIAL/PLATELET
BASOS ABS: 0 10*3/uL (ref 0.0–0.1)
BASOS PCT: 1 % (ref 0–1)
EOS ABS: 0.3 10*3/uL (ref 0.0–0.7)
Eosinophils Relative: 3 % (ref 0–5)
HCT: 45.5 % (ref 39.0–52.0)
HEMOGLOBIN: 15 g/dL (ref 13.0–17.0)
LYMPHS ABS: 1.2 10*3/uL (ref 0.7–4.0)
Lymphocytes Relative: 14 % (ref 12–46)
MCH: 31.3 pg (ref 26.0–34.0)
MCHC: 33 g/dL (ref 30.0–36.0)
MCV: 95 fL (ref 78.0–100.0)
Monocytes Absolute: 0.5 10*3/uL (ref 0.1–1.0)
Monocytes Relative: 6 % (ref 3–12)
NEUTROS PCT: 76 % (ref 43–77)
Neutro Abs: 6.4 10*3/uL (ref 1.7–7.7)
PLATELETS: 192 10*3/uL (ref 150–400)
RBC: 4.79 MIL/uL (ref 4.22–5.81)
RDW: 13.5 % (ref 11.5–15.5)
WBC: 8.4 10*3/uL (ref 4.0–10.5)

## 2015-02-07 LAB — URINALYSIS, ROUTINE W REFLEX MICROSCOPIC
Bilirubin Urine: NEGATIVE
Glucose, UA: NEGATIVE mg/dL
Hgb urine dipstick: NEGATIVE
KETONES UR: NEGATIVE mg/dL
LEUKOCYTES UA: NEGATIVE
Nitrite: NEGATIVE
PROTEIN: NEGATIVE mg/dL
Specific Gravity, Urine: 1.028 (ref 1.005–1.030)
UROBILINOGEN UA: 0.2 mg/dL (ref 0.0–1.0)
pH: 5 (ref 5.0–8.0)

## 2015-02-07 LAB — BASIC METABOLIC PANEL
Anion gap: 6 (ref 5–15)
BUN: 25 mg/dL — ABNORMAL HIGH (ref 6–20)
CHLORIDE: 109 mmol/L (ref 101–111)
CO2: 30 mmol/L (ref 22–32)
Calcium: 9.6 mg/dL (ref 8.9–10.3)
Creatinine, Ser: 1.35 mg/dL — ABNORMAL HIGH (ref 0.61–1.24)
GFR, EST AFRICAN AMERICAN: 49 mL/min — AB (ref >60)
GFR, EST NON AFRICAN AMERICAN: 43 mL/min — AB (ref >60)
Glucose, Bld: 134 mg/dL — ABNORMAL HIGH (ref 65–99)
POTASSIUM: 4.4 mmol/L (ref 3.5–5.1)
SODIUM: 145 mmol/L (ref 135–145)

## 2015-02-07 LAB — HEPATIC FUNCTION PANEL
ALBUMIN: 3.8 g/dL (ref 3.5–5.0)
ALT: 20 U/L (ref 17–63)
AST: 23 U/L (ref 15–41)
Alkaline Phosphatase: 65 U/L (ref 38–126)
BILIRUBIN TOTAL: 0.6 mg/dL (ref 0.3–1.2)
Bilirubin, Direct: 0.1 mg/dL (ref 0.1–0.5)
Indirect Bilirubin: 0.5 mg/dL (ref 0.3–0.9)
Total Protein: 8 g/dL (ref 6.5–8.1)

## 2015-02-07 LAB — BRAIN NATRIURETIC PEPTIDE: B NATRIURETIC PEPTIDE 5: 48.2 pg/mL (ref 0.0–100.0)

## 2015-02-07 LAB — I-STAT TROPONIN, ED: TROPONIN I, POC: 0.01 ng/mL (ref 0.00–0.08)

## 2015-02-07 LAB — TROPONIN I: Troponin I: <0.03 ng/mL (ref <0.031)

## 2015-02-07 LAB — D-DIMER, QUANTITATIVE: D-Dimer, Quant: 3.58 ug/mL-FEU — ABNORMAL HIGH (ref 0.00–0.48)

## 2015-02-07 MED ORDER — SODIUM CHLORIDE 0.9 % IJ SOLN
3.0000 mL | Freq: Two times a day (BID) | INTRAMUSCULAR | Status: DC
Start: 2015-02-07 — End: 2015-02-12
  Administered 2015-02-08 – 2015-02-12 (×6): 3 mL via INTRAVENOUS

## 2015-02-07 MED ORDER — HEPARIN SODIUM (PORCINE) 5000 UNIT/ML IJ SOLN
5000.0000 [IU] | Freq: Three times a day (TID) | INTRAMUSCULAR | Status: DC
  Administered 2015-02-07 – 2015-02-08 (×3): 5000 [IU] via SUBCUTANEOUS
  Filled 2015-02-07 (×6): qty 1

## 2015-02-07 MED ORDER — CEPHALEXIN 500 MG PO CAPS
500.0000 mg | ORAL_CAPSULE | Freq: Three times a day (TID) | ORAL | Status: DC
  Administered 2015-02-07 – 2015-02-08 (×2): 500 mg via ORAL
  Filled 2015-02-07 (×2): qty 1

## 2015-02-07 MED ORDER — ACETAMINOPHEN 500 MG PO TABS
500.0000 mg | ORAL_TABLET | Freq: Four times a day (QID) | ORAL | Status: DC | PRN
  Administered 2015-02-08: 500 mg via ORAL
  Filled 2015-02-07: qty 1

## 2015-02-07 MED ORDER — ASPIRIN EC 325 MG PO TBEC
325.0000 mg | DELAYED_RELEASE_TABLET | Freq: Every day | ORAL | Status: DC
  Administered 2015-02-07 – 2015-02-10 (×4): 325 mg via ORAL
  Filled 2015-02-07 (×4): qty 1

## 2015-02-07 MED ORDER — SODIUM CHLORIDE 0.9 % IV SOLN
INTRAVENOUS | Status: DC
  Administered 2015-02-07: 22:00:00 via INTRAVENOUS

## 2015-02-07 MED ORDER — SODIUM CHLORIDE 0.9 % IV SOLN
500.0000 mg | Freq: Two times a day (BID) | INTRAVENOUS | Status: DC
  Administered 2015-02-07 – 2015-02-09 (×4): 500 mg via INTRAVENOUS
  Filled 2015-02-07 (×5): qty 5

## 2015-02-07 MED ORDER — TRIAMCINOLONE ACETONIDE 0.1 % EX CREA
1.0000 "application " | TOPICAL_CREAM | Freq: Two times a day (BID) | CUTANEOUS | Status: DC
  Administered 2015-02-07 – 2015-02-12 (×9): 1 via TOPICAL
  Filled 2015-02-07 (×2): qty 15

## 2015-02-07 NOTE — Telephone Encounter (Signed)
Pt's family member called in wanting Dr. Everlene Farrier to give them a CB. Larry Bradford has been falling a lot recently and his wife is 81yo. She can't get him up. They are wanting to discuss putting him in a home with Dr. Everlene Farrier. Please advise at [phone]

## 2015-02-07 NOTE — ED Provider Notes (Signed)
CSN: [id number]     Arrival date & time 02/07/15  1549 History   First MD Initiated Contact with Patient 02/07/15 1600     Chief Complaint  Patient presents with  . Fall     (Consider location/radiation/quality/duration/timing/severity/associated sxs/prior Treatment) HPI Comments: Pt is a 81 yo male with PMH of dementia and seizure who presents to the ED via EMS with complaint of fall. Pt's wife reports that the pt has had 3-4 falls per day for the past few days. Denies LOC. She notes that his legs give out on him causing him to fall. Pt reports he hit his head on one of his falls. Wife and daughter also report altered mental status and state that he has been having visual hallucinations at home. Pt endorses weakness to his legs and chronic back pain. Denies fever, headache, SOB, CP, abdominal pain, N/V/D, urinary sxs, blood in urine or stool, numbness, tingling. Wife reports that pt has had a worsening rash to his legs, arms and back. She notes he was seen by a dermatologist a week ago, was dx with dermatitis and was given Triamcinolone cream. Daughter and wife report the pt continues to scratch the rash. Wife states the pt has been taking his seizure medication as prescribed and is not on oxygen at home.   Patient is a 81 y.o. male presenting with fall.  Fall Associated symptoms include a rash and weakness.    Past Medical History  Diagnosis Date  . Seizures     Onset in Sept 2006  . Cataract   . Hard of hearing   . Renal disorder   . Depression   . HTN (hypertension)   . BPH (benign prostatic hyperplasia)   . CAD (coronary artery disease)     s/p CABG in 2002  . Hyperlipidemia   . OSA (obstructive sleep apnea)   . Aortic aneurysm     3 CM in 2007  . Carotid stenosis   . DDD (degenerative disc disease)   . Trigeminal neuralgia   . Lower GI bleed   . Stroke   . Allergy   . CHF (congestive heart failure)   . History of heart bypass surgery    Past Surgical History  Procedure  Laterality Date  . Coronary artery bypass graft  2002  . Temporal artery biopsy / ligation  2004   Family History  Problem Relation Age of Onset  . CAD Brother   . Heart disease Brother   . Cancer Mother    Social History  Substance Use Topics  . Smoking status: Never Smoker   . Smokeless tobacco: Never Used  . Alcohol Use: No    Review of Systems  Musculoskeletal: Positive for back pain.  Skin: Positive for rash.  Neurological: Positive for weakness.  Psychiatric/Behavioral: Positive for hallucinations.  All other systems reviewed and are negative.     Allergies  Review of patient's allergies indicates no known allergies.  Home Medications   Prior to Admission medications   Medication Sig Start Date End Date Taking? Authorizing Provider  acetaminophen (TYLENOL) 500 MG tablet Take 500 mg by mouth every 6 (six) hours as needed for moderate pain.   Yes Historical Provider, MD  aspirin 325 MG EC tablet Take 325 mg by mouth every 6 (six) hours as needed for pain.   Yes Historical Provider, MD  diphenhydrAMINE (BENADRYL) 25 MG tablet Take 25 mg by mouth every 6 (six) hours as needed for itching.   Yes Historical  Provider, MD  diphenhydrAMINE-zinc acetate (BENADRYL) cream Apply 1 application topically 3 (three) times daily as needed for itching.   Yes Historical Provider, MD  cephALEXin (KEFLEX) 500 MG capsule Take 500 mg by mouth 3 (three) times daily. 01/14/15   Historical Provider, MD  levETIRAcetam (KEPPRA) 500 MG tablet Take 1 tablet (500 mg total) by mouth every 12 (twelve) hours. 01/21/15   Comer Locket, PA-C  LORazepam (ATIVAN) 1 MG tablet Take 1 tablet (1 mg total) by mouth 3 (three) times daily as needed for anxiety. Patient not taking: Reported on 01/24/2015 12/28/13   Alfonzo Beers, MD  polyvinyl alcohol (LIQUIFILM TEARS) 1.4 % ophthalmic solution Place 1 drop into both eyes 4 (four) times daily. Patient not taking: Reported on 01/24/2015 01/11/13   Thurnell Lose, MD   triamcinolone cream (KENALOG) 0.1 % Apply 1 application topically 2 (two) times daily. Patient not taking: Reported on 01/24/2015 12/28/13   Alfonzo Beers, MD   BP 140/85 mmHg  Pulse 110  Temp(Src) 98.1 F (36.7 C) (Oral)  Resp 22  SpO2 100% Physical Exam  Constitutional: He is oriented to person, place, and time. He appears well-developed and well-nourished. No distress.  Elderly appearing male who is hard of hearing.   HENT:  Head: Normocephalic and atraumatic. Head is without raccoon's eyes, without Battle's sign, without abrasion, without contusion and without laceration.  Right Ear: External ear normal. No hemotympanum.  Left Ear: External ear normal. No hemotympanum.  Nose: Nose normal. No nasal septal hematoma.  Mouth/Throat: Uvula is midline, oropharynx is clear and moist and mucous membranes are normal. No oropharyngeal exudate.  Eyes: Conjunctivae and EOM are normal. Pupils are equal, round, and reactive to light. Right eye exhibits no discharge. Left eye exhibits no discharge. No scleral icterus.  Neck: Normal range of motion. Neck supple.  No midline c-spine tenderness.   Cardiovascular: Regular rhythm, normal heart sounds and intact distal pulses.   tachycardic  Pulmonary/Chest: Effort normal and breath sounds normal. He has no wheezes. He has no rales. He exhibits no tenderness.  Abdominal: Soft. Bowel sounds are normal. He exhibits no distension and no mass. There is no tenderness. There is no rebound and no guarding.  Musculoskeletal: Normal range of motion. He exhibits no edema or tenderness.  No C/T/L midline TTP.  Lymphadenopathy:    He has no cervical adenopathy.  Neurological: He is alert and oriented to person, place, and time. He has normal strength. No cranial nerve deficit or sensory deficit. He displays a negative Romberg sign. Coordination normal.  Pt alert to person, place and time. 5/5 strength in lower extremities.   Skin: Skin is warm and dry. Rash noted.   Erythematous raised lesions noted on BLE, BUE and back, excoriations present, confluence of lesions present on bilateral shins.   Nursing note and vitals reviewed.   ED Course  Procedures (including critical care time) Labs Review Labs Reviewed  BASIC METABOLIC PANEL - Abnormal; Notable for the following:    Glucose, Bld 134 (*)    BUN 25 (*)    Creatinine, Ser 1.35 (*)    GFR calc non Af Amer 43 (*)    GFR calc Af Amer 49 (*)    All other components within normal limits  CBC WITH DIFFERENTIAL/PLATELET  URINALYSIS, ROUTINE W REFLEX MICROSCOPIC (NOT AT [facility])  HEPATIC FUNCTION PANEL  D-DIMER, QUANTITATIVE (NOT AT [facility])  Randolm Idol, ED    Imaging Review Dg Chest 2 View  02/07/2015   CLINICAL DATA:  Change in mental status, multiple falls  EXAM: CHEST  2 VIEW  COMPARISON:  01/24/2015 chest radiograph and thoracic spine radiographs  FINDINGS: Evidence of CABG. Mild enlargement of the cardiomediastinal silhouette is noted. Lungs are hypoaerated with crowding of the bronchovascular markings. No new focal pulmonary opacity allowing for hypoaeration. No pleural effusions. Multiple lower thoracic central endplate depression deformities are reidentified, not significantly changed.  IMPRESSION: Low volume exam without focal acute finding.   Electronically Signed   By: Conchita Paris M.D.   On: 02/07/2015 18:07   Ct Head Wo Contrast  02/07/2015   CLINICAL DATA:  Fall.  EXAM: CT HEAD WITHOUT CONTRAST  CT CERVICAL SPINE WITHOUT CONTRAST  TECHNIQUE: Multidetector CT imaging of the head and cervical spine was performed following the standard protocol without intravenous contrast. Multiplanar CT image reconstructions of the cervical spine were also generated.  COMPARISON:  CT scan of cervical spine of January 24, 2015; CT scan of head of December 20, 2012.  FINDINGS: CT HEAD FINDINGS  Bony calvarium appears intact. Mild diffuse cortical atrophy is noted. Mild chronic ischemic white matter disease is  noted. No mass effect or midline shift is noted. Ventricular size is within normal limits. There is no evidence of mass lesion, hemorrhage or acute infarction.  CT CERVICAL SPINE FINDINGS  No fracture is noted. Moderate degenerative disc disease is noted at C3-4 and C4-5, with severe degenerative disc disease seen at C5-6, C6-7 C7-T1. Grade 1 anterolisthesis is noted at C3-4, C4-5 and C5-6 secondary to posterior facet joint hypertrophy. Visualized lung apices appear normal.  IMPRESSION: Mild diffuse cortical atrophy. Mild chronic ischemic white matter disease. No acute intracranial abnormality seen.  Moderate to severe multilevel degenerative disc disease is noted in the cervical spine. No acute abnormality is noted.   Electronically Signed   By: Marijo Conception, M.D.   On: 02/07/2015 17:48   Ct Cervical Spine Wo Contrast  02/07/2015   CLINICAL DATA:  Fall.  EXAM: CT HEAD WITHOUT CONTRAST  CT CERVICAL SPINE WITHOUT CONTRAST  TECHNIQUE: Multidetector CT imaging of the head and cervical spine was performed following the standard protocol without intravenous contrast. Multiplanar CT image reconstructions of the cervical spine were also generated.  COMPARISON:  CT scan of cervical spine of January 24, 2015; CT scan of head of December 20, 2012.  FINDINGS: CT HEAD FINDINGS  Bony calvarium appears intact. Mild diffuse cortical atrophy is noted. Mild chronic ischemic white matter disease is noted. No mass effect or midline shift is noted. Ventricular size is within normal limits. There is no evidence of mass lesion, hemorrhage or acute infarction.  CT CERVICAL SPINE FINDINGS  No fracture is noted. Moderate degenerative disc disease is noted at C3-4 and C4-5, with severe degenerative disc disease seen at C5-6, C6-7 C7-T1. Grade 1 anterolisthesis is noted at C3-4, C4-5 and C5-6 secondary to posterior facet joint hypertrophy. Visualized lung apices appear normal.  IMPRESSION: Mild diffuse cortical atrophy. Mild chronic ischemic  white matter disease. No acute intracranial abnormality seen.  Moderate to severe multilevel degenerative disc disease is noted in the cervical spine. No acute abnormality is noted.   Electronically Signed   By: Marijo Conception, M.D.   On: 02/07/2015 17:48   I have personally reviewed and evaluated these images and lab results as part of my medical decision-making.   EKG Interpretation   Date/Time:  Friday February 07 2015 16:01:36 EDT Ventricular Rate:  128 PR Interval:  98 QRS Duration: 127 QT  Interval:  334 QTC Calculation: 487 R Axis:   73 Text Interpretation:  Ectopic atrial tachycardia, unifocal Right bundle  branch block Abnormal ekg Confirmed by BEATON  MD, ROBERT ([id number]) on  02/07/2015 4:08:11 PM      MDM   Final diagnoses:  Altered mental status, unspecified altered mental status type  Tachycardia    Pt presents s/p fall. Family report AMS, visual hallucinations and weakness along with multiple falls per day. VSS with exception of tachycardia. Pt alert to person place and time. No neuro deficits.  UA, CBC, BMP, LFTs unremarkable. Troponin negative. EKG showed ectopic atrial tachycardia and RBBB. CXR negative. CT head and neck mild diffuse cortical atrophy, mild chronic ischemic white matter disease. No acute intracranial abnormality seen, moderate to severe multilevel degenerative disc disease is noted in the cervical spine, no acute abnormality is noted. Consulted hospitalist. Plan to admit pt for further workup of AMS, weakness and tachycardia, orders placed for telemetry bed.     Chesley Noon [place], [place] 02/07/15 2022  Leonard Schwartz, MD 02/08/15 1300

## 2015-02-07 NOTE — Telephone Encounter (Signed)
The cell [phone]

## 2015-02-07 NOTE — Telephone Encounter (Signed)
Pt's family member called in saying Larry Bradford was not making any sense when he's trying to talk. He can't walk right now. They are now [place] call EMS. Please notify Dr. Everlene Farrier and [facility]

## 2015-02-07 NOTE — ED Notes (Addendum)
Patient is from home and family called today due to the patient having multiple falls over the past few days and a change in mental status. Patient does have history of dementia. Patient has chronic pain and complains of it, but nothing new onset. Patient has rash to arms and legs that is being treated by his primary with cream.

## 2015-02-07 NOTE — H&P (Addendum)
[facility] History and Physical  Larry Bradford UXN:[id number] DOB: February 15, 1917 DOA: 02/07/2015  Referring physician: ED physician PCP: Merrilee Seashore, MD  Specialists:   Chief Complaint: fall, AMS and rashes.  HPI: Larry Bradford is a 81 y.o. male with PMH of seizure, hypertension, hyperlipidemia, CAD, s/p CABG, CKD-III, BPH, OSA, aortic aneurysm, cardiac artery stenosis, DDD, lower GI bleeding, stroke, diastolic congestive heart failure (EF 62 mL 5% with grade 2 diastolic dysfunction), depression, anxiety, rashes in extremities, who presents with fall, AMS and rashes.  Patient has AMS, and is unable to provide accurate medical history, therefore, most of the history is obtained by discussing the case with ED physician, his son and his wife. Pt's wife reports that the pt has had 3-4 falls per day for the past few days. Patient has weakness in both legs and chronic back pain. She notes that his legs give out on him causing him to fall. No LOC. Pt reports he hit his head on one of his falls. Wife and daughter also report pt has been having visual hallucinations at home. Per family, pt probably is not taking his keppra consistently for seizure, but they did not notice seizure episode.  Patient does not have chest pain, shortness breath, cough, abdominal pain, diarrhea, nausea, vomiting, symptoms of UTI. Wife reports that pt has had worsening rash to his legs, arms and back. He was seen by a dermatologist a week ago, was dx with dermatitis and was givenTriamcinolone cream. He is also on Benadryl and Keflex which were prescribed by his PCP per his wife.  In ED, patient was found to have oxygen desaturation to 87% which improved to 100% on nasal cannula, tachycardia, positive D dimer, negative troponin, negative urinalysis, stable renal function, WBC 8.4, temperature normal. CT head and C-spine are negative for acute intracranial abnormalities.  Where does patient live?   At home   Can  patient participate in ADLs?  Barely   Review of Systems: Could not be obtained due to altered mental status.  Allergy: No Known Allergies  Past Medical History  Diagnosis Date  . Seizures     Onset in Sept 2006  . Cataract   . Hard of hearing   . Renal disorder   . Depression   . HTN (hypertension)   . BPH (benign prostatic hyperplasia)   . CAD (coronary artery disease)     s/p CABG in 2002  . Hyperlipidemia   . OSA (obstructive sleep apnea)   . Aortic aneurysm     3 CM in 2007  . Carotid stenosis   . DDD (degenerative disc disease)   . Trigeminal neuralgia   . Lower GI bleed   . Stroke   . Allergy   . CHF (congestive heart failure)   . History of heart bypass surgery     Past Surgical History  Procedure Laterality Date  . Coronary artery bypass graft  2002  . Temporal artery biopsy / ligation  2004    Social History:  reports that he has never smoked. He has never used smokeless tobacco. He reports that he does not drink alcohol or use illicit drugs.  Family History:  Family History  Problem Relation Age of Onset  . CAD Brother   . Heart disease Brother   . Cancer Mother      Prior to Admission medications   Medication Sig Start Date End Date Taking? Authorizing Provider  acetaminophen (TYLENOL) 500 MG tablet Take 500 mg by mouth every  6 (six) hours as needed for moderate pain.   Yes Historical Provider, MD  aspirin 325 MG EC tablet Take 325 mg by mouth every 6 (six) hours as needed for pain.   Yes Historical Provider, MD  diphenhydrAMINE (BENADRYL) 25 MG tablet Take 25 mg by mouth every 6 (six) hours as needed for itching.   Yes Historical Provider, MD  diphenhydrAMINE-zinc acetate (BENADRYL) cream Apply 1 application topically 3 (three) times daily as needed for itching.   Yes Historical Provider, MD  cephALEXin (KEFLEX) 500 MG capsule Take 500 mg by mouth 3 (three) times daily. 01/14/15   Historical Provider, MD  levETIRAcetam (KEPPRA) 500 MG tablet Take 1  tablet (500 mg total) by mouth every 12 (twelve) hours. 01/21/15   Comer Locket, PA-C  LORazepam (ATIVAN) 1 MG tablet Take 1 tablet (1 mg total) by mouth 3 (three) times daily as needed for anxiety. Patient not taking: Reported on 01/24/2015 12/28/13   Alfonzo Beers, MD  polyvinyl alcohol (LIQUIFILM TEARS) 1.4 % ophthalmic solution Place 1 drop into both eyes 4 (four) times daily. Patient not taking: Reported on 01/24/2015 01/11/13   Thurnell Lose, MD  triamcinolone cream (KENALOG) 0.1 % Apply 1 application topically 2 (two) times daily. Patient not taking: Reported on 01/24/2015 12/28/13   Alfonzo Beers, MD    Physical Exam: Filed Vitals:   02/07/15 2000 02/07/15 2030 02/07/15 2100 02/07/15 2230  BP: 150/87 162/90 146/89 155/83  Pulse: 121 119  119  Temp:      TempSrc:      Resp: 19 18 22 18   SpO2: 100% 100%  98%   General: Not in acute distress HEENT:       Eyes: PERRL, EOMI, no scleral icterus.       ENT: No discharge from the ears and nose, no pharynx injection, no tonsillar enlargement.        Neck: No JVD, no bruit, no mass felt. Heme: No neck lymph node enlargement. Cardiac: S1/S2, RRR, No murmurs, No gallops or rubs. Pulm:  No rales, wheezing, rhonchi or rubs. Abd: Soft, nondistended, nontender, no rebound pain, no organomegaly, BS present. Ext: No pitting leg edema bilaterally. 2+DP/PT pulse bilaterally. Musculoskeletal: No joint deformities, No joint redness or warmth, no limitation of ROM in spin. Skin: has rashes over both arms, legs and back. Neuro: Alert, confused, not oriented X3, cranial nerves II-XII grossly intact, muscle strength 5/5 in all extremities, sensation to light touch intact. Brachial reflex 1+ bilaterally. Knee reflex 1+ bilaterally. Negative Babinski's sign. Psych: Patient is not psychotic, no suicidal or hemocidal ideation.  Labs on Admission:  Basic Metabolic Panel:  Recent Labs Lab 02/07/15 1748  NA 145  K 4.4  CL 109  CO2 30  GLUCOSE 134*   BUN 25*  CREATININE 1.35*  CALCIUM 9.6   Liver Function Tests:  Recent Labs Lab 02/07/15 1748  AST 23  ALT 20  ALKPHOS 65  BILITOT 0.6  PROT 8.0  ALBUMIN 3.8   No results for input(s): LIPASE, AMYLASE in the last 168 hours. No results for input(s): AMMONIA in the last 168 hours. CBC:  Recent Labs Lab 02/07/15 1748  WBC 8.4  NEUTROABS 6.4  HGB 15.0  HCT 45.5  MCV 95.0  PLT 192   Cardiac Enzymes:  Recent Labs Lab 02/07/15 2154  TROPONINI <0.03    BNP (last 3 results)  Recent Labs  02/07/15 2154  BNP 48.2    ProBNP (last 3 results) No results for input(s): PROBNP  in the last 8760 hours.  CBG: No results for input(s): GLUCAP in the last 168 hours.  Radiological Exams on Admission: Dg Chest 2 View  02/07/2015   CLINICAL DATA:  Change in mental status, multiple falls  EXAM: CHEST  2 VIEW  COMPARISON:  01/24/2015 chest radiograph and thoracic spine radiographs  FINDINGS: Evidence of CABG. Mild enlargement of the cardiomediastinal silhouette is noted. Lungs are hypoaerated with crowding of the bronchovascular markings. No new focal pulmonary opacity allowing for hypoaeration. No pleural effusions. Multiple lower thoracic central endplate depression deformities are reidentified, not significantly changed.  IMPRESSION: Low volume exam without focal acute finding.   Electronically Signed   By: Conchita Paris M.D.   On: 02/07/2015 18:07   Ct Head Wo Contrast  02/07/2015   CLINICAL DATA:  Fall.  EXAM: CT HEAD WITHOUT CONTRAST  CT CERVICAL SPINE WITHOUT CONTRAST  TECHNIQUE: Multidetector CT imaging of the head and cervical spine was performed following the standard protocol without intravenous contrast. Multiplanar CT image reconstructions of the cervical spine were also generated.  COMPARISON:  CT scan of cervical spine of January 24, 2015; CT scan of head of December 20, 2012.  FINDINGS: CT HEAD FINDINGS  Bony calvarium appears intact. Mild diffuse cortical atrophy is noted.  Mild chronic ischemic white matter disease is noted. No mass effect or midline shift is noted. Ventricular size is within normal limits. There is no evidence of mass lesion, hemorrhage or acute infarction.  CT CERVICAL SPINE FINDINGS  No fracture is noted. Moderate degenerative disc disease is noted at C3-4 and C4-5, with severe degenerative disc disease seen at C5-6, C6-7 C7-T1. Grade 1 anterolisthesis is noted at C3-4, C4-5 and C5-6 secondary to posterior facet joint hypertrophy. Visualized lung apices appear normal.  IMPRESSION: Mild diffuse cortical atrophy. Mild chronic ischemic white matter disease. No acute intracranial abnormality seen.  Moderate to severe multilevel degenerative disc disease is noted in the cervical spine. No acute abnormality is noted.   Electronically Signed   By: Marijo Conception, M.D.   On: 02/07/2015 17:48   Ct Cervical Spine Wo Contrast  02/07/2015   CLINICAL DATA:  Fall.  EXAM: CT HEAD WITHOUT CONTRAST  CT CERVICAL SPINE WITHOUT CONTRAST  TECHNIQUE: Multidetector CT imaging of the head and cervical spine was performed following the standard protocol without intravenous contrast. Multiplanar CT image reconstructions of the cervical spine were also generated.  COMPARISON:  CT scan of cervical spine of January 24, 2015; CT scan of head of December 20, 2012.  FINDINGS: CT HEAD FINDINGS  Bony calvarium appears intact. Mild diffuse cortical atrophy is noted. Mild chronic ischemic white matter disease is noted. No mass effect or midline shift is noted. Ventricular size is within normal limits. There is no evidence of mass lesion, hemorrhage or acute infarction.  CT CERVICAL SPINE FINDINGS  No fracture is noted. Moderate degenerative disc disease is noted at C3-4 and C4-5, with severe degenerative disc disease seen at C5-6, C6-7 C7-T1. Grade 1 anterolisthesis is noted at C3-4, C4-5 and C5-6 secondary to posterior facet joint hypertrophy. Visualized lung apices appear normal.  IMPRESSION: Mild  diffuse cortical atrophy. Mild chronic ischemic white matter disease. No acute intracranial abnormality seen.  Moderate to severe multilevel degenerative disc disease is noted in the cervical spine. No acute abnormality is noted.   Electronically Signed   By: Marijo Conception, M.D.   On: 02/07/2015 17:48    EKG: Independently reviewed.  Abnormal findings:  QTc 487,  has R bundle blockage which is old.  Assessment/Plan Principal Problem:   Fall Active Problems:   Hypertension   Seizures   CKD (chronic kidney disease), stage III   BPH (benign prostatic hyperplasia)   CAD (coronary artery disease)   Hyperlipidemia   Carotid stenosis   Aortic valve disorder   History of stroke   Tachycardia   Rash   Acute encephalopathy  Addendum 6:35 AM: I was called by RN, and was told that patient has worsening mental status and is combative. I evaluated pt.  He moves all extremities. His oxygen saturation is 93% on RA. Patient's family refused both CT angiogram of chest and V/q scan for r/o PE. Pt has no fever and leukocytosis, infection seems unlikely the etiology.  - Consulted to Neurology. Will transfer pt to [facility] per Dr. Janann Colonel.  -will get stat ABG   Fall: Etiology is not clear. Differential diagnosis includes stroke giving history of stroke and leg weakness, pulmonary embolism given tachycardia and oxygen desaturation, seizure giving patient may have not being taking Keppra consistently and ACS. He has CKD-III with GFR 43 and cre 1.35 today. I discussed with his son about doing a CTA to r/o PE, and explained this may make his renal Fx worse. Family refused CTA-chest.  -will admit to SDU -check orthostatic signs -check EEG -MRI-brain -V/Q scan -trop x 3 -2d echo -NPO -IVF: NS 75 cc/h - Pt/ot  Acute encephalopathy: Etiology is not clear. Differential diagnoses include medication side effects such as Benadryl which has anti-cholinergic effect, hypoxia, stroke and ACS -Will hold  Benadryl -Workup for stroke, ACS and PE as above -Frequent neuro check -check UDS  Seizures: -check keppra level -switch oral to IV keppra -Continue neuro check -Seizure precaution  Hypertension: not on meds at home. bpis 113/78 -observe bp closely  CKD (chronic kidney disease), stage III: stable. Baseline creatinine 1.2- 1.4, his creatinine 1.35, which is at baseline. -Follow-up renal function and BMP  BPH (benign prostatic hyperplasia): Not on med -prn foley if develops urinary retention  CAD (coronary artery disease): denies chest pain.  -work up as above -continue ASA  HLD: Last LDL was not on record. Not taking meds at home -Check FLP  History of stroke: -ASA  Rash: pt was seen by PCP and dermatologist. -continue Keflex -Triamcinolone cream. -hold Benadryl given altered mental status -check RMSF titers  DVT ppx: SQ Heparin    Code Status: Full code Family Communication: None at bed side.  Disposition Plan: Admit to inpatient   Date of Service 02/07/2015    Ivor Costa [facility] Pager [phone]  If 7PM-7AM, please contact night-coverage [email or web address] Password [email or web address] 02/07/2015, 11:13 PM

## 2015-02-08 ENCOUNTER — Encounter (HOSPITAL_COMMUNITY): Payer: Self-pay | Admitting: *Deleted

## 2015-02-08 ENCOUNTER — Inpatient Hospital Stay (HOSPITAL_COMMUNITY): Payer: Medicare Other

## 2015-02-08 ENCOUNTER — Inpatient Hospital Stay (HOSPITAL_COMMUNITY)
Admit: 2015-02-08 | Discharge: 2015-02-08 | Disposition: A | Discharge status: Home / Self Care | Payer: Medicare Other | Attending: Family Medicine | Admitting: Family Medicine

## 2015-02-08 DIAGNOSIS — R4182 Altered mental status, unspecified: Secondary | ICD-10-CM | POA: Diagnosis present

## 2015-02-08 DIAGNOSIS — I82409 Acute embolism and thrombosis of unspecified deep veins of unspecified lower extremity: Secondary | ICD-10-CM

## 2015-02-08 LAB — BLOOD GAS, ARTERIAL
Acid-base deficit: 0.5 mmol/L (ref 0.0–2.0)
Bicarbonate: 23.1 mEq/L (ref 20.0–24.0)
DRAWN BY: 308601
FIO2: 0.21
O2 Saturation: 97 %
PH ART: 7.415 (ref 7.350–7.450)
Patient temperature: 98.6
TCO2: 20.4 mmol/L (ref 0–100)
pCO2 arterial: 36.8 mmHg (ref 35.0–45.0)
pO2, Arterial: 91.2 mmHg (ref 80.0–100.0)

## 2015-02-08 LAB — BASIC METABOLIC PANEL
Anion gap: 11 (ref 5–15)
BUN: 22 mg/dL — AB (ref 6–20)
CALCIUM: 9 mg/dL (ref 8.9–10.3)
CHLORIDE: 110 mmol/L (ref 101–111)
CO2: 20 mmol/L — AB (ref 22–32)
CREATININE: 1.14 mg/dL (ref 0.61–1.24)
GFR calc non Af Amer: 52 mL/min — ABNORMAL LOW (ref >60)
Glucose, Bld: 93 mg/dL (ref 65–99)
Potassium: 4.6 mmol/L (ref 3.5–5.1)
SODIUM: 141 mmol/L (ref 135–145)

## 2015-02-08 LAB — CBC
HCT: 46.2 % (ref 39.0–52.0)
HEMATOCRIT: 45.9 % (ref 39.0–52.0)
HEMOGLOBIN: 15.2 g/dL (ref 13.0–17.0)
Hemoglobin: 15.5 g/dL (ref 13.0–17.0)
MCH: 32.2 pg (ref 26.0–34.0)
MCH: 31.1 pg (ref 26.0–34.0)
MCHC: 33.5 g/dL (ref 30.0–36.0)
MCHC: 33.1 g/dL (ref 30.0–36.0)
MCV: 96 fL (ref 78.0–100.0)
MCV: 93.9 fL (ref 78.0–100.0)
PLATELETS: 170 10*3/uL (ref 150–400)
Platelets: 212 10*3/uL (ref 150–400)
RBC: 4.81 MIL/uL (ref 4.22–5.81)
RBC: 4.89 MIL/uL (ref 4.22–5.81)
RDW: 13.5 % (ref 11.5–15.5)
RDW: 13.4 % (ref 11.5–15.5)
WBC: 7.3 10*3/uL (ref 4.0–10.5)
WBC: 7.3 10*3/uL (ref 4.0–10.5)

## 2015-02-08 LAB — COMPREHENSIVE METABOLIC PANEL
ALBUMIN: 3.6 g/dL (ref 3.5–5.0)
ALK PHOS: 72 U/L (ref 38–126)
ALT: 21 U/L (ref 17–63)
AST: 23 U/L (ref 15–41)
Anion gap: 9 (ref 5–15)
BILIRUBIN TOTAL: 1.4 mg/dL — AB (ref 0.3–1.2)
BUN: 18 mg/dL (ref 6–20)
CALCIUM: 9.3 mg/dL (ref 8.9–10.3)
CO2: 27 mmol/L (ref 22–32)
Chloride: 104 mmol/L (ref 101–111)
Creatinine, Ser: 1.23 mg/dL (ref 0.61–1.24)
GFR calc Af Amer: 55 mL/min — ABNORMAL LOW (ref >60)
GFR calc non Af Amer: 48 mL/min — ABNORMAL LOW (ref >60)
GLUCOSE: 109 mg/dL — AB (ref 65–99)
POTASSIUM: 4.1 mmol/L (ref 3.5–5.1)
Sodium: 140 mmol/L (ref 135–145)
TOTAL PROTEIN: 7.7 g/dL (ref 6.5–8.1)

## 2015-02-08 LAB — LIPID PANEL
Cholesterol: 224 mg/dL — ABNORMAL HIGH (ref 0–200)
HDL: 39 mg/dL — AB (ref >40)
LDL CALC: 149 mg/dL — AB (ref 0–99)
TRIGLYCERIDES: 179 mg/dL — AB (ref <150)
Total CHOL/HDL Ratio: 5.7 RATIO
VLDL: 36 mg/dL (ref 0–40)

## 2015-02-08 LAB — TROPONIN I
Troponin I: <0.03 ng/mL (ref <0.031)
Troponin I: <0.03 ng/mL (ref <0.031)

## 2015-02-08 LAB — LACTIC ACID, PLASMA: Lactic Acid, Venous: 1.3 mmol/L (ref 0.5–2.0)

## 2015-02-08 LAB — CBG MONITORING, ED: GLUCOSE-CAPILLARY: 102 mg/dL — AB (ref 65–99)

## 2015-02-08 LAB — LIPASE, BLOOD: Lipase: 39 U/L (ref 22–51)

## 2015-02-08 MED ORDER — TAMSULOSIN HCL 0.4 MG PO CAPS
0.4000 mg | ORAL_CAPSULE | Freq: Every day | ORAL | Status: DC
  Administered 2015-02-09 – 2015-02-11 (×3): 0.4 mg via ORAL
  Filled 2015-02-08 (×3): qty 1

## 2015-02-08 MED ORDER — CEFAZOLIN SODIUM 1-5 GM-% IV SOLN
1.0000 g | Freq: Three times a day (TID) | INTRAVENOUS | Status: DC
  Administered 2015-02-08 – 2015-02-09 (×3): 1 g via INTRAVENOUS
  Filled 2015-02-08 (×7): qty 50

## 2015-02-08 MED ORDER — ONDANSETRON HCL 4 MG/2ML IJ SOLN: 4.0000 mg | Freq: Four times a day (QID) | INTRAMUSCULAR | Status: DC | PRN

## 2015-02-08 MED ORDER — HEPARIN SODIUM (PORCINE) 5000 UNIT/ML IJ SOLN
5000.0000 [IU] | Freq: Three times a day (TID) | INTRAMUSCULAR | Status: DC
  Administered 2015-02-09 – 2015-02-10 (×4): 5000 [IU] via SUBCUTANEOUS
  Filled 2015-02-08 (×4): qty 1

## 2015-02-08 MED ORDER — ENOXAPARIN SODIUM 80 MG/0.8ML ~~LOC~~ SOLN: 75.0000 mg | Freq: Two times a day (BID) | SUBCUTANEOUS | Status: DC

## 2015-02-08 MED ORDER — PANTOPRAZOLE SODIUM 40 MG IV SOLR
40.0000 mg | INTRAVENOUS | Status: DC
  Administered 2015-02-09: 40 mg via INTRAVENOUS
  Filled 2015-02-08 (×2): qty 40

## 2015-02-08 MED ORDER — MORPHINE SULFATE (PF) 2 MG/ML IV SOLN: 0.5000 mg | INTRAVENOUS | Status: DC | PRN

## 2015-02-08 MED ORDER — ENOXAPARIN SODIUM 40 MG/0.4ML ~~LOC~~ SOLN: 40.0000 mg | SUBCUTANEOUS | Status: DC

## 2015-02-08 NOTE — Progress Notes (Signed)
*  Preliminary Results* Bilateral lower extremity venous duplex completed. Study was technically difficult due to poor patient cooperation and patient's contracted state. Visualized veins of bilateral lower extremities are negative for deep vein thrombosis. There is no evidence of Baker's cyst bilaterally.  02/08/2015  Maudry Mayhew, RVT, RDCS, RDMS

## 2015-02-08 NOTE — Consult Note (Signed)
Consult Reason for Consult:altered mental status Referring Physician: Dr Blaine Hamper  CC: altered mental status  HPI: Larry Bradford is an 81 y.o. male hx of seizures, CVA, HTN, CAD, HLD admitted with altered mental status. His wife reports that he has had multiple falls over the past few days. Notes weakness in both legs and they have given out on him causing him to fall. He did hit his head on one of the falls. Family also notes he has been having visual hallucinations over the past few days.   He was seen by a dermatologist around 1 week ago for a rash and was started on triamcinolone. Around that time his PCP also started him on Keflex and benadryl. In ED noted to have oxygen saturation of 87%, improved to 100% on nasal canula. Had tachycardia with positive D-dimer, negative troponin, negative UA. Afebrile with WBC 8.4. CT head imaging reviewed and overall unremarkable. On keppra at home for seizures but family unsure if he is taking it.   Past Medical History  Diagnosis Date  . Seizures     Onset in Sept 2006  . Cataract   . Hard of hearing   . Renal disorder   . Depression   . HTN (hypertension)   . BPH (benign prostatic hyperplasia)   . CAD (coronary artery disease)     s/p CABG in 2002  . Hyperlipidemia   . OSA (obstructive sleep apnea)   . Aortic aneurysm     3 CM in 2007  . Carotid stenosis   . DDD (degenerative disc disease)   . Trigeminal neuralgia   . Lower GI bleed   . Stroke   . Allergy   . CHF (congestive heart failure)   . History of heart bypass surgery     Past Surgical History  Procedure Laterality Date  . Coronary artery bypass graft  2002  . Temporal artery biopsy / ligation  2004    Family History  Problem Relation Age of Onset  . CAD Brother   . Heart disease Brother   . Cancer Mother     Social History:  reports that he has never smoked. He has never used smokeless tobacco. He reports that he does not drink alcohol or use illicit drugs.  No Known  Allergies  Medications:  Scheduled: . aspirin  325 mg Oral Daily  . cephALEXin  500 mg Oral TID  . heparin  5,000 Units Subcutaneous 3 times per day  . levETIRAcetam  500 mg Intravenous Q12H  . sodium chloride  3 mL Intravenous Q12H  . triamcinolone cream  1 application Topical BID     ROS: Out of a complete 14 system review, the patient complains of only the following symptoms, and all other reviewed systems are negative. + confusion  Physical Examination: Filed Vitals:   02/08/15 1111  BP: 148/76  Pulse: 106  Temp:   Resp: 14   Physical Exam  Constitutional: He appears well-developed and well-nourished.  Psych: Affect appropriate to situation Eyes: No scleral injection HENT: No OP obstrucion Head: Normocephalic.  Cardiovascular: Normal rate and regular rhythm.  Respiratory: Effort normal and breath sounds normal.  GI: Soft. Bowel sounds are normal. No distension. There is no tenderness.  Skin: WDI  Neurologic Examination Mental Status: Alert, oriented to name, hospital, Sept 2016, slightly tangential thought process. No signs of aphasia. Mild to moderate dysarthria. Able to follow 3 step commands without difficulty. Cranial Nerves: II: optic discs not visualized, pupils equal, round,  reactive to light  III,IV, VI: ptosis not present, extra-ocular motions intact bilaterally V,VII: smile symmetric, facial light touch sensation normal bilaterally VIII: hearing normal bilaterally IX,X: gag reflex present XI: trapezius strength/neck flexion strength normal bilaterally XII: tongue strength normal  Motor: Patient notes generalized pain and does not wish to move but appears to move all extremities symmetrically and against light  Sensory: Pinprick and light touch intact throughout, bilaterally Deep Tendon Reflexes: 1+ and symmetric throughout, absent AJs bilaterally Plantars: Right: downgoing   Left: downgoing Cerebellar: normal finger-to-nose, and normal  heel-to-shin test Gait: deferred  Laboratory Studies:   Basic Metabolic Panel:  Recent Labs Lab 02/07/15 1748 02/08/15 0614  NA 145 141  K 4.4 4.6  CL 109 110  CO2 30 20*  GLUCOSE 134* 93  BUN 25* 22*  CREATININE 1.35* 1.14  CALCIUM 9.6 9.0    Liver Function Tests:  Recent Labs Lab 02/07/15 1748  AST 23  ALT 20  ALKPHOS 65  BILITOT 0.6  PROT 8.0  ALBUMIN 3.8   No results for input(s): LIPASE, AMYLASE in the last 168 hours. No results for input(s): AMMONIA in the last 168 hours.  CBC:  Recent Labs Lab 02/07/15 1748 02/08/15 0614  WBC 8.4 7.3  NEUTROABS 6.4  --   HGB 15.0 15.5  HCT 45.5 46.2  MCV 95.0 96.0  PLT 192 170    Cardiac Enzymes:  Recent Labs Lab 02/07/15 2154 02/08/15 0249 02/08/15 1117  TROPONINI <0.03 <0.03 <0.03    BNP: Invalid input(s): POCBNP  CBG:  Recent Labs Lab 02/08/15 0807  GLUCAP 102*    Microbiology: No results found for this or any previous visit.  Coagulation Studies: No results for input(s): LABPROT, INR in the last 72 hours.  Urinalysis:  Recent Labs Lab 02/07/15 1857  COLORURINE YELLOW  LABSPEC 1.028  PHURINE 5.0  GLUCOSEU NEGATIVE  HGBUR NEGATIVE  BILIRUBINUR NEGATIVE  KETONESUR NEGATIVE  PROTEINUR NEGATIVE  UROBILINOGEN 0.2  NITRITE NEGATIVE  LEUKOCYTESUR NEGATIVE    Lipid Panel:     Component Value Date/Time   CHOL 224* 02/08/2015 0614   TRIG 179* 02/08/2015 0614   HDL 39* 02/08/2015 0614   CHOLHDL 5.7 02/08/2015 0614   VLDL 36 02/08/2015 0614   LDLCALC 149* 02/08/2015 0614    HgbA1C:  Lab Results  Component Value Date   HGBA1C 5.6 12/21/2012    Urine Drug Screen:     Component Value Date/Time   LABOPIA NONE DETECTED 08/21/2008 0214   COCAINSCRNUR NONE DETECTED 08/21/2008 0214   LABBENZ NONE DETECTED 08/21/2008 0214   AMPHETMU NONE DETECTED 08/21/2008 0214   THCU NONE DETECTED 08/21/2008 0214   LABBARB  08/21/2008 0214    NONE DETECTED        DRUG SCREEN FOR MEDICAL  PURPOSES ONLY.  IF CONFIRMATION IS NEEDED FOR ANY PURPOSE, NOTIFY LAB WITHIN 5 DAYS.        LOWEST DETECTABLE LIMITS FOR URINE DRUG SCREEN Drug Class       Cutoff (ng/mL) Amphetamine      1000 Barbiturate      200 Benzodiazepine   277 Tricyclics       412 Opiates          300 Cocaine          300 THC              50    Alcohol Level: No results for input(s): ETH in the last 168 hours.  Imaging: Dg Chest 2 View  02/07/2015   CLINICAL DATA:  Change in mental status, multiple falls  EXAM: CHEST  2 VIEW  COMPARISON:  01/24/2015 chest radiograph and thoracic spine radiographs  FINDINGS: Evidence of CABG. Mild enlargement of the cardiomediastinal silhouette is noted. Lungs are hypoaerated with crowding of the bronchovascular markings. No new focal pulmonary opacity allowing for hypoaeration. No pleural effusions. Multiple lower thoracic central endplate depression deformities are reidentified, not significantly changed.  IMPRESSION: Low volume exam without focal acute finding.   Electronically Signed   By: Conchita Paris M.D.   On: 02/07/2015 18:07   Ct Head Wo Contrast  02/07/2015   CLINICAL DATA:  Fall.  EXAM: CT HEAD WITHOUT CONTRAST  CT CERVICAL SPINE WITHOUT CONTRAST  TECHNIQUE: Multidetector CT imaging of the head and cervical spine was performed following the standard protocol without intravenous contrast. Multiplanar CT image reconstructions of the cervical spine were also generated.  COMPARISON:  CT scan of cervical spine of January 24, 2015; CT scan of head of December 20, 2012.  FINDINGS: CT HEAD FINDINGS  Bony calvarium appears intact. Mild diffuse cortical atrophy is noted. Mild chronic ischemic white matter disease is noted. No mass effect or midline shift is noted. Ventricular size is within normal limits. There is no evidence of mass lesion, hemorrhage or acute infarction.  CT CERVICAL SPINE FINDINGS  No fracture is noted. Moderate degenerative disc disease is noted at C3-4 and C4-5, with  severe degenerative disc disease seen at C5-6, C6-7 C7-T1. Grade 1 anterolisthesis is noted at C3-4, C4-5 and C5-6 secondary to posterior facet joint hypertrophy. Visualized lung apices appear normal.  IMPRESSION: Mild diffuse cortical atrophy. Mild chronic ischemic white matter disease. No acute intracranial abnormality seen.  Moderate to severe multilevel degenerative disc disease is noted in the cervical spine. No acute abnormality is noted.   Electronically Signed   By: Marijo Conception, M.D.   On: 02/07/2015 17:48   Ct Cervical Spine Wo Contrast  02/07/2015   CLINICAL DATA:  Fall.  EXAM: CT HEAD WITHOUT CONTRAST  CT CERVICAL SPINE WITHOUT CONTRAST  TECHNIQUE: Multidetector CT imaging of the head and cervical spine was performed following the standard protocol without intravenous contrast. Multiplanar CT image reconstructions of the cervical spine were also generated.  COMPARISON:  CT scan of cervical spine of January 24, 2015; CT scan of head of December 20, 2012.  FINDINGS: CT HEAD FINDINGS  Bony calvarium appears intact. Mild diffuse cortical atrophy is noted. Mild chronic ischemic white matter disease is noted. No mass effect or midline shift is noted. Ventricular size is within normal limits. There is no evidence of mass lesion, hemorrhage or acute infarction.  CT CERVICAL SPINE FINDINGS  No fracture is noted. Moderate degenerative disc disease is noted at C3-4 and C4-5, with severe degenerative disc disease seen at C5-6, C6-7 C7-T1. Grade 1 anterolisthesis is noted at C3-4, C4-5 and C5-6 secondary to posterior facet joint hypertrophy. Visualized lung apices appear normal.  IMPRESSION: Mild diffuse cortical atrophy. Mild chronic ischemic white matter disease. No acute intracranial abnormality seen.  Moderate to severe multilevel degenerative disc disease is noted in the cervical spine. No acute abnormality is noted.   Electronically Signed   By: Marijo Conception, M.D.   On: 02/07/2015 17:48      Assessment/Plan:  81y/o gentleman hx of seizures, prior CVA, HTN, HLD, CAD presenting with falls and altered mental status. History is pertinent for recent use of benadryl which appears to coincide with onset of above  symptoms. Exam limited due to patient participation but overall appears non-focal. Suspect symptoms related to use of benadryl in an elderly patient.  -check MRI brain -agree with discontinuing benadryl -if MRI negative no further neurological workup indicated at this time  Jim Like, DO [facility] [phone]  If 7pm- 7am, please page neurology on call as listed in [name]. 02/08/2015, 2:01 PM

## 2015-02-08 NOTE — Progress Notes (Signed)
RT went by to instruct Pt on use of Flutter Valve.  Pt was sleeping at and RT couldn't wake Pt up enough to instruct on use of Flutter Valve.  RT will attempt in the am when pt is fully awake and can participate.  Flutter Valve left at bedside. RT to monitor and assess as needed.

## 2015-02-08 NOTE — ED Notes (Signed)
Unable to complete 0400-0600 neuro checks.

## 2015-02-08 NOTE — ED Notes (Signed)
Nuclear Med Tech and this Probation officer went into pt's room to assess pt and families willingness to have VQ scan done.  Pt was willing however his mental status is altered at this time.  Pt's son adamantly refusing VQ scan or CTA despite this Probation officer educating pt's son that if pt does have a PE and we wait until the morning to preform diagnostic testing the pt could die.  Pt's son verbalized understanding and stated that, "I don't want my family mad at me for Larry Bradford' his kidneys."  Despite the best efforts of myself and Dr. Blaine Hamper explaining in detail both diagnostic testing and the paramount importance of the pt getting these tests we were unable to obtain pt's sons consent.

## 2015-02-08 NOTE — Progress Notes (Signed)
Subjective: Patient admitted this morning, see detailed H&P by Dr Blaine Hamper 81 y.o. male with PMH of seizure, hypertension, hyperlipidemia, CAD, s/p CABG, CKD-III, BPH, OSA, aortic aneurysm, cardiac artery stenosis, DDD, lower GI bleeding, stroke, diastolic congestive heart failure (EF 62 mL 5% with grade 2 diastolic dysfunction), depression, anxiety, rashes in extremities, who presents with fall, AMS and rashes. He was seen by a dermatologist a week ago, was dx with dermatitis and was givenTriamcinolone cream. He is also on Benadryl and Keflex which were prescribed by his PCP per his wife. In ED, patient was found to have oxygen desaturation to 87% which improved to 100% on nasal cannula, tachycardia, positive D dimer, negative troponin, negative urinalysis, stable renal function, WBC 8.4, temperature normal. CT head and C-spine are negative for acute intracranial abnormalities. Family refused CT angiogram chest. V/Q scan has been ordered. Neurology was consulted and they recommended to transfer the patient to [facility] for further evaluation.  Patient is A Ox 3 at this time, denies any complaints.    Filed Vitals:   02/08/15 0731  BP: 148/87  Pulse: 111  Temp:   Resp: 13    Chest: Clear Bilaterally Heart : S1S2 RRR Abdomen: Soft, nontender Ext : No edema Neuro: Alert, oriented x 3 Skin- Diffuse maculopapular rash noted on the extremities, torso, with scratch marks    A/P  Altered mental status Positive D Dimer Maculopapular rash CAD Seizure disorder  Will transfer the patient to [facility], Dr Sloan Leiter is the accepting physician Dr Janann Colonel to evaluate the patient when he reaches [facility] Will check venous duplex of lower extremities to r/o DVT Follow V/Q scan Hold Benadryl Continue topical Kenalog cream,keflex   Discussed with patient's son at bedside     [place] Pager- [phone]

## 2015-02-08 NOTE — ED Notes (Signed)
Pt became very confused this am.  Pt pulled his IV out, speech became slurred and pt would not open his eyes.  Pt is moving all extremities equally however cannot follow commands at this time.  This is a significant change from when pt went to sleep.  Dr. Blaine Hamper is aware.

## 2015-02-08 NOTE — Progress Notes (Signed)
ANTICOAGULATION CONSULT NOTE - Initial Consult  Pharmacy Consult for Lovenox Indication: rule out PE  No Known Allergies  Vital Signs: Temp: 97.5 F (36.4 C) (09/10 1405) Temp Source: Oral (09/10 1405) BP: 170/97 mmHg (09/10 1405) Pulse Rate: 116 (09/10 1405)  Labs:  Recent Labs  02/07/15 1748 02/07/15 2154 02/08/15 0249 02/08/15 0614 02/08/15 1117  HGB 15.0  --   --  15.5  --   HCT 45.5  --   --  46.2  --   PLT 192  --   --  170  --   CREATININE 1.35*  --   --  1.14  --   TROPONINI  --  <0.03 <0.03  --  <0.03    CrCl cannot be calculated (Unknown ideal weight.).   Medical History: Past Medical History  Diagnosis Date  . Seizures     Onset in Sept 2006  . Cataract   . Hard of hearing   . Renal disorder   . Depression   . HTN (hypertension)   . BPH (benign prostatic hyperplasia)   . CAD (coronary artery disease)     s/p CABG in 2002  . Hyperlipidemia   . OSA (obstructive sleep apnea)   . Aortic aneurysm     3 CM in 2007  . Carotid stenosis   . DDD (degenerative disc disease)   . Trigeminal neuralgia   . Lower GI bleed   . Stroke   . Allergy   . CHF (congestive heart failure)   . History of heart bypass surgery      Assessment: 81 year old male to begin Lovenox for rule out PE  Goal of Therapy:  Anti-Xa level 0.6-1 units/ml 4hrs after LMWH dose given Monitor platelets by anticoagulation protocol: Yes   Plan:  Lovenox 75 mg sq Q 12 hours Follow up CBC, Scr  Thank you Anette Guarneri, PharmD [phone]  02/08/2015,3:05 PM

## 2015-02-08 NOTE — Progress Notes (Addendum)
PATIENT DETAILS Name: Larry Bradford Age: 81 y.o. Sex: male Date of Birth: 1917-05-24 Admit Date: 02/07/2015 Admitting Physician Ivor Costa, MD ERX:[id number],DTOIZ, MD  Summary: 81 year old male with history of seizure disorder, CVA, aortic stenosis, recently diagnosed with dermatitis and started on triamcinolone cream and Benadryl, brought to the hospital for evaluation of frequent falls and altered mental status. Further workup demonstrated a elevated d-dimer, since patient continued to get agitated and confused, neurology was consulted who recommended transfer to [facility]. After transfer to [facility], patient started complaining of diffuse abdominal pain-hence I was called to reevaluate the patient.  Subjective: Hard of hearing-had a BM while I was in the room. Complaining of diffuse abdominal tenderness.  Assessment/Plan: Principal Problem: Acute encephalopathy: Suspect from Benadryl. Mental status seems to have significantly improved, during my exam able to participate and follow commands. He is hard of hearing but easily redirected. Answering all questions appropriately. Stepdaughter/spouse acknowledge clinical improvement. Seen by neurology, recommendations are for MRI brain. CT head on admission was negative.  Active Problems: Diffuse abdominal pain: On exam abdomen is soft, but diffusely tender. No guarding or rigidity. Abdominal x-ray negative. Although mental status has improved, patient is hard of hearing-and a very poor historian. Will check a CT scan of the abdomen, keep nothing by mouth for now, start PPI, as needed narcotics and antiemetics. Check lipase.  Enlarged scrotum: Not known as this is new finding-both of his testicles are tender to gentle palpation-we'll check a Doppler ultrasound to make sure no torsion-although doubt.  Elevated d-dimer: Not hypoxic during my exam-in fact O2 saturation in the high 90s. Lungs are completely clear. Lower  extremity Dopplers are negative. Low suspicion for pulmonary embolism. Patient/family refused CT angios/VQ scan last night. Suspect we could hold off on further investigations at this point.  Dermatitis/skin rash: Per family this has been ongoing for the past 3 weeks-rash is generalized-patient has had follow-up with dermatologist (Dr. Ronnald Ramp) and was told that this is dermatitis him and was given triamcinolone ointment and Benadryl. Will continue triamcinolone, hold Benadryl, may need a short course of steroids once his abdominal pain issue has been addressed.  History of chronic kidney disease stage III: Creatinine close to usual baseline, follow.  History of CAD: No chest pain or shortness of breath currently. Continue aspirin  History of CVA: Nonfocal exam. Continue aspirin.  History of seizure disorder: Continue with Keppra.  History of Moderate aortic stenosis: Outpatient follow-up with primary cardiologist.  Disposition: Remain inpatient  Antimicrobial agents  See below  Anti-infectives    Start     Dose/Rate Route Frequency Ordered Stop   02/08/15 1515  ceFAZolin (ANCEF) IVPB 1 g/50 mL premix     1 g 100 mL/hr over 30 Minutes Intravenous 3 times per day 02/08/15 1513     02/07/15 2200  cephALEXin (KEFLEX) capsule 500 mg  Status:  Discontinued     500 mg Oral 3 times daily 02/07/15 2047 02/08/15 1512      DVT Prophylaxis: Prophylactic Heparin   Code Status: Full code   Family Communication Spouse/step daughter at bedside  Procedures: None  CONSULTS:  neurology  Time spent 40 minutes-Greater than 50% of this time was spent in counseling, explanation of diagnosis, planning of further management, and coordination of care.  MEDICATIONS: Scheduled Meds: . aspirin  325 mg Oral Daily  .  ceFAZolin (ANCEF) IV  1 g Intravenous 3 times  per day  . levETIRAcetam  500 mg Intravenous Q12H  . pantoprazole (PROTONIX) IV  40 mg Intravenous Q24H  . sodium chloride  3 mL  Intravenous Q12H  . triamcinolone cream  1 application Topical BID   Continuous Infusions: . sodium chloride Stopped (02/08/15 0707)   PRN Meds:.acetaminophen, morphine injection, ondansetron (ZOFRAN) IV    PHYSICAL EXAM: Vital signs in last 24 hours: Filed Vitals:   02/08/15 0731 02/08/15 1111 02/08/15 1405 02/08/15 1619  BP: 148/87 148/76 170/97 171/98  Pulse: 111 106 116 111  Temp: 98.3 F (36.8 C)  97.5 F (36.4 C) 97.6 F (36.4 C)  TempSrc: Oral  Oral Oral  Resp: 13 14 15 16   SpO2: 94% 95% 90% 96%    Weight change:  There were no vitals filed for this visit. There is no weight on file to calculate BMI.   Gen Exam: Awake and alert with clear speech.  Hard of hearing-sleeping comfortably when I walked in  Neck: Supple, No JVD.  Chest: B/L Clear.   CVS: S1 S2 Regular, +syst murmur Abdomen: soft, BS +, difusely but mildy tender, non distended.  Extremities: no edema, lower extremities warm to touch. Neurologic: Non Focal.   Wounds: N/A.    Intake/Output from previous day: No intake or output data in the 24 hours ending 02/08/15 1643   LAB RESULTS: CBC  Recent Labs Lab 02/07/15 1748 02/08/15 0614  WBC 8.4 7.3  HGB 15.0 15.5  HCT 45.5 46.2  PLT 192 170  MCV 95.0 96.0  MCH 31.3 32.2  MCHC 33.0 33.5  RDW 13.5 13.5  LYMPHSABS 1.2  --   MONOABS 0.5  --   EOSABS 0.3  --   BASOSABS 0.0  --     Chemistries   Recent Labs Lab 02/07/15 1748 02/08/15 0614  NA 145 141  K 4.4 4.6  CL 109 110  CO2 30 20*  GLUCOSE 134* 93  BUN 25* 22*  CREATININE 1.35* 1.14  CALCIUM 9.6 9.0    CBG:  Recent Labs Lab 02/08/15 0807  GLUCAP 102*    GFR CrCl cannot be calculated (Unknown ideal weight.).  Coagulation profile No results for input(s): INR, PROTIME in the last 168 hours.  Cardiac Enzymes  Recent Labs Lab 02/07/15 2154 02/08/15 0249 02/08/15 1117  TROPONINI <0.03 <0.03 <0.03    Invalid input(s): POCBNP  Recent Labs  02/07/15 1733    DDIMER 3.58*   No results for input(s): HGBA1C in the last 72 hours.  Recent Labs  02/08/15 0614  CHOL 224*  HDL 39*  LDLCALC 149*  TRIG 179*  CHOLHDL 5.7   No results for input(s): TSH, T4TOTAL, T3FREE, THYROIDAB in the last 72 hours.  Invalid input(s): FREET3 No results for input(s): VITAMINB12, FOLATE, FERRITIN, TIBC, IRON, RETICCTPCT in the last 72 hours. No results for input(s): LIPASE, AMYLASE in the last 72 hours.  Urine Studies No results for input(s): UHGB, CRYS in the last 72 hours.  Invalid input(s): UACOL, UAPR, USPG, UPH, UTP, UGL, UKET, UBIL, UNIT, UROB, ULEU, UEPI, UWBC, URBC, UBAC, CAST, UCOM, BILUA  MICROBIOLOGY: No results found for this or any previous visit (from the past 240 hour(s)).  RADIOLOGY STUDIES/RESULTS: Dg Chest 2 View  02/07/2015   CLINICAL DATA:  Change in mental status, multiple falls  EXAM: CHEST  2 VIEW  COMPARISON:  01/24/2015 chest radiograph and thoracic spine radiographs  FINDINGS: Evidence of CABG. Mild enlargement of the cardiomediastinal silhouette is noted. Lungs are hypoaerated with crowding  of the bronchovascular markings. No new focal pulmonary opacity allowing for hypoaeration. No pleural effusions. Multiple lower thoracic central endplate depression deformities are reidentified, not significantly changed.  IMPRESSION: Low volume exam without focal acute finding.   Electronically Signed   By: Conchita Paris M.D.   On: 02/07/2015 18:07   Dg Ribs Unilateral W/chest Left  01/24/2015   CLINICAL DATA:  Pain on the left side.  Initial encounter.  EXAM: LEFT RIBS AND CHEST - 3+ VIEW  COMPARISON:  01/07/2013  FINDINGS: Hypoventilation with interstitial crowding at the bases. Stable cardiomegaly and aortic contours in this patient post CABG.  Underpenetration limits evaluation of the lower ribs, especially below rib 7. No appreciable fracture or rib lesion.  There is no edema, consolidation, effusion, or pneumothorax.  IMPRESSION: 1. No evidence  of left rib fracture. 2. Technical factors significantly limit lower rib visualization. 3. No evidence of pneumothorax or hemothorax.   Electronically Signed   By: Monte Fantasia M.D.   On: 01/24/2015 15:47   Dg Cervical Spine 2 Or 3 Views  01/24/2015   CLINICAL DATA:  Neck pain  EXAM: CERVICAL SPINE - 2-3 VIEW  COMPARISON:  None.  FINDINGS: The C1 through C6 vertebral bodies are well visualized. C7 and T1 vertebral bodies are obscured by overlying osseous and soft tissue structures.  Degenerative changes are seen throughout the cervical spine, at least moderate in degree throughout, with associated disc space narrowings and osseous spurring. There is a minimal anterolisthesis of C3, likely related to the underlying degenerative changes. Alignment of the C1 through C6 vertebral bodies appear otherwise normal. No fracture line or displaced fracture fragment identified. Posterior elements of the cervical spine appear grossly well aligned throughout.  There is some irregularity at the cervical thoracic junction which is poorly seen due to overlying osseous and soft tissue structures. Fracture or dislocation cannot be excluded at these levels.  IMPRESSION: Limited study, as detailed above. C7 vertebral body and upper thoracic vertebral bodies are poorly seen due to overlying osseous and soft tissue structures. There may be a slight deformity/displacement of an upper thoracic vertebral body but not convincing. If symptomatic at these levels, would recommend CT for more definitive characterization.  C1 through C6 vertebral bodies appear intact without evidence of acute fracture or subluxation.  Fairly extensive degenerative changes throughout the cervical spine.   Electronically Signed   By: Franki Cabot M.D.   On: 01/24/2015 15:50   Dg Thoracic Spine 2 View  01/24/2015   CLINICAL DATA:  Thoracic pain following fall 2 weeks ago. Initial encounter.  EXAM: THORACIC SPINE 2 VIEWS  COMPARISON:  02/2013 and prior chest  radiographs  FINDINGS: There is no evidence of acute thoracic spine fracture or subluxation.  Mild multilevel degenerative disc disease noted.  No focal bony lesions are identified.  IMPRESSION: No acute thoracic spine abnormality.   Electronically Signed   By: Margarette Canada M.D.   On: 01/24/2015 15:46   Ct Head Wo Contrast  02/07/2015   CLINICAL DATA:  Fall.  EXAM: CT HEAD WITHOUT CONTRAST  CT CERVICAL SPINE WITHOUT CONTRAST  TECHNIQUE: Multidetector CT imaging of the head and cervical spine was performed following the standard protocol without intravenous contrast. Multiplanar CT image reconstructions of the cervical spine were also generated.  COMPARISON:  CT scan of cervical spine of January 24, 2015; CT scan of head of December 20, 2012.  FINDINGS: CT HEAD FINDINGS  Bony calvarium appears intact. Mild diffuse cortical atrophy is noted.  Mild chronic ischemic white matter disease is noted. No mass effect or midline shift is noted. Ventricular size is within normal limits. There is no evidence of mass lesion, hemorrhage or acute infarction.  CT CERVICAL SPINE FINDINGS  No fracture is noted. Moderate degenerative disc disease is noted at C3-4 and C4-5, with severe degenerative disc disease seen at C5-6, C6-7 C7-T1. Grade 1 anterolisthesis is noted at C3-4, C4-5 and C5-6 secondary to posterior facet joint hypertrophy. Visualized lung apices appear normal.  IMPRESSION: Mild diffuse cortical atrophy. Mild chronic ischemic white matter disease. No acute intracranial abnormality seen.  Moderate to severe multilevel degenerative disc disease is noted in the cervical spine. No acute abnormality is noted.   Electronically Signed   By: Marijo Conception, M.D.   On: 02/07/2015 17:48   Ct Cervical Spine Wo Contrast  02/07/2015   CLINICAL DATA:  Fall.  EXAM: CT HEAD WITHOUT CONTRAST  CT CERVICAL SPINE WITHOUT CONTRAST  TECHNIQUE: Multidetector CT imaging of the head and cervical spine was performed following the standard protocol  without intravenous contrast. Multiplanar CT image reconstructions of the cervical spine were also generated.  COMPARISON:  CT scan of cervical spine of January 24, 2015; CT scan of head of December 20, 2012.  FINDINGS: CT HEAD FINDINGS  Bony calvarium appears intact. Mild diffuse cortical atrophy is noted. Mild chronic ischemic white matter disease is noted. No mass effect or midline shift is noted. Ventricular size is within normal limits. There is no evidence of mass lesion, hemorrhage or acute infarction.  CT CERVICAL SPINE FINDINGS  No fracture is noted. Moderate degenerative disc disease is noted at C3-4 and C4-5, with severe degenerative disc disease seen at C5-6, C6-7 C7-T1. Grade 1 anterolisthesis is noted at C3-4, C4-5 and C5-6 secondary to posterior facet joint hypertrophy. Visualized lung apices appear normal.  IMPRESSION: Mild diffuse cortical atrophy. Mild chronic ischemic white matter disease. No acute intracranial abnormality seen.  Moderate to severe multilevel degenerative disc disease is noted in the cervical spine. No acute abnormality is noted.   Electronically Signed   By: Marijo Conception, M.D.   On: 02/07/2015 17:48   Ct Cervical Spine Wo Contrast  01/24/2015   CLINICAL DATA:  Patient fell approximate 1 month ago walking home. Complaining of left lateral neck and shoulder pain.  EXAM: CT CERVICAL SPINE WITHOUT CONTRAST  CT THORACIC SPINE WITHOUT CONTRAST  TECHNIQUE: Multidetector CT imaging of the cervical and thoracic spine was performed without contrast. Multiplanar CT image reconstructions were also generated.  COMPARISON:  Current cervical and thoracic radiographs  FINDINGS: CT CERVICAL SPINE FINDINGS  No fracture. No significant spondylolisthesis. There is a reversal of the normal cervical lordosis centered at C6. Marked loss of disc height at C5-C6 and C7-T1. Moderate loss disc height at C4-C5 and C6-C7. Mild loss disc height at C3-C4. There are facet degenerative changes evident greater  on the left from C2-C3 through C5-C6. There are varying degrees of neural foraminal narrowing from uncovertebral and facet spurring. This is greatest on the left at C3-C4, moderate. There is mild central stenosis at C6-C7.  Bones are diffusely demineralized.  There are carotid vascular calcifications bilaterally. Soft tissues otherwise unremarkable.  CT THORACIC SPINE FINDINGS  There is normal vertebral body stature and alignment. No fractures. There are mild disc degenerative changes along the mid and upper thoracic spine reflected by minor loss disc height and small endplate osteophytes. No significant disc bulging. No evidence of a disc herniation. Neural foramina  and central spinal canal well preserved.  Surrounding soft tissues show aortic and branch vessel vascular calcifications. Possible partly imaged right renal cyst. No masses or adenopathy. No acute findings in the visualize lungs.  IMPRESSION: CERVICAL CT: No acute fracture or acute finding. There are significant degenerative changes throughout the cervical spine.  THORACIC CT:  No fracture or acute finding.   Electronically Signed   By: Lajean Manes M.D.   On: 01/24/2015 17:27   Ct Thoracic Spine Wo Contrast  01/24/2015   CLINICAL DATA:  Patient fell approximate 1 month ago walking home. Complaining of left lateral neck and shoulder pain.  EXAM: CT CERVICAL SPINE WITHOUT CONTRAST  CT THORACIC SPINE WITHOUT CONTRAST  TECHNIQUE: Multidetector CT imaging of the cervical and thoracic spine was performed without contrast. Multiplanar CT image reconstructions were also generated.  COMPARISON:  Current cervical and thoracic radiographs  FINDINGS: CT CERVICAL SPINE FINDINGS  No fracture. No significant spondylolisthesis. There is a reversal of the normal cervical lordosis centered at C6. Marked loss of disc height at C5-C6 and C7-T1. Moderate loss disc height at C4-C5 and C6-C7. Mild loss disc height at C3-C4. There are facet degenerative changes evident  greater on the left from C2-C3 through C5-C6. There are varying degrees of neural foraminal narrowing from uncovertebral and facet spurring. This is greatest on the left at C3-C4, moderate. There is mild central stenosis at C6-C7.  Bones are diffusely demineralized.  There are carotid vascular calcifications bilaterally. Soft tissues otherwise unremarkable.  CT THORACIC SPINE FINDINGS  There is normal vertebral body stature and alignment. No fractures. There are mild disc degenerative changes along the mid and upper thoracic spine reflected by minor loss disc height and small endplate osteophytes. No significant disc bulging. No evidence of a disc herniation. Neural foramina and central spinal canal well preserved.  Surrounding soft tissues show aortic and branch vessel vascular calcifications. Possible partly imaged right renal cyst. No masses or adenopathy. No acute findings in the visualize lungs.  IMPRESSION: CERVICAL CT: No acute fracture or acute finding. There are significant degenerative changes throughout the cervical spine.  THORACIC CT:  No fracture or acute finding.   Electronically Signed   By: Lajean Manes M.D.   On: 01/24/2015 17:27   Dg Abd Portable 1v  02/08/2015   CLINICAL DATA:  Acute abdominal pain  EXAM: PORTABLE ABDOMEN - 1 VIEW  COMPARISON:  01/29/2008  FINDINGS: No evidence of bowel obstruction or perforation. No concerning calcification or mass effect. Atherosclerotic calcification.  IMPRESSION: Normal bowel gas pattern.   Electronically Signed   By: Monte Fantasia M.D.   On: 02/08/2015 16:24    Oren Binet, MD  [facility] Pager:[phone]  If 7PM-7AM, please contact night-coverage [email or web address] Password [email or web address] 02/08/2015, 4:43 PM   LOS: 1 day

## 2015-02-08 NOTE — Progress Notes (Addendum)
Patient was transferred to [facility] from [facility] because of altered mentation and concern for possible stroke. Has subsequently been evaluated by neurology. An MRI has been ordered. Neurology suspect patient's altered mental status secondary to use of Benadryl in an elderly patient and agreed with discontinuing the Benadryl. At [facility] patient was found to be hypoxemic with O2 sat duration 87% that improved with 100% on nasal cannula. He was also tachycardic with a d-dimer of 3.58. He has subsequently undergone lower extremity duplex study which was negative for DVT. He remains mildly hypoxemic with O2 sats of 90%. Chest x-ray completed on 9/9 demonstrate low lung volumes without acute findings. Radiology has contacted the nursing staff and have reported that since patient is medically stable that they will be unable to pursue VQ scan until tomorrow 9/11. Despite negative lower strategy duplex study since patient persists with hypoxemia and tachycardia as a precaution we'll place patient on full dose Lovenox with recommendation to follow-up on VQ scan before discontinuing this medication. CT of the head performed on 9/9 without any evidence of intracranial bleeding. Because of low lung volumes noted on previous chest x-ray I suspect Benadryl has contributed to oversedation and low lung volumes so have ordered flutter valve 4 hours.  Erin Hearing, ANP

## 2015-02-08 NOTE — Progress Notes (Addendum)
Due to degree of tachycardia seen on VS i opted to physically examine this pt. Found to be writhing in pain with diffuse abdominal pain with guarding and rebounding and has been vomiting. Family at bedside says has not had BM for several days. I have opted to cancel MRI for now, NPO, IVF rate increased to 125/hr, IV Morphine very low dose ordered. Repeat labs stat as well including lactic acid. Have dcd lovenox.  Erin Hearing, ANP

## 2015-02-08 NOTE — Progress Notes (Signed)
PT Cancellation Note  Patient Details Name: Larry Bradford MRN: [id number] DOB: 1916/06/30   Cancelled Treatment:    Reason Eval/Treat Not Completed: Order received. Chart reviewed. Note pt to transfer to [facility] per progress notes. Will hold PT eval  at this time.    Weston Anna, MPT Pager: [phone]

## 2015-02-09 ENCOUNTER — Inpatient Hospital Stay (HOSPITAL_COMMUNITY): Payer: Medicare Other

## 2015-02-09 ENCOUNTER — Encounter (HOSPITAL_COMMUNITY): Payer: Self-pay | Admitting: Internal Medicine

## 2015-02-09 DIAGNOSIS — I350 Nonrheumatic aortic (valve) stenosis: Secondary | ICD-10-CM

## 2015-02-09 DIAGNOSIS — I25810 Atherosclerosis of coronary artery bypass graft(s) without angina pectoris: Secondary | ICD-10-CM | POA: Insufficient documentation

## 2015-02-09 DIAGNOSIS — I4719 Other supraventricular tachycardia: Secondary | ICD-10-CM | POA: Insufficient documentation

## 2015-02-09 DIAGNOSIS — I340 Nonrheumatic mitral (valve) insufficiency: Secondary | ICD-10-CM

## 2015-02-09 DIAGNOSIS — G9340 Encephalopathy, unspecified: Secondary | ICD-10-CM

## 2015-02-09 DIAGNOSIS — R000 Tachycardia, unspecified: Secondary | ICD-10-CM

## 2015-02-09 DIAGNOSIS — W19XXXA Unspecified fall, initial encounter: Secondary | ICD-10-CM

## 2015-02-09 DIAGNOSIS — R569 Unspecified convulsions: Secondary | ICD-10-CM

## 2015-02-09 DIAGNOSIS — I471 Supraventricular tachycardia: Secondary | ICD-10-CM

## 2015-02-09 DIAGNOSIS — R21 Rash and other nonspecific skin eruption: Secondary | ICD-10-CM

## 2015-02-09 LAB — CBC WITH DIFFERENTIAL/PLATELET
Basophils Absolute: 0 10*3/uL (ref 0.0–0.1)
Basophils Relative: 0 % (ref 0–1)
EOS ABS: 0.3 10*3/uL (ref 0.0–0.7)
EOS PCT: 4 % (ref 0–5)
HCT: 40.6 % (ref 39.0–52.0)
Hemoglobin: 13.1 g/dL (ref 13.0–17.0)
LYMPHS ABS: 1.4 10*3/uL (ref 0.7–4.0)
LYMPHS PCT: 19 % (ref 12–46)
MCH: 30.5 pg (ref 26.0–34.0)
MCHC: 32.3 g/dL (ref 30.0–36.0)
MCV: 94.4 fL (ref 78.0–100.0)
MONO ABS: 0.6 10*3/uL (ref 0.1–1.0)
MONOS PCT: 8 % (ref 3–12)
Neutro Abs: 5.2 10*3/uL (ref 1.7–7.7)
Neutrophils Relative %: 69 % (ref 43–77)
PLATELETS: 200 10*3/uL (ref 150–400)
RBC: 4.3 MIL/uL (ref 4.22–5.81)
RDW: 13.5 % (ref 11.5–15.5)
WBC: 7.5 10*3/uL (ref 4.0–10.5)

## 2015-02-09 LAB — COMPREHENSIVE METABOLIC PANEL
ALT: 18 U/L (ref 17–63)
ANION GAP: 9 (ref 5–15)
AST: 21 U/L (ref 15–41)
Albumin: 3 g/dL — ABNORMAL LOW (ref 3.5–5.0)
Alkaline Phosphatase: 58 U/L (ref 38–126)
BUN: 19 mg/dL (ref 6–20)
CHLORIDE: 109 mmol/L (ref 101–111)
CO2: 26 mmol/L (ref 22–32)
CREATININE: 1.13 mg/dL (ref 0.61–1.24)
Calcium: 9 mg/dL (ref 8.9–10.3)
GFR, EST NON AFRICAN AMERICAN: 53 mL/min — AB (ref >60)
Glucose, Bld: 90 mg/dL (ref 65–99)
Potassium: 3.9 mmol/L (ref 3.5–5.1)
SODIUM: 144 mmol/L (ref 135–145)
Total Bilirubin: 0.8 mg/dL (ref 0.3–1.2)
Total Protein: <3 g/dL — ABNORMAL LOW (ref 6.5–8.1)

## 2015-02-09 LAB — GLUCOSE, CAPILLARY: GLUCOSE-CAPILLARY: 87 mg/dL (ref 65–99)

## 2015-02-09 MED ORDER — LEVETIRACETAM 500 MG PO TABS
500.0000 mg | ORAL_TABLET | Freq: Two times a day (BID) | ORAL | Status: DC
Start: 2015-02-09 — End: 2015-02-12
  Administered 2015-02-09 – 2015-02-12 (×6): 500 mg via ORAL
  Filled 2015-02-09 (×6): qty 1

## 2015-02-09 MED ORDER — FINASTERIDE 5 MG PO TABS
5.0000 mg | ORAL_TABLET | Freq: Every day | ORAL | Status: DC
  Administered 2015-02-09 – 2015-02-10 (×2): 5 mg via ORAL
  Filled 2015-02-09 (×2): qty 1

## 2015-02-09 MED ORDER — METOPROLOL SUCCINATE ER 25 MG PO TB24
25.0000 mg | ORAL_TABLET | Freq: Every day | ORAL | Status: DC
  Administered 2015-02-09 – 2015-02-12 (×4): 25 mg via ORAL
  Filled 2015-02-09 (×4): qty 1

## 2015-02-09 NOTE — Progress Notes (Signed)
4E31 Mr Larry Bradford unable to void. Thanks

## 2015-02-09 NOTE — Telephone Encounter (Signed)
I called and spoke with patient's daughter. They're going through an evaluation for altered mental status.

## 2015-02-09 NOTE — Progress Notes (Signed)
Verbal order recd to I/O cath X 1. Patient states that "it hurts a lot". Gave much reassurance. Patient began yelling out with lay out of sterile field. Then continued through entire procedure. Catheter passed with no resistance- 500cc clear dark yellow urine emptied. Writer apologized for the discomfort and patient states,"it wasn't that bad".

## 2015-02-09 NOTE — Evaluation (Signed)
Occupational Therapy Evaluation Patient Details Name: Larry Bradford MRN: [id number] DOB: Oct 10, 1916 Today's Date: 02/09/2015    History of Present Illness 81 y.o. male admitted for acute encephalopathy, found to have elevated D-dimer.   Clinical Impression   Pt admitted with above. Pt getting assist with ADLs, PTA. Feel pt will benefit from acute OT to increase independence and strength prior to d/c.    Follow Up Recommendations  SNF;Supervision/Assistance - 24 hour    Equipment Recommendations  Other (comment) (defer to next venue)    Recommendations for Other Services       Precautions / Restrictions Precautions Precautions: Fall Restrictions Weight Bearing Restrictions: No      Mobility Bed Mobility Overal bed mobility: Modified Independent                Transfers Overall transfer level: Needs assistance   Transfers: Sit to/from Stand Sit to Stand: Min guard         General transfer comment: cues for hand placement; Used RW for support upon standing.    Balance      LOB when standing at sink during functional task-Min assist. Min guard-Min assist for ambulation.                                      ADL Overall ADL's : Needs assistance/impaired     Grooming: Wash/dry face;Min guard;Standing    Upper Body Bathing: Minimal assitance;Sitting;Standing;Set up (setup-sitting; LOB when performing standing; washed arms and armpits and rinsed off head)           Lower Body Dressing: Min guard;Minimal assistance;Sit to/from stand   Toilet Transfer: Min guard;Minimal assistance;Ambulation;RW (sit to stand from bed)           Functional mobility during ADLs: Min guard;Minimal assistance;Rolling walker       Vision     Perception     Praxis      Pertinent Vitals/Pain Pain Assessment: No/denies pain; HR stable in session.     Hand Dominance Right   Extremity/Trunk Assessment Upper Extremity Assessment Upper  Extremity Assessment: Generalized weakness   Lower Extremity Assessment Lower Extremity Assessment: Defer to PT evaluation       Communication Communication Communication: HOH   Cognition Arousal/Alertness: Awake/alert Behavior During Therapy: WFL for tasks assessed/performed Overall Cognitive Status: Difficult to assess (difficult to assess due to [facility]; also no family present )                     General Comments       Exercises       Shoulder Instructions      Home Living Family/patient expects to be discharged to:: Private residence Living Arrangements: Spouse/significant other Available Help at Discharge: Family;Available 24 hours/day Type of Home: House Home Access: Stairs to enter [employer] of Steps: 5-6 Entrance Stairs-Rails: Right;Left Home Layout: Two level;Bed/bath upstairs (has chair lift for stairs) Alternate Level Stairs-Number of Steps: 12   Bathroom Shower/Tub: Tub/shower unit;Walk-in shower         Home Equipment: Gilford Rile - 2 wheels;Other (comment) (chair lift for stairs)          Prior Functioning/Environment Level of Independence: Needs assistance  Gait / Transfers Assistance Needed: Wife reports 4-5 falls on average per day. Does not use his RW. ADL's / Homemaking Assistance Needed: Wife assists with ADLs        OT  Diagnosis: Generalized weakness   OT Problem List: Decreased strength;Impaired balance (sitting and/or standing);Decreased knowledge of use of DME or AE;Decreased knowledge of precautions   OT Treatment/Interventions: Self-care/ADL training;Therapeutic exercise;DME and/or AE instruction;Therapeutic activities;Cognitive remediation/compensation;Patient/family education;Balance training    OT Goals(Current goals can be found in the care plan section) Acute Rehab OT Goals Patient Stated Goal: not stated OT Goal Formulation: With patient Time For Goal Achievement: 02/16/15 Potential to Achieve Goals: Good ADL  Goals Pt Will Perform Grooming: with set-up;standing;with supervision Pt Will Perform Lower Body Dressing: with set-up;with supervision;sit to/from stand;with caregiver independent in assisting Pt Will Transfer to Toilet: with supervision;ambulating Pt Will Perform Toileting - Clothing Manipulation and hygiene: sit to/from stand;with supervision Additional ADL Goal #1: Pt will independently perform HEP for bilateral UEs to increase strength.  OT Frequency: Min 2X/week   Barriers to D/C:            Co-evaluation              End of Session Equipment Utilized During Treatment: Gait belt;Rolling walker  Activity Tolerance: Patient tolerated treatment well Patient left: in bed;with call bell/phone within reach;with bed alarm set   Time: 6861-6837 OT Time Calculation (min): 16 min Charges:  OT General Charges $OT Visit: 1 Procedure OT Evaluation $Initial OT Evaluation Tier I: 1 Procedure G-CodesBenito Bradford OTR/L [phone] 02/09/2015, 4:31 PM

## 2015-02-09 NOTE — Evaluation (Signed)
Physical Therapy Evaluation Patient Details Name: Larry Bradford MRN: [id number] DOB: 1916-08-31 Today's Date: 02/09/2015   History of Present Illness  81 y.o. male admitted for acute encephalopathy, found to have elevated D-dimer.  Clinical Impression  Pt admitted with the above complications. Pt currently with functional limitations due to the deficits listed below (see PT Problem List). Wife reports patient falls on average 4-5x per day and she cannot physically assist him with ambulation. Pt today required frequent min assist for sit<>stand transfer and to ambulate safely with a rolling walker. HR highly variable between 47 and 137 irregardless of whether he was resting or moving. Pt will benefit from skilled PT to increase their independence and safety with mobility to allow discharge to the venue listed below.       Follow Up Recommendations SNF;Supervision/Assistance - 24 hour    Equipment Recommendations  Other (comment) (TBD next venue of care)    Recommendations for Other Services       Precautions / Restrictions Precautions Precautions: Fall Restrictions Weight Bearing Restrictions: No      Mobility  Bed Mobility Overal bed mobility: Needs Assistance Bed Mobility: Supine to Sit     Supine to sit: Supervision     General bed mobility comments: Supervision for safety. Cues for technique. No assist needed  Transfers Overall transfer level: Needs assistance Equipment used: Rolling walker (2 wheeled) Transfers: Sit to/from Stand Sit to Stand: Min assist         General transfer comment: Min assist for boost and balance with posterior lean. VC for hand placement.  Ambulation/Gait Ambulation/Gait assistance: Min assist Ambulation Distance (Feet): 55 Feet Assistive device: Rolling walker (2 wheeled) Gait Pattern/deviations: Step-through pattern;Decreased stride length;Trunk flexed;Drifts right/left Gait velocity: slow Gait velocity interpretation: <1.8  ft/sec, indicative of risk for recurrent falls General Gait Details: Educated on safe DME use with a rolling walker. Needs min assist for walker control. VC for upright posture. HR elevated to 137 with this short distance. SpO2 90% on room air.   Stairs            Wheelchair Mobility    Modified Rankin (Stroke Patients Only)       Balance Overall balance assessment: Needs assistance Sitting-balance support: No upper extremity supported;Feet supported Sitting balance-Leahy Scale: Fair     Standing balance support: Bilateral upper extremity supported Standing balance-Leahy Scale: Poor                               Pertinent Vitals/Pain Pain Assessment: No/denies pain    Home Living Family/patient expects to be discharged to:: Private residence Living Arrangements: Spouse/significant other Available Help at Discharge: Family;Available 24 hours/day Type of Home: House Home Access: Stairs to enter Entrance Stairs-Rails: Psychiatric nurse of Steps: 5-6 Home Layout: Two level;Bed/bath upstairs (Has chair lift for stairs) Home Equipment: Walker - 2 wheels;Other (comment) (Chair lift for stairs)      Prior Function Level of Independence: Needs assistance   Gait / Transfers Assistance Needed: Wife reports 4-5 falls on average per day. Does not use his RW.  ADL's / Homemaking Assistance Needed: Wife assists with ADLs        Hand Dominance   Dominant Hand: Right    Extremity/Trunk Assessment   Upper Extremity Assessment: Defer to OT evaluation           Lower Extremity Assessment: Generalized weakness;Difficult to assess due to impaired cognition  Communication   Communication: HOH  Cognition Arousal/Alertness: Awake/alert Behavior During Therapy: WFL for tasks assessed/performed Overall Cognitive Status: History of cognitive impairments - at baseline (family states back to baseline)                       General Comments General comments (skin integrity, edema, etc.): Family present. Wife states pt falls, on average, 4-5 times per day. States she cannot safely care for pt in this condition. These falls occur without syncope, he does not use his RW, and appears that his legs buckle spontaneously.    Exercises        Assessment/Plan    PT Assessment Patient needs continued PT services  PT Diagnosis Difficulty walking;Abnormality of gait;Generalized weakness;Altered mental status   PT Problem List Decreased strength;Decreased activity tolerance;Decreased balance;Decreased mobility;Decreased cognition;Decreased knowledge of use of DME;Decreased safety awareness;Cardiopulmonary status limiting activity  PT Treatment Interventions DME instruction;Gait training;Functional mobility training;Therapeutic activities;Therapeutic exercise;Balance training;Patient/family education   PT Goals (Current goals can be found in the Care Plan section) Acute Rehab PT Goals Patient Stated Goal: none stated PT Goal Formulation: With family Time For Goal Achievement: 02/23/15 Potential to Achieve Goals: Good    Frequency Min 3X/week   Barriers to discharge Decreased caregiver support Wife unable to physically assist pt    Co-evaluation               End of Session Equipment Utilized During Treatment: Gait belt Activity Tolerance: Patient tolerated treatment well Patient left: in chair;with call bell/phone within reach;with chair alarm set;with family/visitor present Nurse Communication: Mobility status;Precautions (HR variable from 40s-130s)         Time: 9201-0071 PT Time Calculation (min) (ACUTE ONLY): 29 min   Charges:   PT Evaluation $Initial PT Evaluation Tier I: 1 Procedure PT Treatments $Gait Training: 8-22 mins   PT G CodesEllouise Newer 02/09/2015, 2:39 PM  Camille Bal [place], [place]

## 2015-02-09 NOTE — Progress Notes (Signed)
Subjective: Resting comfortably. No overnight events.   Objective: Current vital signs: BP 108/53 mmHg  Pulse 71  Temp(Src) 97.8 F (36.6 C) (Oral)  Resp 16  SpO2 94% Vital signs in last 24 hours: Temp:  [97.4 F (36.3 C)-97.8 F (36.6 C)] 97.8 F (36.6 C) (09/11 0520) Pulse Rate:  [71-121] 71 (09/11 0520) Resp:  [14-17] 16 (09/11 0520) BP: (108-171)/(53-98) 108/53 mmHg (09/11 0520) SpO2:  [90 %-97 %] 94 % (09/11 0520)  Intake/Output from previous day: 09/10 0701 - 09/11 0700 In: -  Out: 550 [Urine:550] Intake/Output this shift:   Nutritional status: Diet clear liquid Room service appropriate?: Yes; Fluid consistency:: Thin  Neurologic Exam: Lethargic but easily aroused, oriented to name, hospital, 2016, slightly tangential thought process. No signs of aphasia.  Cranial Nerves: II: pupils equal, round, reactive to light  III,IV, VI: ptosis not present, extra-ocular motions intact bilaterally V,VII: smile symmetric, facial light touch sensation normal bilaterally Motor: moves all extremities symmetrically and against light   Lab Results: Basic Metabolic Panel:  Recent Labs Lab 02/07/15 1748 02/08/15 0614 02/08/15 1640  NA 145 141 140  K 4.4 4.6 4.1  CL 109 110 104  CO2 30 20* 27  GLUCOSE 134* 93 109*  BUN 25* 22* 18  CREATININE 1.35* 1.14 1.23  CALCIUM 9.6 9.0 9.3    Liver Function Tests:  Recent Labs Lab 02/07/15 1748 02/08/15 1640  AST 23 23  ALT 20 21  ALKPHOS 65 72  BILITOT 0.6 1.4*  PROT 8.0 7.7  ALBUMIN 3.8 3.6    Recent Labs Lab 02/08/15 1640  LIPASE 39   No results for input(s): AMMONIA in the last 168 hours.  CBC:  Recent Labs Lab 02/07/15 1748 02/08/15 0614 02/08/15 1640 02/09/15 0700  WBC 8.4 7.3 7.3 7.5  NEUTROABS 6.4  --   --  5.2  HGB 15.0 15.5 15.2 13.1  HCT 45.5 46.2 45.9 40.6  MCV 95.0 96.0 93.9 94.4  PLT 192 170 212 200    Cardiac Enzymes:  Recent Labs Lab 02/07/15 2154 02/08/15 0249 02/08/15 1117   TROPONINI <0.03 <0.03 <0.03    Lipid Panel:  Recent Labs Lab 02/08/15 0614  CHOL 224*  TRIG 179*  HDL 39*  CHOLHDL 5.7  VLDL 36  LDLCALC 149*    CBG:  Recent Labs Lab 02/08/15 0807 02/09/15 0807  GLUCAP 102* 87    Microbiology: No results found for this or any previous visit.  Coagulation Studies: No results for input(s): LABPROT, INR in the last 72 hours.  Imaging: Ct Abdomen Pelvis Wo Contrast  02/08/2015   CLINICAL DATA:  Generalized abdominal pain with nausea and vomiting.  EXAM: CT ABDOMEN AND PELVIS WITHOUT CONTRAST  TECHNIQUE: Multidetector CT imaging of the abdomen and pelvis was performed following the standard protocol without IV contrast.  COMPARISON:  01/29/2008  FINDINGS: Motion artifact with mild atelectasis in the lung bases. No pleural effusion. Three-vessel coronary artery calcification.  Multiple small gallstones are again seen. There is no biliary dilatation. The liver, spleen, adrenal glands, and pancreas are unremarkable. Multiple low-density renal lesions are again seen bilaterally compatible with cysts. The largest measures 9.4 cm in the right kidney and has mildly increased in size from the prior study. There are punctate, nonobstructing lower pole renal calculi bilaterally.  Diffuse aortoiliac atherosclerotic calcification is again seen. Displacement of minimal calcification centrally within the proximal abdominal aorta is unchanged and may reflect a short segment dissection. Infrarenal abdominal aortic aneurysm has mildly enlarged and  now measures 4.6 x 4.1 cm (previously 3.5 cm). The common iliac arteries measure up to 1.7 cm on the right and 1.8 cm on the left, stable to minimally increased from prior.  Predominantly left-sided colonic diverticulosis is present without evidence of diverticulitis. Appendix is unremarkable. There is no evidence of bowel obstruction.  There are bilateral fat containing inguinal hernias. The prostate remains prominently  enlarged, measuring 7.2 x 7.0 cm. There is moderate distention of the bladder. A 6 mm stone is present posteriorly in the bladder lumen, new from the prior study. No free fluid or enlarged lymph nodes are identified. Grade 1 anterolisthesis is again seen of L4 on L5 and appears facet mediated. Mild, chronic L2 compression fracture demonstrates minimally progressive height loss.  IMPRESSION: 1. Cholelithiasis. 2. Punctate nonobstructing bilateral renal calculi. 3. New 6 mm stone in the bladder. 4. 4.6 cm infrarenal abdominal aortic aneurysm, enlarged since 2009.   Electronically Signed   By: Logan Bores M.D.   On: 02/08/2015 18:32   Dg Chest 2 View  02/07/2015   CLINICAL DATA:  Change in mental status, multiple falls  EXAM: CHEST  2 VIEW  COMPARISON:  01/24/2015 chest radiograph and thoracic spine radiographs  FINDINGS: Evidence of CABG. Mild enlargement of the cardiomediastinal silhouette is noted. Lungs are hypoaerated with crowding of the bronchovascular markings. No new focal pulmonary opacity allowing for hypoaeration. No pleural effusions. Multiple lower thoracic central endplate depression deformities are reidentified, not significantly changed.  IMPRESSION: Low volume exam without focal acute finding.   Electronically Signed   By: Conchita Paris M.D.   On: 02/07/2015 18:07   Ct Head Wo Contrast  02/07/2015   CLINICAL DATA:  Fall.  EXAM: CT HEAD WITHOUT CONTRAST  CT CERVICAL SPINE WITHOUT CONTRAST  TECHNIQUE: Multidetector CT imaging of the head and cervical spine was performed following the standard protocol without intravenous contrast. Multiplanar CT image reconstructions of the cervical spine were also generated.  COMPARISON:  CT scan of cervical spine of January 24, 2015; CT scan of head of December 20, 2012.  FINDINGS: CT HEAD FINDINGS  Bony calvarium appears intact. Mild diffuse cortical atrophy is noted. Mild chronic ischemic white matter disease is noted. No mass effect or midline shift is noted.  Ventricular size is within normal limits. There is no evidence of mass lesion, hemorrhage or acute infarction.  CT CERVICAL SPINE FINDINGS  No fracture is noted. Moderate degenerative disc disease is noted at C3-4 and C4-5, with severe degenerative disc disease seen at C5-6, C6-7 C7-T1. Grade 1 anterolisthesis is noted at C3-4, C4-5 and C5-6 secondary to posterior facet joint hypertrophy. Visualized lung apices appear normal.  IMPRESSION: Mild diffuse cortical atrophy. Mild chronic ischemic white matter disease. No acute intracranial abnormality seen.  Moderate to severe multilevel degenerative disc disease is noted in the cervical spine. No acute abnormality is noted.   Electronically Signed   By: Marijo Conception, M.D.   On: 02/07/2015 17:48   Ct Cervical Spine Wo Contrast  02/07/2015   CLINICAL DATA:  Fall.  EXAM: CT HEAD WITHOUT CONTRAST  CT CERVICAL SPINE WITHOUT CONTRAST  TECHNIQUE: Multidetector CT imaging of the head and cervical spine was performed following the standard protocol without intravenous contrast. Multiplanar CT image reconstructions of the cervical spine were also generated.  COMPARISON:  CT scan of cervical spine of January 24, 2015; CT scan of head of December 20, 2012.  FINDINGS: CT HEAD FINDINGS  Bony calvarium appears intact. Mild diffuse cortical  atrophy is noted. Mild chronic ischemic white matter disease is noted. No mass effect or midline shift is noted. Ventricular size is within normal limits. There is no evidence of mass lesion, hemorrhage or acute infarction.  CT CERVICAL SPINE FINDINGS  No fracture is noted. Moderate degenerative disc disease is noted at C3-4 and C4-5, with severe degenerative disc disease seen at C5-6, C6-7 C7-T1. Grade 1 anterolisthesis is noted at C3-4, C4-5 and C5-6 secondary to posterior facet joint hypertrophy. Visualized lung apices appear normal.  IMPRESSION: Mild diffuse cortical atrophy. Mild chronic ischemic white matter disease. No acute intracranial  abnormality seen.  Moderate to severe multilevel degenerative disc disease is noted in the cervical spine. No acute abnormality is noted.   Electronically Signed   By: Marijo Conception, M.D.   On: 02/07/2015 17:48   US Scrotum  02/08/2015   CLINICAL DATA:  Testicular pain.  EXAM: SCROTAL ULTRASOUND  DOPPLER ULTRASOUND OF THE TESTICLES  TECHNIQUE: Complete ultrasound examination of the testicles, epididymis, and other scrotal structures was performed. Color and spectral Doppler ultrasound were also utilized to evaluate blood flow to the testicles.  COMPARISON:  None.  FINDINGS: Right testicle  Measurements: 2.4 x 2.4 x 1.9 cm. No mass or microlithiasis visualized.  Left testicle  Measurements: 1.9 x 1.6 x 1.1 cm. No mass or microlithiasis visualized.  Right epididymis:  4 mm cyst is noted.  Left epididymis:  7 mm cyst is noted.  Hydrocele:  Small right hydrocele is noted.  Varicocele:  Small left varicocele is noted.  Pulsed Doppler interrogation of both testes demonstrates normal low resistance arterial and venous waveforms bilaterally.  IMPRESSION: No evidence of testicular torsion or mass. Small bilateral epididymal cyst. Small right hydrocele. Small left varicocele.   Electronically Signed   By: Marijo Conception, M.D.   On: 02/08/2015 17:54   [place] Art/ven Flow Abd Pelv Doppler  02/08/2015   CLINICAL DATA:  Testicular pain.  EXAM: SCROTAL ULTRASOUND  DOPPLER ULTRASOUND OF THE TESTICLES  TECHNIQUE: Complete ultrasound examination of the testicles, epididymis, and other scrotal structures was performed. Color and spectral Doppler ultrasound were also utilized to evaluate blood flow to the testicles.  COMPARISON:  None.  FINDINGS: Right testicle  Measurements: 2.4 x 2.4 x 1.9 cm. No mass or microlithiasis visualized.  Left testicle  Measurements: 1.9 x 1.6 x 1.1 cm. No mass or microlithiasis visualized.  Right epididymis:  4 mm cyst is noted.  Left epididymis:  7 mm cyst is noted.  Hydrocele:  Small right hydrocele  is noted.  Varicocele:  Small left varicocele is noted.  Pulsed Doppler interrogation of both testes demonstrates normal low resistance arterial and venous waveforms bilaterally.  IMPRESSION: No evidence of testicular torsion or mass. Small bilateral epididymal cyst. Small right hydrocele. Small left varicocele.   Electronically Signed   By: Marijo Conception, M.D.   On: 02/08/2015 17:54   Dg Abd Portable 1v  02/08/2015   CLINICAL DATA:  Acute abdominal pain  EXAM: PORTABLE ABDOMEN - 1 VIEW  COMPARISON:  01/29/2008  FINDINGS: No evidence of bowel obstruction or perforation. No concerning calcification or mass effect. Atherosclerotic calcification.  IMPRESSION: Normal bowel gas pattern.   Electronically Signed   By: Monte Fantasia M.D.   On: 02/08/2015 16:24    Medications:  Scheduled: . aspirin  325 mg Oral Daily  .  ceFAZolin (ANCEF) IV  1 g Intravenous 3 times per day  . heparin subcutaneous  5,000 Units Subcutaneous 3  times per day  . levETIRAcetam  500 mg Intravenous Q12H  . pantoprazole (PROTONIX) IV  40 mg Intravenous Q24H  . sodium chloride  3 mL Intravenous Q12H  . tamsulosin  0.4 mg Oral QPC supper  . triamcinolone cream  1 application Topical BID    Assessment/Plan: 81y/o gentleman hx of seizures, prior CVA, HTN, HLD, CAD presenting with falls and altered mental status. History is pertinent for recent use of benadryl which appears to coincide with onset of above symptoms. Exam limited due to patient participation but overall appears non-focal. Suspect symptoms related to use of benadryl in an elderly patient.  Mental status has appeared to improve with discontinuation of benadryl. No further neurological workup indicated at this time.     LOS: 2 days   Jim Like, DO [facility] [phone]  If 7pm- 7am, please page neurology on call as listed in [place]. 02/09/2015  8:24 AM

## 2015-02-09 NOTE — Telephone Encounter (Signed)
The patient is now in the hospital for altered mental status, neurology has been called to evaluate/ FYI

## 2015-02-09 NOTE — Progress Notes (Signed)
[age] Larry Bradford Patient voided 20cc with PVR [id number] Thanks

## 2015-02-09 NOTE — Consult Note (Addendum)
CARDIOLOGY CONSULT NOTE     Primary Care Physician: Merrilee Seashore, MD Referring Physician:  hospitalist Primary Cardiologist: Caroline More Date: 02/07/2015  Reason for consultation:  tachycardia  Larry Bradford is a 81 y.o. male with a h/o moderate AS, HTN, and known ectopic atrial tachycardia now admitted with falls and AMS.  He has a workup ongoing for his primary hospitalization.  He has however been observed to have runs of abrupt onset/offset SVT on telemetry.  He is asymptomatic with this.  He states "my heart feels strong".  He denies palpitations, chest pain, shortness of breath, orthopnea, PND, lower extremity edema, dizziness, presyncope, syncope, or neurologic sequela.  He has been seen previously by Drs Debara Pickett and Spurgeon.  He has previously been diagnosed with atrial tachycardia with HRs 120s.  He has done well with conservative management for the past few years.   Past Medical History  Diagnosis Date  . Seizures     Onset in Sept 2006  . Cataract   . Hard of hearing   . Renal disorder   . Depression   . HTN (hypertension)   . BPH (benign prostatic hyperplasia)   . CAD (coronary artery disease)     s/p CABG in 2002  . Hyperlipidemia   . OSA (obstructive sleep apnea)   . Aortic aneurysm     3 CM in 2007  . Carotid stenosis   . DDD (degenerative disc disease)   . Trigeminal neuralgia   . Lower GI bleed   . Stroke   . Allergy   . Aortic stenosis    Past Surgical History  Procedure Laterality Date  . Coronary artery bypass graft  2002  . Temporal artery biopsy / ligation  2004    . aspirin  325 mg Oral Daily  . heparin subcutaneous  5,000 Units Subcutaneous 3 times per day  . levETIRAcetam  500 mg Oral BID  . metoprolol succinate  25 mg Oral Daily  . pantoprazole (PROTONIX) IV  40 mg Intravenous Q24H  . sodium chloride  3 mL Intravenous Q12H  . tamsulosin  0.4 mg Oral QPC supper  . triamcinolone cream  1 application Topical BID      No Known  Allergies  Social History   Social History  . Marital Status: Married    Spouse Name: N/A  . Number of Children: N/A  . Years of Education: N/A   Occupational History  . Not on file.   Social History Main Topics  . Smoking status: Never Smoker   . Smokeless tobacco: Never Used  . Alcohol Use: No  . Drug Use: No  . Sexual Activity: No   Other Topics Concern  . Not on file   Social History Narrative   reports that he has never smoked. He does not have any smokeless tobacco history on file. He reports that he does not drink alcohol. His drug history not on file.          Family History  Problem Relation Age of Onset  . CAD Brother   . Heart disease Brother   . Cancer Mother     ROS- All systems are reviewed and negative except as per the HPI above  Physical Exam: Telemetry: Filed Vitals:   02/08/15 2115 02/09/15 0105 02/09/15 0520 02/09/15 0932  BP: 142/88 136/84 108/53 130/73  Pulse: 115 112 71 113  Temp: 97.7 F (36.5 C) 97.6 F (36.4 C) 97.8 F (36.6 C) 97.7 F (36.5 C)  TempSrc:  Oral Oral Oral Oral  Resp: 15 16 16 18   SpO2: 96% 97% 94% 99%    GEN- The patient is elderly appearing, sleeping but rouses Head- normocephalic, atraumatic Eyes-  Sclera clear, conjunctiva pink Ears- hearing intact Oropharynx- clear Neck- supple,   Lungs- Clear to ausculation bilaterally, normal work of breathing Heart- Regular rate and rhythm, 2/6 SEM LUSB GI- soft, NT, ND, + BS Extremities- no clubbing, cyanosis, or edema MS- no significant deformity or atrophy Psych- sleeping but rouses  EKGs are reviewed:  Sinus with RBBB, second ekg reveals atrial tachycardia with RBBB Labs:   Lab Results  Component Value Date   WBC 7.5 02/09/2015   HGB 13.1 02/09/2015   HCT 40.6 02/09/2015   MCV 94.4 02/09/2015   PLT 200 02/09/2015    Recent Labs Lab 02/09/15 0700  NA 144  K 3.9  CL 109  CO2 26  BUN 19  CREATININE 1.13  CALCIUM 9.0  PROT <3.0*  BILITOT 0.8    ALKPHOS 58  ALT 18  AST 21  GLUCOSE 90   Lab Results  Component Value Date   CKTOTAL 111 06/19/2011   CKMB 3.1 06/19/2011   TROPONINI <0.03 02/08/2015    Lab Results  Component Value Date   CHOL 224* 02/08/2015   CHOL 156 12/21/2012   Lab Results  Component Value Date   HDL 39* 02/08/2015   HDL 35* 12/21/2012   Lab Results  Component Value Date   LDLCALC 149* 02/08/2015   LDLCALC 74 12/21/2012   Lab Results  Component Value Date   TRIG 179* 02/08/2015   TRIG 237* 12/21/2012   Lab Results  Component Value Date   CHOLHDL 5.7 02/08/2015   CHOLHDL 4.5 12/21/2012   No results found for: LDLDIRECT    Echo: pending, Echo from 2014 is reviewed  ASSESSMENT AND PLAN:   1. Ectopic atrial tachycardia Previously known and described by Dr Marlou Porch in 2014 office note.  Asymptomatic Would therefore manage conservatively. Will add metoprolol XL 25mg  daily, though with advanced age and baseline conduction system disease (RBBB), would not aggressively titrate.  No indication for anticoagulation.  May be better served off of telemetry.  2. Aortic stenosis Currently asymptomatic Echo pending Would plan conservative management long term  3. CAD No ischemic symptoms On asa Add low dose beta blocker as above  Cardiology team to see as needed while here. Follow-up with Dr Marlou Porch for routine care in the office Please call with questions.   Thompson Grayer, MD 02/09/2015  1:56 PM

## 2015-02-09 NOTE — Progress Notes (Addendum)
PATIENT DETAILS Name: Larry Bradford Age: 81 y.o. Sex: male Date of Birth: 01/23/1917 Admit Date: 02/07/2015 Admitting Physician Ivor Costa, MD WRU:[id number],NWGNF, MD  Summary: 81 year old male with history of seizure disorder, CVA, aortic stenosis, recently diagnosed with dermatitis and started on triamcinolone cream and Benadryl, brought to the hospital for evaluation of frequent falls and altered mental status.   Subjective: Awake and alert-no abd pain today. Hard of hearing  Assessment/Plan: Principal Problem: Acute encephalopathy: Suspect from Benadryl. Mental status improved.Neurology recommends no further work up-hence both MRI Brain and EEG discontinued.   Active Problems: Diffuse abdominal pain: much improved, CT Abd neg for acute abnormalities. Suspect this was from a distended bladder.Advance diet. Follow  Enlarged scrotum: likely chronic, Ultrasound with doppler neg for torsion.   Elevated d-dimer: Not hypoxic during my exam-in fact O2 saturation in the high 90s. Lungs are completely clear. Lower extremity Dopplers are negative. Low suspicion for pulmonary embolism. Patient/family refused CT angios/VQ scan lon admission. Suspect we could hold off on further investigations at this point.  ?Atrial Tachycardia: Cards consulted. Await recommendations  Acute Urinary Retention:continue Flomax-will ask RN to continue to monitor-supportive care-if worsens-place Foley  Dermatitis/skin rash: Per family this has been ongoing for the past 3 weeks-rash is generalized-patient has had follow-up with dermatologist (Dr. Ronnald Ramp) and was told that this is dermatitis him and was given triamcinolone ointment and Benadryl. Will continue triamcinolone, hold Benadryl, may need a short course of steroids.  History of chronic kidney disease stage III: Creatinine close to usual baseline, follow.  History of CAD: No chest pain or shortness of breath currently. Continue  aspirin  History of CVA: Nonfocal exam. Continue aspirin.  History of seizure disorder: Continue with Keppra.  History of Moderate aortic stenosis: Outpatient follow-up with primary cardiologist.  4.6 cm infrarenal abdominal aortic aneurysm, enlarged since 2009 per AO:ZHYQMV for outpatient follow up and monitoring  Disposition: Remain inpatient-SNF vs Home health services on discharge.  Antimicrobial agents  See below  Anti-infectives    Start     Dose/Rate Route Frequency Ordered Stop   02/08/15 1515  ceFAZolin (ANCEF) IVPB 1 g/50 mL premix  Status:  Discontinued     1 g 100 mL/hr over 30 Minutes Intravenous 3 times per day 02/08/15 1513 02/09/15 1122   02/07/15 2200  cephALEXin (KEFLEX) capsule 500 mg  Status:  Discontinued     500 mg Oral 3 times daily 02/07/15 2047 02/08/15 1512      DVT Prophylaxis: Prophylactic Heparin   Code Status: Full code   Family Communication None at bedside  Procedures: None  CONSULTS:  neurology   Cards  Time spent 30 minutes-Greater than 50% of this time was spent in counseling, explanation of diagnosis, planning of further management, and coordination of care.  MEDICATIONS: Scheduled Meds: . aspirin  325 mg Oral Daily  . heparin subcutaneous  5,000 Units Subcutaneous 3 times per day  . levETIRAcetam  500 mg Oral BID  . pantoprazole (PROTONIX) IV  40 mg Intravenous Q24H  . sodium chloride  3 mL Intravenous Q12H  . tamsulosin  0.4 mg Oral QPC supper  . triamcinolone cream  1 application Topical BID   Continuous Infusions:   PRN Meds:.acetaminophen, ondansetron (ZOFRAN) IV    PHYSICAL EXAM: Vital signs in last 24 hours: Filed Vitals:   02/08/15 2115 02/09/15 0105 02/09/15 0520 02/09/15 0932  BP: 142/88 136/84 108/53 130/73  Pulse:  115 112 71 113  Temp: 97.7 F (36.5 C) 97.6 F (36.4 C) 97.8 F (36.6 C) 97.7 F (36.5 C)  TempSrc: Oral Oral Oral Oral  Resp: 15 16 16 18   SpO2: 96% 97% 94% 99%    Weight change:   There were no vitals filed for this visit. There is no weight on file to calculate BMI.   Gen Exam: Awake and alert with clear speech.   Neck: Supple, No JVD.  Chest: B/L Clear.   CVS: S1 S2 Regular, +syst murmur Abdomen: soft, BS +, Non tender, non distended.  Extremities: no edema, lower extremities warm to touch. Neurologic: Non Focal.   Wounds: N/A.    Intake/Output from previous day:  Intake/Output Summary (Last 24 hours) at 02/09/15 1303 Last data filed at 02/08/15 1910  Gross per 24 hour  Intake      0 ml  Output    550 ml  Net   -550 ml     LAB RESULTS: CBC  Recent Labs Lab 02/07/15 1748 02/08/15 0614 02/08/15 1640 02/09/15 0700  WBC 8.4 7.3 7.3 7.5  HGB 15.0 15.5 15.2 13.1  HCT 45.5 46.2 45.9 40.6  PLT 192 170 212 200  MCV 95.0 96.0 93.9 94.4  MCH 31.3 32.2 31.1 30.5  MCHC 33.0 33.5 33.1 32.3  RDW 13.5 13.5 13.4 13.5  LYMPHSABS 1.2  --   --  1.4  MONOABS 0.5  --   --  0.6  EOSABS 0.3  --   --  0.3  BASOSABS 0.0  --   --  0.0    Chemistries   Recent Labs Lab 02/07/15 1748 02/08/15 0614 02/08/15 1640 02/09/15 0700  NA 145 141 140 144  K 4.4 4.6 4.1 3.9  CL 109 110 104 109  CO2 30 20* 27 26  GLUCOSE 134* 93 109* 90  BUN 25* 22* 18 19  CREATININE 1.35* 1.14 1.23 1.13  CALCIUM 9.6 9.0 9.3 9.0    CBG:  Recent Labs Lab 02/08/15 0807 02/09/15 0807  GLUCAP 102* 87    GFR CrCl cannot be calculated (Unknown ideal weight.).  Coagulation profile No results for input(s): INR, PROTIME in the last 168 hours.  Cardiac Enzymes  Recent Labs Lab 02/07/15 2154 02/08/15 0249 02/08/15 1117  TROPONINI <0.03 <0.03 <0.03    Invalid input(s): POCBNP  Recent Labs  02/07/15 1733  DDIMER 3.58*   No results for input(s): HGBA1C in the last 72 hours.  Recent Labs  02/08/15 0614  CHOL 224*  HDL 39*  LDLCALC 149*  TRIG 179*  CHOLHDL 5.7   No results for input(s): TSH, T4TOTAL, T3FREE, THYROIDAB in the last 72 hours.  Invalid  input(s): FREET3 No results for input(s): VITAMINB12, FOLATE, FERRITIN, TIBC, IRON, RETICCTPCT in the last 72 hours.  Recent Labs  02/08/15 1640  LIPASE 39    Urine Studies No results for input(s): UHGB, CRYS in the last 72 hours.  Invalid input(s): UACOL, UAPR, USPG, UPH, UTP, UGL, UKET, UBIL, UNIT, UROB, ULEU, UEPI, UWBC, URBC, UBAC, CAST, UCOM, BILUA  MICROBIOLOGY: No results found for this or any previous visit (from the past 240 hour(s)).  RADIOLOGY STUDIES/RESULTS: Ct Abdomen Pelvis Wo Contrast  02/08/2015   CLINICAL DATA:  Generalized abdominal pain with nausea and vomiting.  EXAM: CT ABDOMEN AND PELVIS WITHOUT CONTRAST  TECHNIQUE: Multidetector CT imaging of the abdomen and pelvis was performed following the standard protocol without IV contrast.  COMPARISON:  01/29/2008  FINDINGS: Motion artifact with mild  atelectasis in the lung bases. No pleural effusion. Three-vessel coronary artery calcification.  Multiple small gallstones are again seen. There is no biliary dilatation. The liver, spleen, adrenal glands, and pancreas are unremarkable. Multiple low-density renal lesions are again seen bilaterally compatible with cysts. The largest measures 9.4 cm in the right kidney and has mildly increased in size from the prior study. There are punctate, nonobstructing lower pole renal calculi bilaterally.  Diffuse aortoiliac atherosclerotic calcification is again seen. Displacement of minimal calcification centrally within the proximal abdominal aorta is unchanged and may reflect a short segment dissection. Infrarenal abdominal aortic aneurysm has mildly enlarged and now measures 4.6 x 4.1 cm (previously 3.5 cm). The common iliac arteries measure up to 1.7 cm on the right and 1.8 cm on the left, stable to minimally increased from prior.  Predominantly left-sided colonic diverticulosis is present without evidence of diverticulitis. Appendix is unremarkable. There is no evidence of bowel obstruction.   There are bilateral fat containing inguinal hernias. The prostate remains prominently enlarged, measuring 7.2 x 7.0 cm. There is moderate distention of the bladder. A 6 mm stone is present posteriorly in the bladder lumen, new from the prior study. No free fluid or enlarged lymph nodes are identified. Grade 1 anterolisthesis is again seen of L4 on L5 and appears facet mediated. Mild, chronic L2 compression fracture demonstrates minimally progressive height loss.  IMPRESSION: 1. Cholelithiasis. 2. Punctate nonobstructing bilateral renal calculi. 3. New 6 mm stone in the bladder. 4. 4.6 cm infrarenal abdominal aortic aneurysm, enlarged since 2009.   Electronically Signed   By: Logan Bores M.D.   On: 02/08/2015 18:32   Dg Chest 2 View  02/07/2015   CLINICAL DATA:  Change in mental status, multiple falls  EXAM: CHEST  2 VIEW  COMPARISON:  01/24/2015 chest radiograph and thoracic spine radiographs  FINDINGS: Evidence of CABG. Mild enlargement of the cardiomediastinal silhouette is noted. Lungs are hypoaerated with crowding of the bronchovascular markings. No new focal pulmonary opacity allowing for hypoaeration. No pleural effusions. Multiple lower thoracic central endplate depression deformities are reidentified, not significantly changed.  IMPRESSION: Low volume exam without focal acute finding.   Electronically Signed   By: Conchita Paris M.D.   On: 02/07/2015 18:07   Dg Ribs Unilateral W/chest Left  01/24/2015   CLINICAL DATA:  Pain on the left side.  Initial encounter.  EXAM: LEFT RIBS AND CHEST - 3+ VIEW  COMPARISON:  01/07/2013  FINDINGS: Hypoventilation with interstitial crowding at the bases. Stable cardiomegaly and aortic contours in this patient post CABG.  Underpenetration limits evaluation of the lower ribs, especially below rib 7. No appreciable fracture or rib lesion.  There is no edema, consolidation, effusion, or pneumothorax.  IMPRESSION: 1. No evidence of left rib fracture. 2. Technical factors  significantly limit lower rib visualization. 3. No evidence of pneumothorax or hemothorax.   Electronically Signed   By: Monte Fantasia M.D.   On: 01/24/2015 15:47   Dg Cervical Spine 2 Or 3 Views  01/24/2015   CLINICAL DATA:  Neck pain  EXAM: CERVICAL SPINE - 2-3 VIEW  COMPARISON:  None.  FINDINGS: The C1 through C6 vertebral bodies are well visualized. C7 and T1 vertebral bodies are obscured by overlying osseous and soft tissue structures.  Degenerative changes are seen throughout the cervical spine, at least moderate in degree throughout, with associated disc space narrowings and osseous spurring. There is a minimal anterolisthesis of C3, likely related to the underlying degenerative changes. Alignment of  the C1 through C6 vertebral bodies appear otherwise normal. No fracture line or displaced fracture fragment identified. Posterior elements of the cervical spine appear grossly well aligned throughout.  There is some irregularity at the cervical thoracic junction which is poorly seen due to overlying osseous and soft tissue structures. Fracture or dislocation cannot be excluded at these levels.  IMPRESSION: Limited study, as detailed above. C7 vertebral body and upper thoracic vertebral bodies are poorly seen due to overlying osseous and soft tissue structures. There may be a slight deformity/displacement of an upper thoracic vertebral body but not convincing. If symptomatic at these levels, would recommend CT for more definitive characterization.  C1 through C6 vertebral bodies appear intact without evidence of acute fracture or subluxation.  Fairly extensive degenerative changes throughout the cervical spine.   Electronically Signed   By: Franki Cabot M.D.   On: 01/24/2015 15:50   Dg Thoracic Spine 2 View  01/24/2015   CLINICAL DATA:  Thoracic pain following fall 2 weeks ago. Initial encounter.  EXAM: THORACIC SPINE 2 VIEWS  COMPARISON:  02/2013 and prior chest radiographs  FINDINGS: There is no  evidence of acute thoracic spine fracture or subluxation.  Mild multilevel degenerative disc disease noted.  No focal bony lesions are identified.  IMPRESSION: No acute thoracic spine abnormality.   Electronically Signed   By: Margarette Canada M.D.   On: 01/24/2015 15:46   Ct Head Wo Contrast  02/07/2015   CLINICAL DATA:  Fall.  EXAM: CT HEAD WITHOUT CONTRAST  CT CERVICAL SPINE WITHOUT CONTRAST  TECHNIQUE: Multidetector CT imaging of the head and cervical spine was performed following the standard protocol without intravenous contrast. Multiplanar CT image reconstructions of the cervical spine were also generated.  COMPARISON:  CT scan of cervical spine of January 24, 2015; CT scan of head of December 20, 2012.  FINDINGS: CT HEAD FINDINGS  Bony calvarium appears intact. Mild diffuse cortical atrophy is noted. Mild chronic ischemic white matter disease is noted. No mass effect or midline shift is noted. Ventricular size is within normal limits. There is no evidence of mass lesion, hemorrhage or acute infarction.  CT CERVICAL SPINE FINDINGS  No fracture is noted. Moderate degenerative disc disease is noted at C3-4 and C4-5, with severe degenerative disc disease seen at C5-6, C6-7 C7-T1. Grade 1 anterolisthesis is noted at C3-4, C4-5 and C5-6 secondary to posterior facet joint hypertrophy. Visualized lung apices appear normal.  IMPRESSION: Mild diffuse cortical atrophy. Mild chronic ischemic white matter disease. No acute intracranial abnormality seen.  Moderate to severe multilevel degenerative disc disease is noted in the cervical spine. No acute abnormality is noted.   Electronically Signed   By: Marijo Conception, M.D.   On: 02/07/2015 17:48   Ct Cervical Spine Wo Contrast  02/07/2015   CLINICAL DATA:  Fall.  EXAM: CT HEAD WITHOUT CONTRAST  CT CERVICAL SPINE WITHOUT CONTRAST  TECHNIQUE: Multidetector CT imaging of the head and cervical spine was performed following the standard protocol without intravenous contrast.  Multiplanar CT image reconstructions of the cervical spine were also generated.  COMPARISON:  CT scan of cervical spine of January 24, 2015; CT scan of head of December 20, 2012.  FINDINGS: CT HEAD FINDINGS  Bony calvarium appears intact. Mild diffuse cortical atrophy is noted. Mild chronic ischemic white matter disease is noted. No mass effect or midline shift is noted. Ventricular size is within normal limits. There is no evidence of mass lesion, hemorrhage or acute infarction.  CT  CERVICAL SPINE FINDINGS  No fracture is noted. Moderate degenerative disc disease is noted at C3-4 and C4-5, with severe degenerative disc disease seen at C5-6, C6-7 C7-T1. Grade 1 anterolisthesis is noted at C3-4, C4-5 and C5-6 secondary to posterior facet joint hypertrophy. Visualized lung apices appear normal.  IMPRESSION: Mild diffuse cortical atrophy. Mild chronic ischemic white matter disease. No acute intracranial abnormality seen.  Moderate to severe multilevel degenerative disc disease is noted in the cervical spine. No acute abnormality is noted.   Electronically Signed   By: Marijo Conception, M.D.   On: 02/07/2015 17:48   Ct Cervical Spine Wo Contrast  01/24/2015   CLINICAL DATA:  Patient fell approximate 1 month ago walking home. Complaining of left lateral neck and shoulder pain.  EXAM: CT CERVICAL SPINE WITHOUT CONTRAST  CT THORACIC SPINE WITHOUT CONTRAST  TECHNIQUE: Multidetector CT imaging of the cervical and thoracic spine was performed without contrast. Multiplanar CT image reconstructions were also generated.  COMPARISON:  Current cervical and thoracic radiographs  FINDINGS: CT CERVICAL SPINE FINDINGS  No fracture. No significant spondylolisthesis. There is a reversal of the normal cervical lordosis centered at C6. Marked loss of disc height at C5-C6 and C7-T1. Moderate loss disc height at C4-C5 and C6-C7. Mild loss disc height at C3-C4. There are facet degenerative changes evident greater on the left from C2-C3 through  C5-C6. There are varying degrees of neural foraminal narrowing from uncovertebral and facet spurring. This is greatest on the left at C3-C4, moderate. There is mild central stenosis at C6-C7.  Bones are diffusely demineralized.  There are carotid vascular calcifications bilaterally. Soft tissues otherwise unremarkable.  CT THORACIC SPINE FINDINGS  There is normal vertebral body stature and alignment. No fractures. There are mild disc degenerative changes along the mid and upper thoracic spine reflected by minor loss disc height and small endplate osteophytes. No significant disc bulging. No evidence of a disc herniation. Neural foramina and central spinal canal well preserved.  Surrounding soft tissues show aortic and branch vessel vascular calcifications. Possible partly imaged right renal cyst. No masses or adenopathy. No acute findings in the visualize lungs.  IMPRESSION: CERVICAL CT: No acute fracture or acute finding. There are significant degenerative changes throughout the cervical spine.  THORACIC CT:  No fracture or acute finding.   Electronically Signed   By: Lajean Manes M.D.   On: 01/24/2015 17:27   Ct Thoracic Spine Wo Contrast  01/24/2015   CLINICAL DATA:  Patient fell approximate 1 month ago walking home. Complaining of left lateral neck and shoulder pain.  EXAM: CT CERVICAL SPINE WITHOUT CONTRAST  CT THORACIC SPINE WITHOUT CONTRAST  TECHNIQUE: Multidetector CT imaging of the cervical and thoracic spine was performed without contrast. Multiplanar CT image reconstructions were also generated.  COMPARISON:  Current cervical and thoracic radiographs  FINDINGS: CT CERVICAL SPINE FINDINGS  No fracture. No significant spondylolisthesis. There is a reversal of the normal cervical lordosis centered at C6. Marked loss of disc height at C5-C6 and C7-T1. Moderate loss disc height at C4-C5 and C6-C7. Mild loss disc height at C3-C4. There are facet degenerative changes evident greater on the left from C2-C3  through C5-C6. There are varying degrees of neural foraminal narrowing from uncovertebral and facet spurring. This is greatest on the left at C3-C4, moderate. There is mild central stenosis at C6-C7.  Bones are diffusely demineralized.  There are carotid vascular calcifications bilaterally. Soft tissues otherwise unremarkable.  CT THORACIC SPINE FINDINGS  There is normal  vertebral body stature and alignment. No fractures. There are mild disc degenerative changes along the mid and upper thoracic spine reflected by minor loss disc height and small endplate osteophytes. No significant disc bulging. No evidence of a disc herniation. Neural foramina and central spinal canal well preserved.  Surrounding soft tissues show aortic and branch vessel vascular calcifications. Possible partly imaged right renal cyst. No masses or adenopathy. No acute findings in the visualize lungs.  IMPRESSION: CERVICAL CT: No acute fracture or acute finding. There are significant degenerative changes throughout the cervical spine.  THORACIC CT:  No fracture or acute finding.   Electronically Signed   By: Lajean Manes M.D.   On: 01/24/2015 17:27   US Scrotum  02/08/2015   CLINICAL DATA:  Testicular pain.  EXAM: SCROTAL ULTRASOUND  DOPPLER ULTRASOUND OF THE TESTICLES  TECHNIQUE: Complete ultrasound examination of the testicles, epididymis, and other scrotal structures was performed. Color and spectral Doppler ultrasound were also utilized to evaluate blood flow to the testicles.  COMPARISON:  None.  FINDINGS: Right testicle  Measurements: 2.4 x 2.4 x 1.9 cm. No mass or microlithiasis visualized.  Left testicle  Measurements: 1.9 x 1.6 x 1.1 cm. No mass or microlithiasis visualized.  Right epididymis:  4 mm cyst is noted.  Left epididymis:  7 mm cyst is noted.  Hydrocele:  Small right hydrocele is noted.  Varicocele:  Small left varicocele is noted.  Pulsed Doppler interrogation of both testes demonstrates normal low resistance arterial and  venous waveforms bilaterally.  IMPRESSION: No evidence of testicular torsion or mass. Small bilateral epididymal cyst. Small right hydrocele. Small left varicocele.   Electronically Signed   By: Marijo Conception, M.D.   On: 02/08/2015 17:54   [place] Art/ven Flow Abd Pelv Doppler  02/08/2015   CLINICAL DATA:  Testicular pain.  EXAM: SCROTAL ULTRASOUND  DOPPLER ULTRASOUND OF THE TESTICLES  TECHNIQUE: Complete ultrasound examination of the testicles, epididymis, and other scrotal structures was performed. Color and spectral Doppler ultrasound were also utilized to evaluate blood flow to the testicles.  COMPARISON:  None.  FINDINGS: Right testicle  Measurements: 2.4 x 2.4 x 1.9 cm. No mass or microlithiasis visualized.  Left testicle  Measurements: 1.9 x 1.6 x 1.1 cm. No mass or microlithiasis visualized.  Right epididymis:  4 mm cyst is noted.  Left epididymis:  7 mm cyst is noted.  Hydrocele:  Small right hydrocele is noted.  Varicocele:  Small left varicocele is noted.  Pulsed Doppler interrogation of both testes demonstrates normal low resistance arterial and venous waveforms bilaterally.  IMPRESSION: No evidence of testicular torsion or mass. Small bilateral epididymal cyst. Small right hydrocele. Small left varicocele.   Electronically Signed   By: Marijo Conception, M.D.   On: 02/08/2015 17:54   Dg Abd Portable 1v  02/08/2015   CLINICAL DATA:  Acute abdominal pain  EXAM: PORTABLE ABDOMEN - 1 VIEW  COMPARISON:  01/29/2008  FINDINGS: No evidence of bowel obstruction or perforation. No concerning calcification or mass effect. Atherosclerotic calcification.  IMPRESSION: Normal bowel gas pattern.   Electronically Signed   By: Monte Fantasia M.D.   On: 02/08/2015 16:24    Oren Binet, MD  [facility] Pager:[phone]  If 7PM-7AM, please contact night-coverage [email or web address] Password [email or web address] 02/09/2015, 1:03 PM   LOS: 2 days

## 2015-02-09 NOTE — Progress Notes (Signed)
MD notified of Bladder scan and continued inability to void. MD wants staff to continue to assist patient to attempt to void. MD states he will check on patient status after lunch.

## 2015-02-09 NOTE — Progress Notes (Signed)
  Echocardiogram 2D Echocardiogram has been performed.  Larry Bradford 02/09/2015, 2:56 PM

## 2015-02-10 LAB — ROCKY MTN SPOTTED FVR ABS PNL(IGG+IGM)
RMSF IGG: NEGATIVE
RMSF IgM: 0.18 index (ref 0.00–0.89)

## 2015-02-10 LAB — GLUCOSE, CAPILLARY: GLUCOSE-CAPILLARY: 105 mg/dL — AB (ref 65–99)

## 2015-02-10 LAB — LEVETIRACETAM LEVEL: LEVETIRACETAM: 2.3 ug/mL — AB (ref 10.0–40.0)

## 2015-02-10 MED ORDER — LIDOCAINE HCL 2 % EX GEL
1.0000 "application " | Freq: Once | CUTANEOUS | Status: DC
  Filled 2015-02-10 (×2): qty 5

## 2015-02-10 MED ORDER — FINASTERIDE 5 MG PO TABS: 5.0000 mg | ORAL_TABLET | Freq: Every day | ORAL | Status: AC

## 2015-02-10 MED ORDER — PANTOPRAZOLE SODIUM 40 MG PO TBEC
40.0000 mg | DELAYED_RELEASE_TABLET | Freq: Every day | ORAL | Status: DC
  Administered 2015-02-10 – 2015-02-11 (×2): 40 mg via ORAL
  Filled 2015-02-10 (×2): qty 1

## 2015-02-10 MED ORDER — FINASTERIDE 5 MG PO TABS
5.0000 mg | ORAL_TABLET | Freq: Every day | ORAL | Status: DC
  Administered 2015-02-10 – 2015-02-12 (×3): 5 mg via ORAL
  Filled 2015-02-10 (×3): qty 1

## 2015-02-10 MED ORDER — METOPROLOL SUCCINATE ER 25 MG PO TB24: 25.0000 mg | ORAL_TABLET | Freq: Every day | ORAL | Status: AC

## 2015-02-10 MED ORDER — TAMSULOSIN HCL 0.4 MG PO CAPS: 0.4000 mg | ORAL_CAPSULE | Freq: Every day | ORAL | Status: DC

## 2015-02-10 NOTE — Progress Notes (Signed)
Pt unable to void during shift. Bladder scan volume of 35ml. MD paged per order. Pt in and out cathed per protocol with 412ml of amber urine. Will continue to monitor. Bobbye Charleston, RN

## 2015-02-10 NOTE — Progress Notes (Signed)
Patient unable to tolerate current foley catheter, c/o extreme pain at penile site. Bright red urine noted in catheter bag. Foley irrigate with multiple clots noted. Current foley catheter d/c'ed and will reinsert 22" F with lidocaine lub. Will continue to monitor.   Ave Filter, RN

## 2015-02-10 NOTE — Progress Notes (Signed)
Patient ID: Larry Bradford, male   DOB: 12-Apr-1917, 81 y.o.   MRN: [id number]  I returned to check on Larry Bradford and the foley was obstructed again.    He was irrigated with about 1519ml of NS with return of clots and eventual clearing of the urine to a blush pink.   The balloon was filled to 33ml to try to maintain better positioning.     Need for prn irrigation reinforced.

## 2015-02-10 NOTE — Care Management Important Message (Signed)
Important Message  Patient Details  Name: Larry Bradford MRN: [id number] Date of Birth: 08/01/1916   Medicare Important Message Given:  Yes-second notification given    Loann Quill 02/10/2015, 12:46 PM

## 2015-02-10 NOTE — Discharge Summary (Addendum)
PATIENT DETAILS Name: Larry Bradford Age: 81 y.o. Sex: male Date of Birth: [birth year] MRN: [id number]. Admitting Physician: Ivor Costa, MD [id number], MD  Admit Date: 02/07/2015 Discharge date: 02/12/2015  Recommendations for Outpatient Follow-up:  1. Please ensure follow-up with urology for outpatient voiding trial-Foley catheter inserted on 9/12 and started on Flomax and finasteride. 2. Please ensure follow-up with dermatology-Dr. Ronnald Ramp. 3. Please repeat CBC/BMET in the next 1-2 weeks 4. Has a 4.6 cm infrarenal normal aortic aneurysm-Will need periodic screening. Defer to PCP  PRIMARY DISCHARGE DIAGNOSIS:  Principal Problem:   Fall Active Problems:   Hypertension   Seizures   CKD (chronic kidney disease), stage III   BPH (benign prostatic hyperplasia)   CAD (coronary artery disease)   Hyperlipidemia   Carotid stenosis   Aortic valve disorder   History of stroke   Tachycardia   Rash   Acute encephalopathy   Altered mental status   Atrial tachycardia   Coronary artery disease involving coronary bypass graft of native heart without angina pectoris      PAST MEDICAL HISTORY: Past Medical History  Diagnosis Date  . Seizures     Onset in Sept 2006  . Cataract   . Hard of hearing   . Renal disorder   . Depression   . HTN (hypertension)   . BPH (benign prostatic hyperplasia)   . CAD (coronary artery disease)     s/p CABG in 2002  . Hyperlipidemia   . OSA (obstructive sleep apnea)   . Aortic aneurysm     3 CM in 2007  . Carotid stenosis   . DDD (degenerative disc disease)   . Trigeminal neuralgia   . Lower GI bleed   . Stroke   . Allergy   . Aortic stenosis     DISCHARGE MEDICATIONS: Current Discharge Medication List    START taking these medications   Details  finasteride (PROSCAR) 5 MG tablet Take 1 tablet (5 mg total) by mouth daily.    metoprolol succinate (TOPROL-XL) 25 MG 24 hr tablet Take 1 tablet (25 mg total) by mouth daily.      tamsulosin (FLOMAX) 0.4 MG CAPS capsule Take 1 capsule (0.4 mg total) by mouth daily after supper.      CONTINUE these medications which have CHANGED   Details  aspirin EC 81 MG tablet Take 1 tablet (81 mg total) by mouth daily.      CONTINUE these medications which have NOT CHANGED   Details  acetaminophen (TYLENOL) 500 MG tablet Take 500 mg by mouth every 6 (six) hours as needed for moderate pain.    diphenhydrAMINE-zinc acetate (BENADRYL) cream Apply 1 application topically 3 (three) times daily as needed for itching.    levETIRAcetam (KEPPRA) 500 MG tablet Take 1 tablet (500 mg total) by mouth every 12 (twelve) hours. Qty: 30 tablet, Refills: 0    triamcinolone cream (KENALOG) 0.1 % Apply 1 application topically 2 (two) times daily. Qty: 30 g, Refills: 0      STOP taking these medications     diphenhydrAMINE (BENADRYL) 25 MG tablet      cephALEXin (KEFLEX) 500 MG capsule      LORazepam (ATIVAN) 1 MG tablet      polyvinyl alcohol (LIQUIFILM TEARS) 1.4 % ophthalmic solution         ALLERGIES:  No Known Allergies  BRIEF HPI:  See H&P, Labs, Consult and Test reports for all details in brief, patient is a 81 year old male who was admitted  for evaluation of altered mental status.  CONSULTATIONS:   cardiology and neurology   Urology  PERTINENT RADIOLOGIC STUDIES: Ct Abdomen Pelvis Wo Contrast  02/08/2015   CLINICAL DATA:  Generalized abdominal pain with nausea and vomiting.  EXAM: CT ABDOMEN AND PELVIS WITHOUT CONTRAST  TECHNIQUE: Multidetector CT imaging of the abdomen and pelvis was performed following the standard protocol without IV contrast.  COMPARISON:  01/29/2008  FINDINGS: Motion artifact with mild atelectasis in the lung bases. No pleural effusion. Three-vessel coronary artery calcification.  Multiple small gallstones are again seen. There is no biliary dilatation. The liver, spleen, adrenal glands, and pancreas are unremarkable. Multiple low-density renal  lesions are again seen bilaterally compatible with cysts. The largest measures 9.4 cm in the right kidney and has mildly increased in size from the prior study. There are punctate, nonobstructing lower pole renal calculi bilaterally.  Diffuse aortoiliac atherosclerotic calcification is again seen. Displacement of minimal calcification centrally within the proximal abdominal aorta is unchanged and may reflect a short segment dissection. Infrarenal abdominal aortic aneurysm has mildly enlarged and now measures 4.6 x 4.1 cm (previously 3.5 cm). The common iliac arteries measure up to 1.7 cm on the right and 1.8 cm on the left, stable to minimally increased from prior.  Predominantly left-sided colonic diverticulosis is present without evidence of diverticulitis. Appendix is unremarkable. There is no evidence of bowel obstruction.  There are bilateral fat containing inguinal hernias. The prostate remains prominently enlarged, measuring 7.2 x 7.0 cm. There is moderate distention of the bladder. A 6 mm stone is present posteriorly in the bladder lumen, new from the prior study. No free fluid or enlarged lymph nodes are identified. Grade 1 anterolisthesis is again seen of L4 on L5 and appears facet mediated. Mild, chronic L2 compression fracture demonstrates minimally progressive height loss.  IMPRESSION: 1. Cholelithiasis. 2. Punctate nonobstructing bilateral renal calculi. 3. New 6 mm stone in the bladder. 4. 4.6 cm infrarenal abdominal aortic aneurysm, enlarged since 2009.   Electronically Signed   By: Logan Bores M.D.   On: 02/08/2015 18:32   Dg Chest 2 View  02/07/2015   CLINICAL DATA:  Change in mental status, multiple falls  EXAM: CHEST  2 VIEW  COMPARISON:  01/24/2015 chest radiograph and thoracic spine radiographs  FINDINGS: Evidence of CABG. Mild enlargement of the cardiomediastinal silhouette is noted. Lungs are hypoaerated with crowding of the bronchovascular markings. No new focal pulmonary opacity allowing  for hypoaeration. No pleural effusions. Multiple lower thoracic central endplate depression deformities are reidentified, not significantly changed.  IMPRESSION: Low volume exam without focal acute finding.   Electronically Signed   By: Conchita Paris M.D.   On: 02/07/2015 18:07   Dg Ribs Unilateral W/chest Left  01/24/2015   CLINICAL DATA:  Pain on the left side.  Initial encounter.  EXAM: LEFT RIBS AND CHEST - 3+ VIEW  COMPARISON:  01/07/2013  FINDINGS: Hypoventilation with interstitial crowding at the bases. Stable cardiomegaly and aortic contours in this patient post CABG.  Underpenetration limits evaluation of the lower ribs, especially below rib 7. No appreciable fracture or rib lesion.  There is no edema, consolidation, effusion, or pneumothorax.  IMPRESSION: 1. No evidence of left rib fracture. 2. Technical factors significantly limit lower rib visualization. 3. No evidence of pneumothorax or hemothorax.   Electronically Signed   By: Monte Fantasia M.D.   On: 01/24/2015 15:47   Dg Cervical Spine 2 Or 3 Views  01/24/2015   CLINICAL DATA:  Neck  pain  EXAM: CERVICAL SPINE - 2-3 VIEW  COMPARISON:  None.  FINDINGS: The C1 through C6 vertebral bodies are well visualized. C7 and T1 vertebral bodies are obscured by overlying osseous and soft tissue structures.  Degenerative changes are seen throughout the cervical spine, at least moderate in degree throughout, with associated disc space narrowings and osseous spurring. There is a minimal anterolisthesis of C3, likely related to the underlying degenerative changes. Alignment of the C1 through C6 vertebral bodies appear otherwise normal. No fracture line or displaced fracture fragment identified. Posterior elements of the cervical spine appear grossly well aligned throughout.  There is some irregularity at the cervical thoracic junction which is poorly seen due to overlying osseous and soft tissue structures. Fracture or dislocation cannot be excluded at these  levels.  IMPRESSION: Limited study, as detailed above. C7 vertebral body and upper thoracic vertebral bodies are poorly seen due to overlying osseous and soft tissue structures. There may be a slight deformity/displacement of an upper thoracic vertebral body but not convincing. If symptomatic at these levels, would recommend CT for more definitive characterization.  C1 through C6 vertebral bodies appear intact without evidence of acute fracture or subluxation.  Fairly extensive degenerative changes throughout the cervical spine.   Electronically Signed   By: Franki Cabot M.D.   On: 01/24/2015 15:50   Dg Thoracic Spine 2 View  01/24/2015   CLINICAL DATA:  Thoracic pain following fall 2 weeks ago. Initial encounter.  EXAM: THORACIC SPINE 2 VIEWS  COMPARISON:  02/2013 and prior chest radiographs  FINDINGS: There is no evidence of acute thoracic spine fracture or subluxation.  Mild multilevel degenerative disc disease noted.  No focal bony lesions are identified.  IMPRESSION: No acute thoracic spine abnormality.   Electronically Signed   By: Margarette Canada M.D.   On: 01/24/2015 15:46   Ct Head Wo Contrast  02/07/2015   CLINICAL DATA:  Fall.  EXAM: CT HEAD WITHOUT CONTRAST  CT CERVICAL SPINE WITHOUT CONTRAST  TECHNIQUE: Multidetector CT imaging of the head and cervical spine was performed following the standard protocol without intravenous contrast. Multiplanar CT image reconstructions of the cervical spine were also generated.  COMPARISON:  CT scan of cervical spine of January 24, 2015; CT scan of head of December 20, 2012.  FINDINGS: CT HEAD FINDINGS  Bony calvarium appears intact. Mild diffuse cortical atrophy is noted. Mild chronic ischemic white matter disease is noted. No mass effect or midline shift is noted. Ventricular size is within normal limits. There is no evidence of mass lesion, hemorrhage or acute infarction.  CT CERVICAL SPINE FINDINGS  No fracture is noted. Moderate degenerative disc disease is noted at  C3-4 and C4-5, with severe degenerative disc disease seen at C5-6, C6-7 C7-T1. Grade 1 anterolisthesis is noted at C3-4, C4-5 and C5-6 secondary to posterior facet joint hypertrophy. Visualized lung apices appear normal.  IMPRESSION: Mild diffuse cortical atrophy. Mild chronic ischemic white matter disease. No acute intracranial abnormality seen.  Moderate to severe multilevel degenerative disc disease is noted in the cervical spine. No acute abnormality is noted.   Electronically Signed   By: Marijo Conception, M.D.   On: 02/07/2015 17:48   Ct Cervical Spine Wo Contrast  02/07/2015   CLINICAL DATA:  Fall.  EXAM: CT HEAD WITHOUT CONTRAST  CT CERVICAL SPINE WITHOUT CONTRAST  TECHNIQUE: Multidetector CT imaging of the head and cervical spine was performed following the standard protocol without intravenous contrast. Multiplanar CT image reconstructions of the  cervical spine were also generated.  COMPARISON:  CT scan of cervical spine of January 24, 2015; CT scan of head of December 20, 2012.  FINDINGS: CT HEAD FINDINGS  Bony calvarium appears intact. Mild diffuse cortical atrophy is noted. Mild chronic ischemic white matter disease is noted. No mass effect or midline shift is noted. Ventricular size is within normal limits. There is no evidence of mass lesion, hemorrhage or acute infarction.  CT CERVICAL SPINE FINDINGS  No fracture is noted. Moderate degenerative disc disease is noted at C3-4 and C4-5, with severe degenerative disc disease seen at C5-6, C6-7 C7-T1. Grade 1 anterolisthesis is noted at C3-4, C4-5 and C5-6 secondary to posterior facet joint hypertrophy. Visualized lung apices appear normal.  IMPRESSION: Mild diffuse cortical atrophy. Mild chronic ischemic white matter disease. No acute intracranial abnormality seen.  Moderate to severe multilevel degenerative disc disease is noted in the cervical spine. No acute abnormality is noted.   Electronically Signed   By: Marijo Conception, M.D.   On: 02/07/2015 17:48     Ct Cervical Spine Wo Contrast  01/24/2015   CLINICAL DATA:  Patient fell approximate 1 month ago walking home. Complaining of left lateral neck and shoulder pain.  EXAM: CT CERVICAL SPINE WITHOUT CONTRAST  CT THORACIC SPINE WITHOUT CONTRAST  TECHNIQUE: Multidetector CT imaging of the cervical and thoracic spine was performed without contrast. Multiplanar CT image reconstructions were also generated.  COMPARISON:  Current cervical and thoracic radiographs  FINDINGS: CT CERVICAL SPINE FINDINGS  No fracture. No significant spondylolisthesis. There is a reversal of the normal cervical lordosis centered at C6. Marked loss of disc height at C5-C6 and C7-T1. Moderate loss disc height at C4-C5 and C6-C7. Mild loss disc height at C3-C4. There are facet degenerative changes evident greater on the left from C2-C3 through C5-C6. There are varying degrees of neural foraminal narrowing from uncovertebral and facet spurring. This is greatest on the left at C3-C4, moderate. There is mild central stenosis at C6-C7.  Bones are diffusely demineralized.  There are carotid vascular calcifications bilaterally. Soft tissues otherwise unremarkable.  CT THORACIC SPINE FINDINGS  There is normal vertebral body stature and alignment. No fractures. There are mild disc degenerative changes along the mid and upper thoracic spine reflected by minor loss disc height and small endplate osteophytes. No significant disc bulging. No evidence of a disc herniation. Neural foramina and central spinal canal well preserved.  Surrounding soft tissues show aortic and branch vessel vascular calcifications. Possible partly imaged right renal cyst. No masses or adenopathy. No acute findings in the visualize lungs.  IMPRESSION: CERVICAL CT: No acute fracture or acute finding. There are significant degenerative changes throughout the cervical spine.  THORACIC CT:  No fracture or acute finding.   Electronically Signed   By: Lajean Manes M.D.   On: 01/24/2015  17:27   Ct Thoracic Spine Wo Contrast  01/24/2015   CLINICAL DATA:  Patient fell approximate 1 month ago walking home. Complaining of left lateral neck and shoulder pain.  EXAM: CT CERVICAL SPINE WITHOUT CONTRAST  CT THORACIC SPINE WITHOUT CONTRAST  TECHNIQUE: Multidetector CT imaging of the cervical and thoracic spine was performed without contrast. Multiplanar CT image reconstructions were also generated.  COMPARISON:  Current cervical and thoracic radiographs  FINDINGS: CT CERVICAL SPINE FINDINGS  No fracture. No significant spondylolisthesis. There is a reversal of the normal cervical lordosis centered at C6. Marked loss of disc height at C5-C6 and C7-T1. Moderate loss disc height at  C4-C5 and C6-C7. Mild loss disc height at C3-C4. There are facet degenerative changes evident greater on the left from C2-C3 through C5-C6. There are varying degrees of neural foraminal narrowing from uncovertebral and facet spurring. This is greatest on the left at C3-C4, moderate. There is mild central stenosis at C6-C7.  Bones are diffusely demineralized.  There are carotid vascular calcifications bilaterally. Soft tissues otherwise unremarkable.  CT THORACIC SPINE FINDINGS  There is normal vertebral body stature and alignment. No fractures. There are mild disc degenerative changes along the mid and upper thoracic spine reflected by minor loss disc height and small endplate osteophytes. No significant disc bulging. No evidence of a disc herniation. Neural foramina and central spinal canal well preserved.  Surrounding soft tissues show aortic and branch vessel vascular calcifications. Possible partly imaged right renal cyst. No masses or adenopathy. No acute findings in the visualize lungs.  IMPRESSION: CERVICAL CT: No acute fracture or acute finding. There are significant degenerative changes throughout the cervical spine.  THORACIC CT:  No fracture or acute finding.   Electronically Signed   By: Lajean Manes M.D.   On:  01/24/2015 17:27   US Scrotum  02/08/2015   CLINICAL DATA:  Testicular pain.  EXAM: SCROTAL ULTRASOUND  DOPPLER ULTRASOUND OF THE TESTICLES  TECHNIQUE: Complete ultrasound examination of the testicles, epididymis, and other scrotal structures was performed. Color and spectral Doppler ultrasound were also utilized to evaluate blood flow to the testicles.  COMPARISON:  None.  FINDINGS: Right testicle  Measurements: 2.4 x 2.4 x 1.9 cm. No mass or microlithiasis visualized.  Left testicle  Measurements: 1.9 x 1.6 x 1.1 cm. No mass or microlithiasis visualized.  Right epididymis:  4 mm cyst is noted.  Left epididymis:  7 mm cyst is noted.  Hydrocele:  Small right hydrocele is noted.  Varicocele:  Small left varicocele is noted.  Pulsed Doppler interrogation of both testes demonstrates normal low resistance arterial and venous waveforms bilaterally.  IMPRESSION: No evidence of testicular torsion or mass. Small bilateral epididymal cyst. Small right hydrocele. Small left varicocele.   Electronically Signed   By: Marijo Conception, M.D.   On: 02/08/2015 17:54   [place] Art/ven Flow Abd Pelv Doppler  02/08/2015   CLINICAL DATA:  Testicular pain.  EXAM: SCROTAL ULTRASOUND  DOPPLER ULTRASOUND OF THE TESTICLES  TECHNIQUE: Complete ultrasound examination of the testicles, epididymis, and other scrotal structures was performed. Color and spectral Doppler ultrasound were also utilized to evaluate blood flow to the testicles.  COMPARISON:  None.  FINDINGS: Right testicle  Measurements: 2.4 x 2.4 x 1.9 cm. No mass or microlithiasis visualized.  Left testicle  Measurements: 1.9 x 1.6 x 1.1 cm. No mass or microlithiasis visualized.  Right epididymis:  4 mm cyst is noted.  Left epididymis:  7 mm cyst is noted.  Hydrocele:  Small right hydrocele is noted.  Varicocele:  Small left varicocele is noted.  Pulsed Doppler interrogation of both testes demonstrates normal low resistance arterial and venous waveforms bilaterally.  IMPRESSION: No  evidence of testicular torsion or mass. Small bilateral epididymal cyst. Small right hydrocele. Small left varicocele.   Electronically Signed   By: Marijo Conception, M.D.   On: 02/08/2015 17:54   Dg Abd Portable 1v  02/08/2015   CLINICAL DATA:  Acute abdominal pain  EXAM: PORTABLE ABDOMEN - 1 VIEW  COMPARISON:  01/29/2008  FINDINGS: No evidence of bowel obstruction or perforation. No concerning calcification or mass effect. Atherosclerotic calcification.  IMPRESSION: Normal bowel gas pattern.   Electronically Signed   By: Monte Fantasia M.D.   On: 02/08/2015 16:24     PERTINENT LAB RESULTS: CBC: No results for input(s): WBC, HGB, HCT, PLT in the last 72 hours. CMET CMP     Component Value Date/Time   NA 144 02/09/2015 0700   K 3.9 02/09/2015 0700   CL 109 02/09/2015 0700   CO2 26 02/09/2015 0700   GLUCOSE 90 02/09/2015 0700   BUN 19 02/09/2015 0700   CREATININE 1.13 02/09/2015 0700   CREATININE 1.33 01/18/2013 1204   CALCIUM 9.0 02/09/2015 0700   PROT <3.0* 02/09/2015 0700   ALBUMIN 3.0* 02/09/2015 0700   AST 21 02/09/2015 0700   ALT 18 02/09/2015 0700   ALKPHOS 58 02/09/2015 0700   BILITOT 0.8 02/09/2015 0700   GFRNONAA 53* 02/09/2015 0700   GFRAA >60 02/09/2015 0700    GFR CrCl cannot be calculated (Unknown ideal weight.). No results for input(s): LIPASE, AMYLASE in the last 72 hours. No results for input(s): CKTOTAL, CKMB, CKMBINDEX, TROPONINI in the last 72 hours. Invalid input(s): POCBNP No results for input(s): DDIMER in the last 72 hours. No results for input(s): HGBA1C in the last 72 hours. No results for input(s): CHOL, HDL, LDLCALC, TRIG, CHOLHDL, LDLDIRECT in the last 72 hours. No results for input(s): TSH, T4TOTAL, T3FREE, THYROIDAB in the last 72 hours.  Invalid input(s): FREET3 No results for input(s): VITAMINB12, FOLATE, FERRITIN, TIBC, IRON, RETICCTPCT in the last 72 hours. Coags: No results for input(s): INR in the last 72 hours.  Invalid input(s):  PT Microbiology: No results found for this or any previous visit (from the past 240 hour(s)).   BRIEF HOSPITAL COURSE:  Summary: 81 year old male with history of seizure disorder, CVA, aortic stenosis, recently diagnosed with dermatitis and started on triamcinolone cream and Benadryl, brought to the hospital for evaluation of frequent falls and altered mental status.   Hospital course by problem list: Acute encephalopathy: Suspect from Benadryl. Mental status improved and back to baseline.Neurology recommends no further work up-hence both MRI Brain and EEG discontinued.   Diffuse abdominal pain: much improved, CT Abd neg for acute abnormalities. Suspect this was from a distended bladder.Tolerating advancement in diet, Foley catheter placed. No further abdominal pain or discharge.   Enlarged scrotum: likely chronic, Ultrasound with doppler neg for torsion.   Elevated d-dimer: Not hypoxic-in fact O2 saturation in the high 90s on room air. Lungs are completely clear. Lower extremity Dopplers are negative. Low suspicion for pulmonary embolism. Patient/family refused CT angio chest/VQ scan lon admission. 2-D echocardiogram showed mild pulmonary hypertension that I suspect is chronic (reviewed prior echo).Suspect we could hold off on further investigations at this point.  Atrial tachycardia: Cards consulted, started on low-dose Lopressor.  Acute Urinary Retention:continue Flomax/Finasteride-Urology consulted-difficult foley insertion-Urology inserted Foley on 9/12-complicated by foley trauma and hematuria, Improved with irrigation-now with very minimal and insignificant hematuria. Stable for d/c with close outpatient follow up with Urology  Dermatitis/skin rash: Per family this has been ongoing for the past 3 weeks-rash is generalized-patient has had follow-up with dermatologist (Dr. Ronnald Ramp) and was told that this is dermatitis him and was given triamcinolone ointment and Benadryl. Will continue  triamcinolone, hold Benadryl-please make sure patient follows up with dermatology as outpatient  History of chronic kidney disease stage III: Creatinine close to usual baseline, follow.  History of CAD: No chest pain or shortness of breath currently. Continue aspirin  History of CVA: Nonfocal exam. Continue aspirin.  History of seizure disorder: Continue with Keppra.  History of Moderate aortic stenosis: Outpatient follow-up with primary cardiologist.  4.6 cm infrarenal abdominal aortic aneurysm, enlarged since 2009 per FI:EPPIRJ for outpatient follow up and monitoring  TODAY-DAY OF DISCHARGE:  Subjective:   Celedonio Sortino today has no headache,no chest abdominal pain,no new weakness tingling or numbness. Patient is awake and alert.  Objective:   Blood pressure 103/58, pulse 64, temperature 97.6 F (36.4 C), temperature source Oral, resp. rate 16, SpO2 94 %.  Intake/Output Summary (Last 24 hours) at 02/12/15 1043 Last data filed at 02/12/15 1884  Gross per 24 hour  Intake    980 ml  Output    950 ml  Net     30 ml   There were no vitals filed for this visit.  Exam Awake Alert, Oriented *3, No new F.N deficits, Normal affect [place].AT,PERRAL Supple Neck,No JVD, No cervical lymphadenopathy appriciated.  Symmetrical Chest wall movement, Good air movement bilaterally, CTAB RRR,No Gallops,Rubs or new Murmurs, No Parasternal Heave +ve B.Sounds, Abd Soft, Non tender, No organomegaly appriciated, No rebound -guarding or rigidity. No Cyanosis, Clubbing or edema, No new Rash or bruise  DISCHARGE CONDITION: Stable  DISPOSITION: SNF  DISCHARGE INSTRUCTIONS:    Activity:  As tolerated with Full fall precautions use walker/cane & assistance as needed  Get Medicines reviewed and adjusted: Please take all your medications with you for your next visit with your Primary MD  Please request your Primary MD to go over all hospital tests and procedure/radiological results at the follow  up, please ask your Primary MD to get all Hospital records sent to his/her office.  If you experience worsening of your admission symptoms, develop shortness of breath, life threatening emergency, suicidal or homicidal thoughts you must seek medical attention immediately by calling 911 or calling your MD immediately  if symptoms less severe.  You must read complete instructions/literature along with all the possible adverse reactions/side effects for all the Medicines you take and that have been prescribed to you. Take any new Medicines after you have completely understood and accpet all the possible adverse reactions/side effects.   Do not drive when taking Pain medications.   Do not take more than prescribed Pain, Sleep and Anxiety Medications  Special Instructions: If you have smoked or chewed Tobacco  in the last 2 yrs please stop smoking, stop any regular Alcohol  and or any Recreational drug use.  Wear Seat belts while driving.  Please note  You were cared for by a hospitalist during your hospital stay. Once you are discharged, your primary care physician will handle any further medical issues. Please note that NO REFILLS for any discharge medications will be authorized once you are discharged, as it is imperative that you return to your primary care physician (or establish a relationship with a primary care physician if you do not have one) for your aftercare needs so that they can reassess your need for medications and monitor your lab values.   Diet recommendation: Diabetic Diet Heart Healthy diet  Discharge Instructions    Call MD for:  hives    Complete by:  As directed      Call MD for:  persistant dizziness or light-headedness    Complete by:  As directed      Diet - low sodium heart healthy    Complete by:  As directed      Increase activity slowly    Complete by:  As directed  Follow-up Information    Follow up with [facility], MD.   Specialty:   Internal Medicine   Contact information:   [address] [place] [address] [phone]       Follow up with Bonnita Levan, MD. Schedule an appointment as soon as possible for a visit in 1 week.   Specialty:  Dermatology   Contact information:   [place] [address] [phone]       Follow up with Malka So, MD. Schedule an appointment as soon as possible for a visit in 2 weeks.   Specialty:  Urology   Why:  for outpatient voiding trial   Contact information:   [address] [place] [address] [phone]       Total Time spent on discharge equals  45 minutes.  SignedOren Binet 02/12/2015 10:43 AM

## 2015-02-10 NOTE — Clinical Social Work Note (Signed)
Clinical Social Work Assessment  Patient Details  Name: Larry Bradford MRN: [id number] Date of Birth: 01/28/1917  Date of referral:  02/10/15               Reason for consult:  Facility Placement, Discharge Planning                Permission sought to share information with:  Case Manager, Family Supports Permission granted to share information::  Yes, Verbal Permission Granted  Name::      Larry Bradford and Larry Bradford )  Agency::   (None )  Relationship::   (Significant Other and Step Daughter )  Contact Information:   [phone])  Housing/Transportation Living arrangements for the past 2 months:  [place] of Information:  Patient, Adult Children Patient Interpreter Needed:  None Criminal Activity/Legal Involvement Pertinent to Current Situation/Hospitalization:  No - Comment as needed Significant Relationships:  Other Family Members, Adult Children, Significant Other Lives with:  Significant Other Do you feel safe going back to the place where you live?  Yes Need for family participation in patient care:  Yes (Comment)  Care giving concerns: Requiring 24 hour assistance/supervision at discharge.    Social Worker assessment / plan:  Holiday representative met with patient at bedside in reference to post-acute placement for SNF. CSW introduced CSW role and SNF process. Patient reported he plans to return home with significant other, Larry Bradford. Pt acknowledged that he is very weak and feeling even more weak after foley catheter was inserted. Pt insisted on returning home after CSW explained risks of returning home with limited 24 hour support. Pt gave CSW permission to contact family. CSW contacted family and explained pt's wishes at discharge. Pt's family arrived at hospital and had 45 minute conversation in regards to pt discharging to SNF. Pt's currently still refusing placement and stated he is going home today! CSW presented discharge options and explained home  health services. Pt's family NOT happy about pt returning home however realizes decision is ultimately left to patient. CSW has notified RNCM. RNCM has met with family at bedside. No further concerns reported by family at this time. CSW will sign off for now as social work intervention is no longer needed. Please consult [place] again if new need arises.  Employment status:  Retired Nurse, adult PT Recommendations:  [place] / Referral to community resources:  [place]  Patient/Family's Response to care:  Pt alert and oriented X4. Pt currently refusing SNF placement and wishes to return home with home health services. Pt's family unhappy with pt's decision however respects pt's wishes to return home with significant other. Pt and family pleasant and appreciated social work intervention.   Patient/Family's Understanding of and Emotional Response to Diagnosis, Current Treatment, and Prognosis:  Pt and family understands pt is high fall risk and would benefit from continued therapy however pt is refusing SNF placement. CSW signing off.   Emotional Assessment Appearance:  Appears stated age Attitude/Demeanor/Rapport:   (Pleasant ) Affect (typically observed):  Apprehensive, Appropriate, Pleasant, Hopeful Orientation:  Oriented to Self, Oriented to Place, Oriented to  Time, Oriented to Situation Alcohol / Substance use:  Not Applicable Psych involvement (Current and /or in the community):  No (Comment)  Discharge Needs  Concerns to be addressed:  Care Coordination, Discharge Planning Concerns, Patient refuses services Readmission within the last 30 days:  No Current discharge risk:  Dependent with Mobility, Physical Impairment Barriers to Discharge:  Unsafe home situation, Barriers Resolved   Larry Bradford, MSW, LCSWA [phone] 02/10/2015 12:11 PM

## 2015-02-10 NOTE — Care Management Note (Signed)
Case Management Note  Patient Details  Name: Larry Bradford MRN: [id number] Date of Birth: [birth year]  Subjective/Objective:                    Action/Plan: Pt admitted with falls. Pt lives home with his spouse. Pt being discharged home with home health services and DME. Katie with [facility] notified of hospital bed, 3 in 1, and rolling walker. Address verified with family and contact number Jenny Reichmann [phone]) given to [facility] ([facility]). Miranda with [facility] notified of [place] order and she accepted the referral. Patient's wife also given list of Private Duty agencies to help with care at home. She was made aware that these are not covered by insurance.   Expected Discharge Date:  02/10/15               Expected Discharge Plan:  [place]  In-House Referral:  Clinical Social Work  Discharge planning Services  CM Consult  Post Acute Care Choice:    Choice offered to:  Spouse, Adult Children  DME Arranged:  3-N-1, Hospital bed, Walker rolling DME Agency:  [place]:  RN, PT, OT, Nurse's Aide (CSW) [place]:  [place]  Status of Service:  In process, will continue to follow  Medicare Important Message Given:  Yes-second notification given Date Medicare IM Given:    Medicare IM give by:    Date Additional Medicare IM Given:    Additional Medicare Important Message give by:     If discussed at [place] of Stay Meetings, dates discussed:    Additional Comments:  Ollen Gross, RN 02/10/2015, 1:22 PM

## 2015-02-10 NOTE — Consult Note (Signed)
Subjective: I was asked to see Larry Bradford in consultation by Dr. Sloan Leiter for urinary retention.  Larry Bradford is a 81 yo male with a history of BPH with BOO who is a former patient of Dr. Terance Hart but he has not been seen since 2006.  He was previously on avodart and flomax.  He was admitted for altered mental status and a fall.   His admission UA was unremarkable but he has had progressive difficulty voiding and was noted on CT on 9/10 to have a markedly distended bladder with an enlarge prostate and small bladder stone.   A 16 foley was placed earlier today and drained initially but he had hematuria and developed clot retention.  An attempt to place a 45fr foley was not successful because of meatal stenosis.   He currently has lower abdominal pain.  ROS:  Review of Systems  Unable to perform ROS: mental acuity    No Known Allergies  Past Medical History  Diagnosis Date  . Seizures     Onset in Sept 2006  . Cataract   . Hard of hearing   . Renal disorder   . Depression   . HTN (hypertension)   . BPH (benign prostatic hyperplasia)   . CAD (coronary artery disease)     s/p CABG in 2002  . Hyperlipidemia   . OSA (obstructive sleep apnea)   . Aortic aneurysm     3 CM in 2007  . Carotid stenosis   . DDD (degenerative disc disease)   . Trigeminal neuralgia   . Lower GI bleed   . Stroke   . Allergy   . Aortic stenosis     Past Surgical History  Procedure Laterality Date  . Coronary artery bypass graft  2002  . Temporal artery biopsy / ligation  2004    Social History   Social History  . Marital Status: Married    Spouse Name: N/A  . Number of Children: N/A  . Years of Education: N/A   Occupational History  . Not on file.   Social History Main Topics  . Smoking status: Never Smoker   . Smokeless tobacco: Never Used  . Alcohol Use: No  . Drug Use: No  . Sexual Activity: No   Other Topics Concern  . Not on file   Social History Narrative   reports that he  has never smoked. He does not have any smokeless tobacco history on file. He reports that he does not drink alcohol. His drug history not on file.          Family History  Problem Relation Age of Onset  . CAD Brother   . Heart disease Brother   . Cancer Mother     Anti-infectives: Anti-infectives    Start     Dose/Rate Route Frequency Ordered Stop   02/08/15 1515  ceFAZolin (ANCEF) IVPB 1 g/50 mL premix  Status:  Discontinued     1 g 100 mL/hr over 30 Minutes Intravenous 3 times per day 02/08/15 1513 02/09/15 1122   02/07/15 2200  cephALEXin (KEFLEX) capsule 500 mg  Status:  Discontinued     500 mg Oral 3 times daily 02/07/15 2047 02/08/15 1512      Current Facility-Administered Medications  Medication Dose Route Frequency Provider Last Rate Last Dose  . acetaminophen (TYLENOL) tablet 500 mg  500 mg Oral Q6H PRN Ivor Costa, MD   500 mg at 02/08/15 1455  . finasteride (PROSCAR) tablet 5 mg  5 mg Oral Daily Irine Seal, MD      . levETIRAcetam (KEPPRA) tablet 500 mg  500 mg Oral BID Jonetta Osgood, MD   500 mg at 02/10/15 0932  . lidocaine (XYLOCAINE) 2 % jelly 1 application  1 application Urethral Once Jonetta Osgood, MD      . metoprolol succinate (TOPROL-XL) 24 hr tablet 25 mg  25 mg Oral Daily Thompson Grayer, MD   25 mg at 02/10/15 0932  . ondansetron (ZOFRAN) injection 4 mg  4 mg Intravenous Q6H PRN Samella Parr, NP      . pantoprazole (PROTONIX) EC tablet 40 mg  40 mg Oral QHS Kris Mouton, RPH      . sodium chloride 0.9 % injection 3 mL  3 mL Intravenous Q12H Ivor Costa, MD   3 mL at 02/10/15 0932  . tamsulosin (FLOMAX) capsule 0.4 mg  0.4 mg Oral QPC supper Jonetta Osgood, MD   0.4 mg at 02/09/15 1508  . triamcinolone cream (KENALOG) 0.1 % 1 application  1 application Topical BID Ivor Costa, MD   1 application at 94/76/54 0932     Objective: Vital signs in last 24 hours: Temp:  [97.5 F (36.4 C)-98 F (36.7 C)] 97.5 F (36.4 C) (09/12 1409) Pulse Rate:   [58-106] 58 (09/12 1409) Resp:  [16-20] 18 (09/12 1409) BP: (109-128)/(47-76) 109/56 mmHg (09/12 1409) SpO2:  [95 %-99 %] 95 % (09/12 1409)  Intake/Output from previous day: 09/11 0701 - 09/12 0700 In: -  Out: 400 [Urine:400] Intake/Output this shift: Total I/O In: 720 [P.O.:720] Out: 150 [Urine:150]   Physical Exam  Constitutional: He is well-developed, well-nourished, and in no distress.  HENT:  Right Ear: Decreased hearing is noted.  Left Ear: Decreased hearing is noted.  Neck: Normal range of motion. Neck supple.  Cardiovascular: Normal rate, regular rhythm and normal heart sounds.   Pulmonary/Chest: Effort normal and breath sounds normal. No respiratory distress.  Abdominal: Soft. He exhibits mass (suprapubic). There is tenderness.  Genitourinary:  Uncirc phallus with a narrow meatus.  Scrotum and testes unremarkable.   Rectal deferred because of patient's condition.   Musculoskeletal: Normal range of motion. He exhibits no tenderness.  Skin: Skin is warm and dry.    Lab Results:   Recent Labs  02/08/15 1640 02/09/15 0700  WBC 7.3 7.5  HGB 15.2 13.1  HCT 45.9 40.6  PLT 212 200   BMET  Recent Labs  02/08/15 1640 02/09/15 0700  NA 140 144  K 4.1 3.9  CL 104 109  CO2 27 26  GLUCOSE 109* 90  BUN 18 19  CREATININE 1.23 1.13  CALCIUM 9.3 9.0   PT/INR No results for input(s): LABPROT, INR in the last 72 hours. ABG  Recent Labs  02/08/15 0653  PHART 7.415  HCO3 23.1    Studies/Results: Ct Abdomen Pelvis Wo Contrast  02/08/2015   CLINICAL DATA:  Generalized abdominal pain with nausea and vomiting.  EXAM: CT ABDOMEN AND PELVIS WITHOUT CONTRAST  TECHNIQUE: Multidetector CT imaging of the abdomen and pelvis was performed following the standard protocol without IV contrast.  COMPARISON:  01/29/2008  FINDINGS: Motion artifact with mild atelectasis in the lung bases. No pleural effusion. Three-vessel coronary artery calcification.  Multiple small gallstones  are again seen. There is no biliary dilatation. The liver, spleen, adrenal glands, and pancreas are unremarkable. Multiple low-density renal lesions are again seen bilaterally compatible with cysts. The largest measures 9.4 cm in the right  kidney and has mildly increased in size from the prior study. There are punctate, nonobstructing lower pole renal calculi bilaterally.  Diffuse aortoiliac atherosclerotic calcification is again seen. Displacement of minimal calcification centrally within the proximal abdominal aorta is unchanged and may reflect a short segment dissection. Infrarenal abdominal aortic aneurysm has mildly enlarged and now measures 4.6 x 4.1 cm (previously 3.5 cm). The common iliac arteries measure up to 1.7 cm on the right and 1.8 cm on the left, stable to minimally increased from prior.  Predominantly left-sided colonic diverticulosis is present without evidence of diverticulitis. Appendix is unremarkable. There is no evidence of bowel obstruction.  There are bilateral fat containing inguinal hernias. The prostate remains prominently enlarged, measuring 7.2 x 7.0 cm. There is moderate distention of the bladder. A 6 mm stone is present posteriorly in the bladder lumen, new from the prior study. No free fluid or enlarged lymph nodes are identified. Grade 1 anterolisthesis is again seen of L4 on L5 and appears facet mediated. Mild, chronic L2 compression fracture demonstrates minimally progressive height loss.  IMPRESSION: 1. Cholelithiasis. 2. Punctate nonobstructing bilateral renal calculi. 3. New 6 mm stone in the bladder. 4. 4.6 cm infrarenal abdominal aortic aneurysm, enlarged since 2009.   Electronically Signed   By: Logan Bores M.D.   On: 02/08/2015 18:32   US Scrotum  02/08/2015   CLINICAL DATA:  Testicular pain.  EXAM: SCROTAL ULTRASOUND  DOPPLER ULTRASOUND OF THE TESTICLES  TECHNIQUE: Complete ultrasound examination of the testicles, epididymis, and other scrotal structures was performed.  Color and spectral Doppler ultrasound were also utilized to evaluate blood flow to the testicles.  COMPARISON:  None.  FINDINGS: Right testicle  Measurements: 2.4 x 2.4 x 1.9 cm. No mass or microlithiasis visualized.  Left testicle  Measurements: 1.9 x 1.6 x 1.1 cm. No mass or microlithiasis visualized.  Right epididymis:  4 mm cyst is noted.  Left epididymis:  7 mm cyst is noted.  Hydrocele:  Small right hydrocele is noted.  Varicocele:  Small left varicocele is noted.  Pulsed Doppler interrogation of both testes demonstrates normal low resistance arterial and venous waveforms bilaterally.  IMPRESSION: No evidence of testicular torsion or mass. Small bilateral epididymal cyst. Small right hydrocele. Small left varicocele.   Electronically Signed   By: Marijo Conception, M.D.   On: 02/08/2015 17:54   [place] Art/ven Flow Abd Pelv Doppler  02/08/2015   CLINICAL DATA:  Testicular pain.  EXAM: SCROTAL ULTRASOUND  DOPPLER ULTRASOUND OF THE TESTICLES  TECHNIQUE: Complete ultrasound examination of the testicles, epididymis, and other scrotal structures was performed. Color and spectral Doppler ultrasound were also utilized to evaluate blood flow to the testicles.  COMPARISON:  None.  FINDINGS: Right testicle  Measurements: 2.4 x 2.4 x 1.9 cm. No mass or microlithiasis visualized.  Left testicle  Measurements: 1.9 x 1.6 x 1.1 cm. No mass or microlithiasis visualized.  Right epididymis:  4 mm cyst is noted.  Left epididymis:  7 mm cyst is noted.  Hydrocele:  Small right hydrocele is noted.  Varicocele:  Small left varicocele is noted.  Pulsed Doppler interrogation of both testes demonstrates normal low resistance arterial and venous waveforms bilaterally.  IMPRESSION: No evidence of testicular torsion or mass. Small bilateral epididymal cyst. Small right hydrocele. Small left varicocele.   Electronically Signed   By: Marijo Conception, M.D.   On: 02/08/2015 17:54   I have reviewed his history, labs, CT films and prior office  notes and  have discussed the case with Dr. Sloan Leiter.  Procedure:  The genitalia was prepped with betadine and draped with sterile towels.   The urethra was instilled with 80ml of 2% lidocaine jelly.  The meatus was dilated to 26 fr with van buren sounds and a 19ft coude foley was placed.  The catheter was hubbed at the meatus to reach the bladder but passed without difficulty.   The balloon was filled with 57ml and the catheter was then irrigated with several hundred ml of sterile fluid with return of several clots.  The urine remained pink but no more significant clots returned.   Assessment: Urinary retention with gross hematuria following initial foley placement that was probably from the balloon being inflated in the prostate with the patient's long prostatic urethra.    I have added finasteride and have ordered bladder irrigations.  I will continue to follow.  He will need office f/u with [place] in about 2 weeks for a voiding trial.     CC: Dr. Royanne Foots    Irine Seal J 02/10/2015 [phone]

## 2015-02-10 NOTE — Progress Notes (Signed)
Physical Therapy Treatment Patient Details Name: Larry Bradford MRN: [id number] DOB: 1916-10-30 Today's Date: 02/10/2015    History of Present Illness 81 y.o. male admitted for acute encephalopathy, found to have elevated D-dimer.    PT Comments    Pt making slow progress towards goals and was limited greatly by pain today.  Note that RN had placed catheter prior to session and pt with increased bleeding from area as well as pain.  RN made aware of bleeding.  PT able to re-position catheter anchor for improved comfort and pt able to ambulate in hallway.  Daughter in law called while PT in room and PT spoke with her regarding D/C plans and concerns for increased falling at home.  Daughter in law verbalized understanding and ask that this PT speak with pt regarding those plans.  PT discussed D/C plans with pt and reasoning behind decision.  Pt verbalized understanding, however feel he will need continued education as he felt that "staying in bed longer" would make him "stronger."  Provided education on increased mobility to improve function and balance.   Follow Up Recommendations  SNF;Supervision/Assistance - 24 hour     Equipment Recommendations  Other (comment)    Recommendations for Other Services       Precautions / Restrictions Precautions Precautions: Fall Restrictions Weight Bearing Restrictions: No    Mobility  Bed Mobility Overal bed mobility: Needs Assistance Bed Mobility: Supine to Sit     Supine to sit: Min assist     General bed mobility comments: Max verbal cues and min A to elevate trunk into sitting due to increased penile pain from catheter placement.  Provided replacement of anchor for comfort, however pt still requires min A due to pain.   Transfers Overall transfer level: Needs assistance Equipment used: Rolling walker (2 wheeled) Transfers: Sit to/from Stand Sit to Stand: Min assist         General transfer comment: Heavy cues for hand placement  and safety.  Again, requires min A for forward weight shift and to elevate into standing due to pain.  Provided cues for slow deep breathing during transitional movements as well.   Ambulation/Gait Ambulation/Gait assistance: Min assist Ambulation Distance (Feet): 85 Feet Assistive device: Rolling walker (2 wheeled) Gait Pattern/deviations: Step-through pattern;Decreased stride length;Trunk flexed;Drifts right/left Gait velocity: slow   General Gait Details: Continue to educate on safe use of DME with use of RW, esp when negotiating through tight spaces.  Cues for upright posture.  Unable to get good HR reading due to hands being cold, however pt asymptomatic during gait.    Stairs            Wheelchair Mobility    Modified Rankin (Stroke Patients Only)       Balance Overall balance assessment: Needs assistance Sitting-balance support: Feet supported;Single extremity supported Sitting balance-Leahy Scale: Fair     Standing balance support: During functional activity;Bilateral upper extremity supported Standing balance-Leahy Scale: Poor Standing balance comment: Pt continues to rely heavily on use of RW for UE support during all upright postures.                     Cognition Arousal/Alertness: Awake/alert Behavior During Therapy: WFL for tasks assessed/performed Overall Cognitive Status: Difficult to assess (difficult to assess due to [facility]; also no family present )                      Exercises  General Comments        Pertinent Vitals/Pain Pain Assessment: 0-10 Pain Score: 7  Pain Location: penile pain from catheter (improved with replacement of anchor) Pain Descriptors / Indicators: Sharp;Shooting Pain Intervention(s): Limited activity within patient's tolerance;Monitored during session;Repositioned;Other (comment) (repositioned catheter anchor for comfort)    Home Living                      Prior Function            PT  Goals (current goals can now be found in the care plan section) Acute Rehab PT Goals Patient Stated Goal: to return home PT Goal Formulation: With family Time For Goal Achievement: 02/23/15 Potential to Achieve Goals: Good Progress towards PT goals: Progressing toward goals    Frequency  Min 3X/week    PT Plan Current plan remains appropriate    Co-evaluation             End of Session Equipment Utilized During Treatment: Gait belt Activity Tolerance: Patient limited by pain Patient left: in chair;with call bell/phone within reach;with chair alarm set     Time: 7829-5621 PT Time Calculation (min) (ACUTE ONLY): 35 min  Charges:  $Gait Training: 8-22 mins $Therapeutic Activity: 8-22 mins                    G Codes:      Denice Bors 02/10/2015, 10:32 AM

## 2015-02-11 DIAGNOSIS — N400 Enlarged prostate without lower urinary tract symptoms: Secondary | ICD-10-CM

## 2015-02-11 LAB — GLUCOSE, CAPILLARY: Glucose-Capillary: 119 mg/dL — ABNORMAL HIGH (ref 65–99)

## 2015-02-11 NOTE — Progress Notes (Signed)
Patient ID: Larry Bradford, male   DOB: 31-Dec-1916, 81 y.o.   MRN: [id number]    Subjective: Pt resting comfortably.   Nurse reports much easier irrigation through night.  Urine is slightly bloody in tube.   ROS:  Review of Systems  Unable to perform ROS: other    Anti-infectives: Anti-infectives    Start     Dose/Rate Route Frequency Ordered Stop   02/08/15 1515  ceFAZolin (ANCEF) IVPB 1 g/50 mL premix  Status:  Discontinued     1 g 100 mL/hr over 30 Minutes Intravenous 3 times per day 02/08/15 1513 02/09/15 1122   02/07/15 2200  cephALEXin (KEFLEX) capsule 500 mg  Status:  Discontinued     500 mg Oral 3 times daily 02/07/15 2047 02/08/15 1512      Current Facility-Administered Medications  Medication Dose Route Frequency Provider Last Rate Last Dose  . acetaminophen (TYLENOL) tablet 500 mg  500 mg Oral Q6H PRN Ivor Costa, MD   500 mg at 02/08/15 1455  . finasteride (PROSCAR) tablet 5 mg  5 mg Oral Daily Irine Seal, MD   5 mg at 02/10/15 1722  . levETIRAcetam (KEPPRA) tablet 500 mg  500 mg Oral BID Jonetta Osgood, MD   500 mg at 02/10/15 2241  . lidocaine (XYLOCAINE) 2 % jelly 1 application  1 application Urethral Once Jonetta Osgood, MD      . metoprolol succinate (TOPROL-XL) 24 hr tablet 25 mg  25 mg Oral Daily Thompson Grayer, MD   25 mg at 02/10/15 0932  . ondansetron (ZOFRAN) injection 4 mg  4 mg Intravenous Q6H PRN Samella Parr, NP      . pantoprazole (PROTONIX) EC tablet 40 mg  40 mg Oral QHS Kris Mouton, RPH   40 mg at 02/10/15 2242  . sodium chloride 0.9 % injection 3 mL  3 mL Intravenous Q12H Ivor Costa, MD   3 mL at 02/10/15 2243  . tamsulosin (FLOMAX) capsule 0.4 mg  0.4 mg Oral QPC supper Jonetta Osgood, MD   0.4 mg at 02/10/15 1722  . triamcinolone cream (KENALOG) 0.1 % 1 application  1 application Topical BID Ivor Costa, MD   1 application at 56/81/27 0932     Objective: Vital signs in last 24 hours: Temp:  [97.5 F (36.4 C)-99 F (37.2 C)] 98.6 F (37  C) (09/13 0525) Pulse Rate:  [58-112] 60 (09/13 0525) Resp:  [18-22] 20 (09/13 0525) BP: (109-142)/(47-86) 142/68 mmHg (09/13 0525) SpO2:  [94 %-99 %] 99 % (09/13 0525)  Intake/Output from previous day: 09/12 0701 - 09/13 0700 In: 960 [P.O.:960] Out: 700 [Urine:700] Intake/Output this shift: Total I/O In: -  Out: 550 [Urine:550]   Physical Exam  Constitutional: He is well-developed, well-nourished, and in no distress.  Genitourinary:  Urine slightly bloody in foley tubing.    Lab Results:   Recent Labs  02/08/15 1640 02/09/15 0700  WBC 7.3 7.5  HGB 15.2 13.1  HCT 45.9 40.6  PLT 212 200   BMET  Recent Labs  02/08/15 1640 02/09/15 0700  NA 140 144  K 4.1 3.9  CL 104 109  CO2 27 26  GLUCOSE 109* 90  BUN 18 19  CREATININE 1.23 1.13  CALCIUM 9.3 9.0   PT/INR No results for input(s): LABPROT, INR in the last 72 hours. ABG No results for input(s): PHART, HCO3 in the last 72 hours.  Invalid input(s): PCO2, PO2  Studies/Results: No results found.  Assessment and Plan: Gross hematuria is resolving.  Continue foley drainage with irrigation prn.  Will need to f/u with [place] in 2-3 weeks.        LOS: 4 days    Larry Bradford 02/11/2015 [phone]

## 2015-02-11 NOTE — Progress Notes (Signed)
PATIENT DETAILS Name: Larry Bradford Age: 81 y.o. Sex: male Date of Birth: 1917-03-14 Admit Date: 02/07/2015 Admitting Physician Ivor Costa, MD ZOX:[id number],BJYNW, MD  Summary: 81 year old male with history of seizure disorder, CVA, aortic stenosis, recently diagnosed with dermatitis and started on triamcinolone cream and Benadryl, brought to the hospital for evaluation of frequent falls and altered mental status.   Subjective: Awake and alert-some hematuria continues-but improved  Assessment/Plan: Principal Problem: Acute encephalopathy: Suspect from Benadryl. Mental status improved.Neurology recommends no further work up-hence both MRI Brain and EEG discontinued.   Active Problems: Diffuse abdominal pain: much improved, CT Abd neg for acute abnormalities. Suspect this was from a distended bladder.Advance diet. Follow  Enlarged scrotum: likely chronic, Ultrasound with doppler neg for torsion.   Elevated d-dimer: Not hypoxic during my exam-in fact O2 saturation in the high 90s. Lungs are completely clear. Lower extremity Dopplers are negative. Low suspicion for pulmonary embolism. Patient/family refused CT angios/VQ scan lon admission. Suspect we could hold off on further investigations at this point.  Atrial Tachycardia: Cards consulted. Started on Metoprolol-stable.  Acute Urinary Retention:continue Flomax/Finasteride-Urology consulted-difficult foley insertion-Urology inserted Foley on 9/12-complicated by foley trauma and hematuria, Improving with irrigation. Monitor for one more day, and will likely discharge tomorrow.  Dermatitis/skin rash: Per family this has been ongoing for the past 3 weeks-rash is generalized-patient has had follow-up with dermatologist (Dr. Ronnald Ramp) and was told that this is dermatitis him and was given triamcinolone ointment and Benadryl. Will continue triamcinolone, hold Benadryl. Appears stable-follow with primary Dermatologist  History  of chronic kidney disease stage III: Creatinine close to usual baseline, follow.  History of CAD: No chest pain or shortness of breath currently. Continue aspirin  History of CVA: Nonfocal exam.ASA discontinued due to Hematuria  History of seizure disorder: Continue with Keppra.  History of Moderate aortic stenosis: Outpatient follow-up with primary cardiologist.  4.6 cm infrarenal abdominal aortic aneurysm, enlarged since 2009 per GN:FAOZHY for outpatient follow up and monitoring  Disposition: Remain inpatient-refused SNF, Home health services on discharge-likley 9/14  Antimicrobial agents  See below  Anti-infectives    Start     Dose/Rate Route Frequency Ordered Stop   02/08/15 1515  ceFAZolin (ANCEF) IVPB 1 g/50 mL premix  Status:  Discontinued     1 g 100 mL/hr over 30 Minutes Intravenous 3 times per day 02/08/15 1513 02/09/15 1122   02/07/15 2200  cephALEXin (KEFLEX) capsule 500 mg  Status:  Discontinued     500 mg Oral 3 times daily 02/07/15 2047 02/08/15 1512      DVT Prophylaxis: SCD's-stopped Heparin due to hematuria  Code Status: Full code   Family Communication None at bedside-but long discussion with family yesterday  Procedures: None  CONSULTS:  neurology   Cards  Time spent 25 minutes-Greater than 50% of this time was spent in counseling, explanation of diagnosis, planning of further management, and coordination of care.  MEDICATIONS: Scheduled Meds: . finasteride  5 mg Oral Daily  . levETIRAcetam  500 mg Oral BID  . lidocaine  1 application Urethral Once  . metoprolol succinate  25 mg Oral Daily  . pantoprazole  40 mg Oral QHS  . sodium chloride  3 mL Intravenous Q12H  . tamsulosin  0.4 mg Oral QPC supper  . triamcinolone cream  1 application Topical BID   Continuous Infusions:   PRN Meds:.acetaminophen, ondansetron (ZOFRAN) IV    PHYSICAL EXAM: Vital  signs in last 24 hours: Filed Vitals:   02/10/15 2115 02/11/15 0119 02/11/15 0525  02/11/15 0812  BP: 140/86 136/81 142/68 115/58  Pulse: 101 112 60 66  Temp: 99 F (37.2 C) 98.4 F (36.9 C) 98.6 F (37 C) 97.6 F (36.4 C)  TempSrc: Oral Oral Oral Oral  Resp: 22 20 20 20   SpO2: 99% 96% 99% 96%    Weight change:  There were no vitals filed for this visit. There is no weight on file to calculate BMI.   Gen Exam: Awake and alert with clear speech.   Neck: Supple, No JVD.  Chest: B/L Clear.   CVS: S1 S2 Regular, +syst murmur Abdomen: soft, BS +, Non tender, non distended.  Extremities: no edema, lower extremities warm to touch. Neurologic: Non Focal.   Wounds: N/A.    Intake/Output from previous day:  Intake/Output Summary (Last 24 hours) at 02/11/15 1128 Last data filed at 02/11/15 0841  Gross per 24 hour  Intake    960 ml  Output    700 ml  Net    260 ml     LAB RESULTS: CBC  Recent Labs Lab 02/07/15 1748 02/08/15 0614 02/08/15 1640 02/09/15 0700  WBC 8.4 7.3 7.3 7.5  HGB 15.0 15.5 15.2 13.1  HCT 45.5 46.2 45.9 40.6  PLT 192 170 212 200  MCV 95.0 96.0 93.9 94.4  MCH 31.3 32.2 31.1 30.5  MCHC 33.0 33.5 33.1 32.3  RDW 13.5 13.5 13.4 13.5  LYMPHSABS 1.2  --   --  1.4  MONOABS 0.5  --   --  0.6  EOSABS 0.3  --   --  0.3  BASOSABS 0.0  --   --  0.0    Chemistries   Recent Labs Lab 02/07/15 1748 02/08/15 0614 02/08/15 1640 02/09/15 0700  NA 145 141 140 144  K 4.4 4.6 4.1 3.9  CL 109 110 104 109  CO2 30 20* 27 26  GLUCOSE 134* 93 109* 90  BUN 25* 22* 18 19  CREATININE 1.35* 1.14 1.23 1.13  CALCIUM 9.6 9.0 9.3 9.0    CBG:  Recent Labs Lab 02/08/15 0807 02/09/15 0807 02/10/15 0800 02/11/15 0632  GLUCAP 102* 87 105* 119*    GFR CrCl cannot be calculated (Unknown ideal weight.).  Coagulation profile No results for input(s): INR, PROTIME in the last 168 hours.  Cardiac Enzymes  Recent Labs Lab 02/07/15 2154 02/08/15 0249 02/08/15 1117  TROPONINI <0.03 <0.03 <0.03    Invalid input(s): POCBNP No results for  input(s): DDIMER in the last 72 hours. No results for input(s): HGBA1C in the last 72 hours. No results for input(s): CHOL, HDL, LDLCALC, TRIG, CHOLHDL, LDLDIRECT in the last 72 hours. No results for input(s): TSH, T4TOTAL, T3FREE, THYROIDAB in the last 72 hours.  Invalid input(s): FREET3 No results for input(s): VITAMINB12, FOLATE, FERRITIN, TIBC, IRON, RETICCTPCT in the last 72 hours.  Recent Labs  02/08/15 1640  LIPASE 39    Urine Studies No results for input(s): UHGB, CRYS in the last 72 hours.  Invalid input(s): UACOL, UAPR, USPG, UPH, UTP, UGL, UKET, UBIL, UNIT, UROB, ULEU, UEPI, UWBC, URBC, UBAC, CAST, UCOM, BILUA  MICROBIOLOGY: No results found for this or any previous visit (from the past 240 hour(s)).  RADIOLOGY STUDIES/RESULTS: Ct Abdomen Pelvis Wo Contrast  02/08/2015   CLINICAL DATA:  Generalized abdominal pain with nausea and vomiting.  EXAM: CT ABDOMEN AND PELVIS WITHOUT CONTRAST  TECHNIQUE: Multidetector CT imaging of the abdomen  and pelvis was performed following the standard protocol without IV contrast.  COMPARISON:  01/29/2008  FINDINGS: Motion artifact with mild atelectasis in the lung bases. No pleural effusion. Three-vessel coronary artery calcification.  Multiple small gallstones are again seen. There is no biliary dilatation. The liver, spleen, adrenal glands, and pancreas are unremarkable. Multiple low-density renal lesions are again seen bilaterally compatible with cysts. The largest measures 9.4 cm in the right kidney and has mildly increased in size from the prior study. There are punctate, nonobstructing lower pole renal calculi bilaterally.  Diffuse aortoiliac atherosclerotic calcification is again seen. Displacement of minimal calcification centrally within the proximal abdominal aorta is unchanged and may reflect a short segment dissection. Infrarenal abdominal aortic aneurysm has mildly enlarged and now measures 4.6 x 4.1 cm (previously 3.5 cm). The common  iliac arteries measure up to 1.7 cm on the right and 1.8 cm on the left, stable to minimally increased from prior.  Predominantly left-sided colonic diverticulosis is present without evidence of diverticulitis. Appendix is unremarkable. There is no evidence of bowel obstruction.  There are bilateral fat containing inguinal hernias. The prostate remains prominently enlarged, measuring 7.2 x 7.0 cm. There is moderate distention of the bladder. A 6 mm stone is present posteriorly in the bladder lumen, new from the prior study. No free fluid or enlarged lymph nodes are identified. Grade 1 anterolisthesis is again seen of L4 on L5 and appears facet mediated. Mild, chronic L2 compression fracture demonstrates minimally progressive height loss.  IMPRESSION: 1. Cholelithiasis. 2. Punctate nonobstructing bilateral renal calculi. 3. New 6 mm stone in the bladder. 4. 4.6 cm infrarenal abdominal aortic aneurysm, enlarged since 2009.   Electronically Signed   By: Logan Bores M.D.   On: 02/08/2015 18:32   Dg Chest 2 View  02/07/2015   CLINICAL DATA:  Change in mental status, multiple falls  EXAM: CHEST  2 VIEW  COMPARISON:  01/24/2015 chest radiograph and thoracic spine radiographs  FINDINGS: Evidence of CABG. Mild enlargement of the cardiomediastinal silhouette is noted. Lungs are hypoaerated with crowding of the bronchovascular markings. No new focal pulmonary opacity allowing for hypoaeration. No pleural effusions. Multiple lower thoracic central endplate depression deformities are reidentified, not significantly changed.  IMPRESSION: Low volume exam without focal acute finding.   Electronically Signed   By: Conchita Paris M.D.   On: 02/07/2015 18:07   Dg Ribs Unilateral W/chest Left  01/24/2015   CLINICAL DATA:  Pain on the left side.  Initial encounter.  EXAM: LEFT RIBS AND CHEST - 3+ VIEW  COMPARISON:  01/07/2013  FINDINGS: Hypoventilation with interstitial crowding at the bases. Stable cardiomegaly and aortic  contours in this patient post CABG.  Underpenetration limits evaluation of the lower ribs, especially below rib 7. No appreciable fracture or rib lesion.  There is no edema, consolidation, effusion, or pneumothorax.  IMPRESSION: 1. No evidence of left rib fracture. 2. Technical factors significantly limit lower rib visualization. 3. No evidence of pneumothorax or hemothorax.   Electronically Signed   By: Monte Fantasia M.D.   On: 01/24/2015 15:47   Dg Cervical Spine 2 Or 3 Views  01/24/2015   CLINICAL DATA:  Neck pain  EXAM: CERVICAL SPINE - 2-3 VIEW  COMPARISON:  None.  FINDINGS: The C1 through C6 vertebral bodies are well visualized. C7 and T1 vertebral bodies are obscured by overlying osseous and soft tissue structures.  Degenerative changes are seen throughout the cervical spine, at least moderate in degree throughout, with associated disc  space narrowings and osseous spurring. There is a minimal anterolisthesis of C3, likely related to the underlying degenerative changes. Alignment of the C1 through C6 vertebral bodies appear otherwise normal. No fracture line or displaced fracture fragment identified. Posterior elements of the cervical spine appear grossly well aligned throughout.  There is some irregularity at the cervical thoracic junction which is poorly seen due to overlying osseous and soft tissue structures. Fracture or dislocation cannot be excluded at these levels.  IMPRESSION: Limited study, as detailed above. C7 vertebral body and upper thoracic vertebral bodies are poorly seen due to overlying osseous and soft tissue structures. There may be a slight deformity/displacement of an upper thoracic vertebral body but not convincing. If symptomatic at these levels, would recommend CT for more definitive characterization.  C1 through C6 vertebral bodies appear intact without evidence of acute fracture or subluxation.  Fairly extensive degenerative changes throughout the cervical spine.   Electronically  Signed   By: Franki Cabot M.D.   On: 01/24/2015 15:50   Dg Thoracic Spine 2 View  01/24/2015   CLINICAL DATA:  Thoracic pain following fall 2 weeks ago. Initial encounter.  EXAM: THORACIC SPINE 2 VIEWS  COMPARISON:  02/2013 and prior chest radiographs  FINDINGS: There is no evidence of acute thoracic spine fracture or subluxation.  Mild multilevel degenerative disc disease noted.  No focal bony lesions are identified.  IMPRESSION: No acute thoracic spine abnormality.   Electronically Signed   By: Margarette Canada M.D.   On: 01/24/2015 15:46   Ct Head Wo Contrast  02/07/2015   CLINICAL DATA:  Fall.  EXAM: CT HEAD WITHOUT CONTRAST  CT CERVICAL SPINE WITHOUT CONTRAST  TECHNIQUE: Multidetector CT imaging of the head and cervical spine was performed following the standard protocol without intravenous contrast. Multiplanar CT image reconstructions of the cervical spine were also generated.  COMPARISON:  CT scan of cervical spine of January 24, 2015; CT scan of head of December 20, 2012.  FINDINGS: CT HEAD FINDINGS  Bony calvarium appears intact. Mild diffuse cortical atrophy is noted. Mild chronic ischemic white matter disease is noted. No mass effect or midline shift is noted. Ventricular size is within normal limits. There is no evidence of mass lesion, hemorrhage or acute infarction.  CT CERVICAL SPINE FINDINGS  No fracture is noted. Moderate degenerative disc disease is noted at C3-4 and C4-5, with severe degenerative disc disease seen at C5-6, C6-7 C7-T1. Grade 1 anterolisthesis is noted at C3-4, C4-5 and C5-6 secondary to posterior facet joint hypertrophy. Visualized lung apices appear normal.  IMPRESSION: Mild diffuse cortical atrophy. Mild chronic ischemic white matter disease. No acute intracranial abnormality seen.  Moderate to severe multilevel degenerative disc disease is noted in the cervical spine. No acute abnormality is noted.   Electronically Signed   By: Marijo Conception, M.D.   On: 02/07/2015 17:48   Ct  Cervical Spine Wo Contrast  02/07/2015   CLINICAL DATA:  Fall.  EXAM: CT HEAD WITHOUT CONTRAST  CT CERVICAL SPINE WITHOUT CONTRAST  TECHNIQUE: Multidetector CT imaging of the head and cervical spine was performed following the standard protocol without intravenous contrast. Multiplanar CT image reconstructions of the cervical spine were also generated.  COMPARISON:  CT scan of cervical spine of January 24, 2015; CT scan of head of December 20, 2012.  FINDINGS: CT HEAD FINDINGS  Bony calvarium appears intact. Mild diffuse cortical atrophy is noted. Mild chronic ischemic white matter disease is noted. No mass effect or midline shift  is noted. Ventricular size is within normal limits. There is no evidence of mass lesion, hemorrhage or acute infarction.  CT CERVICAL SPINE FINDINGS  No fracture is noted. Moderate degenerative disc disease is noted at C3-4 and C4-5, with severe degenerative disc disease seen at C5-6, C6-7 C7-T1. Grade 1 anterolisthesis is noted at C3-4, C4-5 and C5-6 secondary to posterior facet joint hypertrophy. Visualized lung apices appear normal.  IMPRESSION: Mild diffuse cortical atrophy. Mild chronic ischemic white matter disease. No acute intracranial abnormality seen.  Moderate to severe multilevel degenerative disc disease is noted in the cervical spine. No acute abnormality is noted.   Electronically Signed   By: Marijo Conception, M.D.   On: 02/07/2015 17:48   Ct Cervical Spine Wo Contrast  01/24/2015   CLINICAL DATA:  Patient fell approximate 1 month ago walking home. Complaining of left lateral neck and shoulder pain.  EXAM: CT CERVICAL SPINE WITHOUT CONTRAST  CT THORACIC SPINE WITHOUT CONTRAST  TECHNIQUE: Multidetector CT imaging of the cervical and thoracic spine was performed without contrast. Multiplanar CT image reconstructions were also generated.  COMPARISON:  Current cervical and thoracic radiographs  FINDINGS: CT CERVICAL SPINE FINDINGS  No fracture. No significant spondylolisthesis.  There is a reversal of the normal cervical lordosis centered at C6. Marked loss of disc height at C5-C6 and C7-T1. Moderate loss disc height at C4-C5 and C6-C7. Mild loss disc height at C3-C4. There are facet degenerative changes evident greater on the left from C2-C3 through C5-C6. There are varying degrees of neural foraminal narrowing from uncovertebral and facet spurring. This is greatest on the left at C3-C4, moderate. There is mild central stenosis at C6-C7.  Bones are diffusely demineralized.  There are carotid vascular calcifications bilaterally. Soft tissues otherwise unremarkable.  CT THORACIC SPINE FINDINGS  There is normal vertebral body stature and alignment. No fractures. There are mild disc degenerative changes along the mid and upper thoracic spine reflected by minor loss disc height and small endplate osteophytes. No significant disc bulging. No evidence of a disc herniation. Neural foramina and central spinal canal well preserved.  Surrounding soft tissues show aortic and branch vessel vascular calcifications. Possible partly imaged right renal cyst. No masses or adenopathy. No acute findings in the visualize lungs.  IMPRESSION: CERVICAL CT: No acute fracture or acute finding. There are significant degenerative changes throughout the cervical spine.  THORACIC CT:  No fracture or acute finding.   Electronically Signed   By: Lajean Manes M.D.   On: 01/24/2015 17:27   Ct Thoracic Spine Wo Contrast  01/24/2015   CLINICAL DATA:  Patient fell approximate 1 month ago walking home. Complaining of left lateral neck and shoulder pain.  EXAM: CT CERVICAL SPINE WITHOUT CONTRAST  CT THORACIC SPINE WITHOUT CONTRAST  TECHNIQUE: Multidetector CT imaging of the cervical and thoracic spine was performed without contrast. Multiplanar CT image reconstructions were also generated.  COMPARISON:  Current cervical and thoracic radiographs  FINDINGS: CT CERVICAL SPINE FINDINGS  No fracture. No significant  spondylolisthesis. There is a reversal of the normal cervical lordosis centered at C6. Marked loss of disc height at C5-C6 and C7-T1. Moderate loss disc height at C4-C5 and C6-C7. Mild loss disc height at C3-C4. There are facet degenerative changes evident greater on the left from C2-C3 through C5-C6. There are varying degrees of neural foraminal narrowing from uncovertebral and facet spurring. This is greatest on the left at C3-C4, moderate. There is mild central stenosis at C6-C7.  Bones are diffusely  demineralized.  There are carotid vascular calcifications bilaterally. Soft tissues otherwise unremarkable.  CT THORACIC SPINE FINDINGS  There is normal vertebral body stature and alignment. No fractures. There are mild disc degenerative changes along the mid and upper thoracic spine reflected by minor loss disc height and small endplate osteophytes. No significant disc bulging. No evidence of a disc herniation. Neural foramina and central spinal canal well preserved.  Surrounding soft tissues show aortic and branch vessel vascular calcifications. Possible partly imaged right renal cyst. No masses or adenopathy. No acute findings in the visualize lungs.  IMPRESSION: CERVICAL CT: No acute fracture or acute finding. There are significant degenerative changes throughout the cervical spine.  THORACIC CT:  No fracture or acute finding.   Electronically Signed   By: Lajean Manes M.D.   On: 01/24/2015 17:27   US Scrotum  02/08/2015   CLINICAL DATA:  Testicular pain.  EXAM: SCROTAL ULTRASOUND  DOPPLER ULTRASOUND OF THE TESTICLES  TECHNIQUE: Complete ultrasound examination of the testicles, epididymis, and other scrotal structures was performed. Color and spectral Doppler ultrasound were also utilized to evaluate blood flow to the testicles.  COMPARISON:  None.  FINDINGS: Right testicle  Measurements: 2.4 x 2.4 x 1.9 cm. No mass or microlithiasis visualized.  Left testicle  Measurements: 1.9 x 1.6 x 1.1 cm. No mass or  microlithiasis visualized.  Right epididymis:  4 mm cyst is noted.  Left epididymis:  7 mm cyst is noted.  Hydrocele:  Small right hydrocele is noted.  Varicocele:  Small left varicocele is noted.  Pulsed Doppler interrogation of both testes demonstrates normal low resistance arterial and venous waveforms bilaterally.  IMPRESSION: No evidence of testicular torsion or mass. Small bilateral epididymal cyst. Small right hydrocele. Small left varicocele.   Electronically Signed   By: Marijo Conception, M.D.   On: 02/08/2015 17:54   [place] Art/ven Flow Abd Pelv Doppler  02/08/2015   CLINICAL DATA:  Testicular pain.  EXAM: SCROTAL ULTRASOUND  DOPPLER ULTRASOUND OF THE TESTICLES  TECHNIQUE: Complete ultrasound examination of the testicles, epididymis, and other scrotal structures was performed. Color and spectral Doppler ultrasound were also utilized to evaluate blood flow to the testicles.  COMPARISON:  None.  FINDINGS: Right testicle  Measurements: 2.4 x 2.4 x 1.9 cm. No mass or microlithiasis visualized.  Left testicle  Measurements: 1.9 x 1.6 x 1.1 cm. No mass or microlithiasis visualized.  Right epididymis:  4 mm cyst is noted.  Left epididymis:  7 mm cyst is noted.  Hydrocele:  Small right hydrocele is noted.  Varicocele:  Small left varicocele is noted.  Pulsed Doppler interrogation of both testes demonstrates normal low resistance arterial and venous waveforms bilaterally.  IMPRESSION: No evidence of testicular torsion or mass. Small bilateral epididymal cyst. Small right hydrocele. Small left varicocele.   Electronically Signed   By: Marijo Conception, M.D.   On: 02/08/2015 17:54   Dg Abd Portable 1v  02/08/2015   CLINICAL DATA:  Acute abdominal pain  EXAM: PORTABLE ABDOMEN - 1 VIEW  COMPARISON:  01/29/2008  FINDINGS: No evidence of bowel obstruction or perforation. No concerning calcification or mass effect. Atherosclerotic calcification.  IMPRESSION: Normal bowel gas pattern.   Electronically Signed   By: Monte Fantasia M.D.   On: 02/08/2015 16:24    Oren Binet, MD  [facility] Pager:[phone]  If 7PM-7AM, please contact night-coverage [email or web address] Password [email or web address] 02/11/2015, 11:28 AM   LOS: 4 days

## 2015-02-12 DIAGNOSIS — I10 Essential (primary) hypertension: Secondary | ICD-10-CM

## 2015-02-12 DIAGNOSIS — I2510 Atherosclerotic heart disease of native coronary artery without angina pectoris: Secondary | ICD-10-CM

## 2015-02-12 DIAGNOSIS — Z8673 Personal history of transient ischemic attack (TIA), and cerebral infarction without residual deficits: Secondary | ICD-10-CM

## 2015-02-12 LAB — GLUCOSE, CAPILLARY: Glucose-Capillary: 111 mg/dL — ABNORMAL HIGH (ref 65–99)

## 2015-02-12 MED ORDER — ASPIRIN EC 81 MG PO TBEC: 81.0000 mg | DELAYED_RELEASE_TABLET | Freq: Every day | ORAL | Status: DC

## 2015-02-12 MED ORDER — ASPIRIN EC 81 MG PO TBEC: 81.0000 mg | DELAYED_RELEASE_TABLET | Freq: Every day | ORAL | Status: AC

## 2015-02-12 NOTE — Clinical Social Work Placement (Signed)
   CLINICAL SOCIAL WORK PLACEMENT  NOTE  Date:  02/12/2015  Patient Details  Name: LUCILLE CRICHLOW MRN: [id number] Date of Birth: 1916-10-26  Clinical Social Work is seeking post-discharge placement for this patient at the [place] level of care (*CSW will initial, date and re-position this form in  chart as items are completed):  Yes   Patient/family provided with [facility] Department's list of facilities offering this level of care within the geographic area requested by the patient (or if unable, by the patient's family).  Yes   Patient/family informed of their freedom to choose among providers that offer the needed level of care, that participate in Medicare, Medicaid or managed care program needed by the patient, have an available bed and are willing to accept the patient.  Yes   Patient/family informed of 's ownership interest in [facility] and [facility], as well as of the fact that they are under no obligation to receive care at these facilities.  PASRR submitted to EDS on 02/10/15     PASRR number received on 02/10/15     Existing PASRR number confirmed on       FL2 transmitted to all facilities in geographic area requested by pt/family on 02/10/15     FL2 transmitted to all facilities within larger geographic area on       Patient informed that his/her managed care company has contracts with or will negotiate with certain facilities, including the following:        Yes   Patient/family informed of bed offers received.  Patient chooses bed at  ([place] )     Physician recommends and patient chooses bed at      Patient to be transferred to  ([place] ) on 02/12/15.  Patient to be transferred to facility by  [name] )     Patient family notified on 02/12/15 of transfer.  Name of family member notified:   (Pt's significant other and dtr, [name] and  [name])     PHYSICIAN Please prepare priority discharge summary, including medications, Please prepare prescriptions     Additional Comment:    _______________________________________________ Glendon Axe, MSW, LCSWA [phone] 02/12/2015 10:54 AM

## 2015-02-12 NOTE — Progress Notes (Signed)
Patient ID: Larry Bradford, male   DOB: [birth year], 81 y.o.   MRN: [id number]    Subjective: Foley continues to drain well.  The urine is almost clear.  He was irrigated x 2 last night without clots.   ROS:  Review of Systems  Unable to perform ROS: other    Anti-infectives: Anti-infectives    Start     Dose/Rate Route Frequency Ordered Stop   02/08/15 1515  ceFAZolin (ANCEF) IVPB 1 g/50 mL premix  Status:  Discontinued     1 g 100 mL/hr over 30 Minutes Intravenous 3 times per day 02/08/15 1513 02/09/15 1122   02/07/15 2200  cephALEXin (KEFLEX) capsule 500 mg  Status:  Discontinued     500 mg Oral 3 times daily 02/07/15 2047 02/08/15 1512      Current Facility-Administered Medications  Medication Dose Route Frequency Provider Last Rate Last Dose  . acetaminophen (TYLENOL) tablet 500 mg  500 mg Oral Q6H PRN Ivor Costa, MD   500 mg at 02/08/15 1455  . finasteride (PROSCAR) tablet 5 mg  5 mg Oral Daily Irine Seal, MD   5 mg at 02/11/15 0949  . levETIRAcetam (KEPPRA) tablet 500 mg  500 mg Oral BID Jonetta Osgood, MD   500 mg at 02/11/15 2239  . lidocaine (XYLOCAINE) 2 % jelly 1 application  1 application Urethral Once Jonetta Osgood, MD      . metoprolol succinate (TOPROL-XL) 24 hr tablet 25 mg  25 mg Oral Daily Thompson Grayer, MD   25 mg at 02/11/15 0949  . ondansetron (ZOFRAN) injection 4 mg  4 mg Intravenous Q6H PRN Samella Parr, NP      . pantoprazole (PROTONIX) EC tablet 40 mg  40 mg Oral QHS Kris Mouton, RPH   40 mg at 02/11/15 2239  . sodium chloride 0.9 % injection 3 mL  3 mL Intravenous Q12H Ivor Costa, MD   3 mL at 02/11/15 0953  . tamsulosin (FLOMAX) capsule 0.4 mg  0.4 mg Oral QPC supper Jonetta Osgood, MD   0.4 mg at 02/11/15 1848  . triamcinolone cream (KENALOG) 0.1 % 1 application  1 application Topical BID Ivor Costa, MD   1 application at 09/38/18 2239     Objective: Vital signs in last 24 hours: Temp:  [97.6 F (36.4 C)-98.4 F (36.9 C)] 98.2 F (36.8  C) (09/14 0520) Pulse Rate:  [57-105] 57 (09/14 0520) Resp:  [18-20] 18 (09/14 0520) BP: (104-132)/(58-85) 132/85 mmHg (09/14 0520) SpO2:  [95 %-100 %] 95 % (09/14 0520)  Intake/Output from previous day: 09/13 0701 - 09/14 0700 In: 980 [P.O.:480] Out: 950 [Urine:950] Intake/Output this shift: Total I/O In: 500 [Other:500] Out: 950 [Urine:950]   Physical Exam  Constitutional: He is well-developed, well-nourished, and in no distress.    Lab Results:   Recent Labs  02/09/15 0700  WBC 7.5  HGB 13.1  HCT 40.6  PLT 200   BMET  Recent Labs  02/09/15 0700  NA 144  K 3.9  CL 109  CO2 26  GLUCOSE 90  BUN 19  CREATININE 1.13  CALCIUM 9.0   PT/INR No results for input(s): LABPROT, INR in the last 72 hours. ABG No results for input(s): PHART, HCO3 in the last 72 hours.  Invalid input(s): PCO2, PO2  Studies/Results: No results found.  No new results to review.  Discussed case with nurse.   Assessment and Plan: No further significant active bleeding.    Will  need f/u in my office as noted on f/u request       LOS: 5 days    Travontae Freiberger J 02/12/2015 [phone]

## 2015-02-12 NOTE — Clinical Social Work Note (Signed)
Clinical Social Worker facilitated patient discharge including contacting patient family and facility to confirm patient discharge plans.  Clinical information faxed to facility and family agreeable with plan.  CSW arranged ambulance transport via [facility] to [place].  RN to call report prior to discharge [phone].  DC packet prepared and on chart for transport with number for report.   Clinical Social Worker will sign off for now as social work intervention is no longer needed. Please consult [place] again if new need arises.  Glendon Axe, MSW, LCSWA [phone] 02/12/2015 10:58 AM

## 2015-02-12 NOTE — Clinical Social Work Note (Signed)
Per MD, patient is now agreeable to SNF placement. CSW has confirmed discharge plans with patient at bedside as he is agreeable to SNF placement. Family chooses bed at [facility]. Facility has been notified.   Patient will complete admissions paperwork with assistance from family.   FL-2 signed and discharge packet prepared.   CSW remains available as needed.   Glendon Axe, MSW, LCSWA [phone] 02/12/2015 10:52 AM

## 2015-02-12 NOTE — Clinical Social Work Note (Signed)
[employer] authorization obtained: [id number].   Glendon Axe, MSW, LCSWA [phone] 02/12/2015 11:49 AM

## 2015-02-12 NOTE — Progress Notes (Signed)
Discharge orders received, Pt for discharged to [facility]. IV d/c'd. D/c instructions and report called to [facility]. Family at bedside to assist patient with discharge. Transferred to [place] via [facility]. 02/12/15 @ 1256

## 2015-02-24 ENCOUNTER — Encounter (HOSPITAL_COMMUNITY): Payer: Self-pay | Admitting: Emergency Medicine

## 2015-02-24 ENCOUNTER — Emergency Department (HOSPITAL_COMMUNITY)
Admission: EM | Admit: 2015-02-24 | Discharge: 2015-02-24 | Disposition: A | Discharge status: Home / Self Care | Payer: Medicare Other | Attending: Emergency Medicine | Admitting: Emergency Medicine

## 2015-02-24 DIAGNOSIS — I2510 Atherosclerotic heart disease of native coronary artery without angina pectoris: Secondary | ICD-10-CM | POA: Diagnosis not present

## 2015-02-24 DIAGNOSIS — Z792 Long term (current) use of antibiotics: Secondary | ICD-10-CM | POA: Insufficient documentation

## 2015-02-24 DIAGNOSIS — Z8639 Personal history of other endocrine, nutritional and metabolic disease: Secondary | ICD-10-CM | POA: Diagnosis not present

## 2015-02-24 DIAGNOSIS — R339 Retention of urine, unspecified: Secondary | ICD-10-CM | POA: Insufficient documentation

## 2015-02-24 DIAGNOSIS — R34 Anuria and oliguria: Secondary | ICD-10-CM | POA: Insufficient documentation

## 2015-02-24 DIAGNOSIS — Z79899 Other long term (current) drug therapy: Secondary | ICD-10-CM | POA: Diagnosis not present

## 2015-02-24 DIAGNOSIS — H269 Unspecified cataract: Secondary | ICD-10-CM | POA: Insufficient documentation

## 2015-02-24 DIAGNOSIS — Z951 Presence of aortocoronary bypass graft: Secondary | ICD-10-CM | POA: Diagnosis not present

## 2015-02-24 DIAGNOSIS — Z8669 Personal history of other diseases of the nervous system and sense organs: Secondary | ICD-10-CM | POA: Diagnosis not present

## 2015-02-24 DIAGNOSIS — N400 Enlarged prostate without lower urinary tract symptoms: Secondary | ICD-10-CM | POA: Diagnosis not present

## 2015-02-24 DIAGNOSIS — H9190 Unspecified hearing loss, unspecified ear: Secondary | ICD-10-CM | POA: Diagnosis not present

## 2015-02-24 DIAGNOSIS — Z7982 Long term (current) use of aspirin: Secondary | ICD-10-CM | POA: Diagnosis not present

## 2015-02-24 DIAGNOSIS — Z8673 Personal history of transient ischemic attack (TIA), and cerebral infarction without residual deficits: Secondary | ICD-10-CM | POA: Diagnosis not present

## 2015-02-24 DIAGNOSIS — I10 Essential (primary) hypertension: Secondary | ICD-10-CM | POA: Diagnosis not present

## 2015-02-24 NOTE — ED Notes (Signed)
Pt comes in today with a c/o urinary retention. Per EMS facility ([facility]) removed his foley today and pt has been unable to void since. Pt alert and oriented.

## 2015-02-24 NOTE — ED Notes (Signed)
Notified [facility] for transportation back to [facility] and provided discharge teaching with Caryl Pina, LPN at [facility].

## 2015-02-24 NOTE — ED Notes (Signed)
Bladder Scan 300 ML.

## 2015-02-24 NOTE — ED Notes (Signed)
Pt unwilling to allow me to place foley cath. RN aware

## 2015-02-24 NOTE — Discharge Instructions (Signed)
Continue with your daily medications. Follow up with urology for further evaluation of your symptoms and to determine when you can have your catheter removed. Return to the ED as needed if symptoms worsen.  Acute Urinary Retention Acute urinary retention is the temporary inability to urinate. This is a common problem in older men. As men age their prostates become larger and block the flow of urine from the bladder. This is usually a problem that has come on gradually.  HOME CARE INSTRUCTIONS If you are sent home with a Foley catheter and a drainage system, you will need to discuss the best course of action with your health care provider. While the catheter is in, maintain a good intake of fluids. Keep the drainage bag emptied and lower than your catheter. This is so that contaminated urine will not flow back into your bladder, which could lead to a urinary tract infection. There are two main types of drainage bags. One is a large bag that usually is used at night. It has a good capacity that will allow you to sleep through the night without having to empty it. The second type is called a leg bag. It has a smaller capacity, so it needs to be emptied more frequently. However, the main advantage is that it can be attached by a leg strap and can go underneath your clothing, allowing you the freedom to move about or leave your home. Only take over-the-counter or prescription medicines for pain, discomfort, or fever as directed by your health care provider.  SEEK MEDICAL CARE IF:  You develop a low-grade fever.  You experience spasms or leakage of urine with the spasms. SEEK IMMEDIATE MEDICAL CARE IF:   You develop chills or fever.  Your catheter stops draining urine.  Your catheter falls out.  You start to develop increased bleeding that does not respond to rest and increased fluid intake. MAKE SURE YOU:  Understand these instructions.  Will watch your condition.  Will get help right away if  you are not doing well or get worse. Document Released: 08/23/2000 Document Revised: 05/22/2013 Document Reviewed: 10/26/2012 [facility] Patient Information 2015 [place], [place]. This information is not intended to replace advice given to you by your health care provider. Make sure you discuss any questions you have with your health care provider.

## 2015-02-24 NOTE — ED Notes (Signed)
Informed Claiborne Billings PA that patient is moaning and complaining of diffuse abd pain and rectum pain. Dr. Rogue Jury is coming to bedside to assess. Informed patient of this information. Provided ginger ale.

## 2015-02-24 NOTE — ED Provider Notes (Signed)
CSN: [id number]     Arrival date & time 02/24/15  1846 History   First MD Initiated Contact with Patient 02/24/15 1855     Chief Complaint  Patient presents with  . Urinary Retention     (Consider location/radiation/quality/duration/timing/severity/associated sxs/prior Treatment) HPI Comments: 81 year old male with a history of dementia and seizures presents to the emergency department for further evaluation of presumed urinary retention. Patient has a history of gross hematuria with clot retention causing urinary retention. Patient had a 28 [place] coude catheter placed during his most recent admission. This was removed by [facility] today. Patient states that he voided a small amount 2 after removal of his catheter. He denies any abdominal pain. Facility sent the patient to the ED for evaluation of urinary retention as they claim that patient has been unable to void since. Patient denies any known hematuria. Bladder scan reading 300cc in bladder on arrival.  The history is provided by the patient. No language interpreter was used.    Past Medical History  Diagnosis Date  . Seizures     Onset in Sept 2006  . Cataract   . Hard of hearing   . Renal disorder   . Depression   . HTN (hypertension)   . BPH (benign prostatic hyperplasia)   . CAD (coronary artery disease)     s/p CABG in 2002  . Hyperlipidemia   . OSA (obstructive sleep apnea)   . Aortic aneurysm     3 CM in 2007  . Carotid stenosis   . DDD (degenerative disc disease)   . Trigeminal neuralgia   . Lower GI bleed   . Stroke   . Allergy   . Aortic stenosis    Past Surgical History  Procedure Laterality Date  . Coronary artery bypass graft  2002  . Temporal artery biopsy / ligation  2004   Family History  Problem Relation Age of Onset  . CAD Brother   . Heart disease Brother   . Cancer Mother    Social History  Substance Use Topics  . Smoking status: Never Smoker   . Smokeless tobacco: Never Used  .  Alcohol Use: No    Review of Systems  Gastrointestinal: Negative for abdominal pain.  Genitourinary: Positive for decreased urine volume. Negative for hematuria.  All other systems reviewed and are negative.   Allergies  Tylenol  Home Medications   Prior to Admission medications   Medication Sig Start Date End Date Taking? Authorizing Provider  acetaminophen (TYLENOL) 500 MG tablet Take 500 mg by mouth every 6 (six) hours as needed for moderate pain.   Yes Historical Provider, MD  Amino Acids-Protein Hydrolys (FEEDING SUPPLEMENT, PRO-STAT SUGAR FREE 64,) LIQD Take 30 mLs by mouth daily.   Yes Historical Provider, MD  aspirin EC 81 MG tablet Take 1 tablet (81 mg total) by mouth daily. 02/13/15  Yes Shanker Kristeen Mans, MD  ciprofloxacin (CIPRO) 250 MG tablet Take 250 mg by mouth 2 (two) times daily.   Yes Historical Provider, MD  diphenhydrAMINE-zinc acetate (BENADRYL) cream Apply 1 application topically 3 (three) times daily as needed for itching.   Yes Historical Provider, MD  finasteride (PROSCAR) 5 MG tablet Take 1 tablet (5 mg total) by mouth daily. 02/10/15  Yes Shanker Kristeen Mans, MD  HYDROcodone-acetaminophen (NORCO/VICODIN) 5-325 MG per tablet Take 1 tablet by mouth 3 (three) times daily as needed for moderate pain or severe pain.   Yes Historical Provider, MD  levETIRAcetam (KEPPRA)  500 MG tablet Take 1 tablet (500 mg total) by mouth every 12 (twelve) hours. 01/21/15  Yes Comer Locket, PA-C  magnesium hydroxide (MILK OF MAGNESIA) 400 MG/5ML suspension Take 30 mLs by mouth daily.   Yes Historical Provider, MD  metoprolol succinate (TOPROL-XL) 25 MG 24 hr tablet Take 1 tablet (25 mg total) by mouth daily. 02/10/15  Yes Shanker Kristeen Mans, MD  tamsulosin (FLOMAX) 0.4 MG CAPS capsule Take 1 capsule (0.4 mg total) by mouth daily after supper. 02/10/15  Yes Shanker Kristeen Mans, MD  traMADol (ULTRAM) 50 MG tablet Take 50 mg by mouth 3 (three) times daily as needed for moderate pain.   Yes  Historical Provider, MD  fluconazole (DIFLUCAN) 150 MG tablet Take 150 mg by mouth daily.    Historical Provider, MD  phenazopyridine (PYRIDIUM) 200 MG tablet Take 200 mg by mouth 2 (two) times daily.    Historical Provider, MD   BP 139/62 mmHg  Pulse 74  Temp(Src) 98 F (36.7 C) (Oral)  Resp 20  Wt 170 lb (77.111 kg)  SpO2 94%   Physical Exam  Constitutional: He appears well-developed and well-nourished. No distress.  Alert and answering questions appropriately.  HENT:  Head: Normocephalic and atraumatic.  Eyes: Conjunctivae and EOM are normal. No scleral icterus.  Neck: Normal range of motion.  Cardiovascular: Normal rate, regular rhythm and intact distal pulses.   Pulmonary/Chest: Effort normal. No respiratory distress. He has no wheezes.  Abdominal: Soft. He exhibits no distension. There is no tenderness. There is no rebound.  Soft, nontender abdomen.  Musculoskeletal: Normal range of motion.  Neurological: He is alert. He exhibits normal muscle tone. Coordination normal.  Alert, moving all extremities. Patient conversant. Speech is goal oriented.  Skin: Skin is warm and dry. No rash noted. He is not diaphoretic. No erythema. No pallor.  Psychiatric: He has a normal mood and affect. His behavior is normal.  Nursing note and vitals reviewed.   ED Course  Procedures (including critical care time) Labs Review Labs Reviewed - No data to display  Imaging Review No results found.   I have personally reviewed and evaluated these images and lab results as part of my medical decision-making.   EKG Interpretation None      MDM   Final diagnoses:  Urinary retention    81 year old male presents to the emergency department for evaluation of urinary retention. Patient recently had a Foley catheter removed by urology today after was placed for gross hematuria and clot retention. Patient noted to have just over 300 mL of urine in the bladder. Per [facility], patient has had no  urine output since removal of his catheter this AM. Patient began c/o abdominal pain during his ED visit. Catheter was placed with output of 325cc, draining well without blood or clots. Will d/c with foley and instruction for urology follow up. Patient to be transported back to [facility] via [facility].   Filed Vitals:   02/24/15 1848 02/24/15 1854 02/24/15 2001  BP:  120/57 139/62  Pulse:  69 74  Temp:  98.5 F (36.9 C) 98 F (36.7 C)  TempSrc:  Oral Oral  Resp:  16 20  Weight:  170 lb (77.111 kg)   SpO2: 95% 95% 94%     Antonietta Breach, PA-C 02/24/15 2157  Virgel Manifold, MD 03/06/15 [phone]

## 2015-03-05 ENCOUNTER — Emergency Department (HOSPITAL_COMMUNITY)
Admission: EM | Admit: 2015-03-05 | Discharge: 2015-03-05 | Disposition: A | Discharge status: Home / Self Care | Payer: Medicare Other | Attending: Emergency Medicine | Admitting: Emergency Medicine

## 2015-03-05 ENCOUNTER — Encounter (HOSPITAL_COMMUNITY): Payer: Self-pay

## 2015-03-05 DIAGNOSIS — T83091A Other mechanical complication of indwelling urethral catheter, initial encounter: Secondary | ICD-10-CM | POA: Insufficient documentation

## 2015-03-05 DIAGNOSIS — Z8739 Personal history of other diseases of the musculoskeletal system and connective tissue: Secondary | ICD-10-CM | POA: Insufficient documentation

## 2015-03-05 DIAGNOSIS — I2510 Atherosclerotic heart disease of native coronary artery without angina pectoris: Secondary | ICD-10-CM | POA: Diagnosis not present

## 2015-03-05 DIAGNOSIS — T839XXA Unspecified complication of genitourinary prosthetic device, implant and graft, initial encounter: Secondary | ICD-10-CM

## 2015-03-05 DIAGNOSIS — Z79899 Other long term (current) drug therapy: Secondary | ICD-10-CM | POA: Insufficient documentation

## 2015-03-05 DIAGNOSIS — Z7982 Long term (current) use of aspirin: Secondary | ICD-10-CM | POA: Insufficient documentation

## 2015-03-05 DIAGNOSIS — Z792 Long term (current) use of antibiotics: Secondary | ICD-10-CM | POA: Diagnosis not present

## 2015-03-05 DIAGNOSIS — Z8719 Personal history of other diseases of the digestive system: Secondary | ICD-10-CM | POA: Diagnosis not present

## 2015-03-05 DIAGNOSIS — Y846 Urinary catheterization as the cause of abnormal reaction of the patient, or of later complication, without mention of misadventure at the time of the procedure: Secondary | ICD-10-CM | POA: Diagnosis not present

## 2015-03-05 DIAGNOSIS — H9190 Unspecified hearing loss, unspecified ear: Secondary | ICD-10-CM | POA: Diagnosis not present

## 2015-03-05 DIAGNOSIS — N400 Enlarged prostate without lower urinary tract symptoms: Secondary | ICD-10-CM | POA: Diagnosis not present

## 2015-03-05 DIAGNOSIS — Z951 Presence of aortocoronary bypass graft: Secondary | ICD-10-CM | POA: Insufficient documentation

## 2015-03-05 DIAGNOSIS — Z8673 Personal history of transient ischemic attack (TIA), and cerebral infarction without residual deficits: Secondary | ICD-10-CM | POA: Insufficient documentation

## 2015-03-05 DIAGNOSIS — F0390 Unspecified dementia without behavioral disturbance: Secondary | ICD-10-CM | POA: Insufficient documentation

## 2015-03-05 DIAGNOSIS — H269 Unspecified cataract: Secondary | ICD-10-CM | POA: Diagnosis not present

## 2015-03-05 DIAGNOSIS — R319 Hematuria, unspecified: Secondary | ICD-10-CM

## 2015-03-05 DIAGNOSIS — I10 Essential (primary) hypertension: Secondary | ICD-10-CM | POA: Diagnosis not present

## 2015-03-05 DIAGNOSIS — Z87448 Personal history of other diseases of urinary system: Secondary | ICD-10-CM | POA: Diagnosis not present

## 2015-03-05 DIAGNOSIS — G40909 Epilepsy, unspecified, not intractable, without status epilepticus: Secondary | ICD-10-CM | POA: Insufficient documentation

## 2015-03-05 LAB — CBC WITH DIFFERENTIAL/PLATELET
BASOS ABS: 0.1 10*3/uL (ref 0.0–0.1)
BASOS PCT: 1 %
Eosinophils Absolute: 0.3 10*3/uL (ref 0.0–0.7)
Eosinophils Relative: 3 %
HEMATOCRIT: 38.1 % — AB (ref 39.0–52.0)
HEMOGLOBIN: 12.2 g/dL — AB (ref 13.0–17.0)
LYMPHS PCT: 18 %
Lymphs Abs: 1.8 10*3/uL (ref 0.7–4.0)
MCH: 30.7 pg (ref 26.0–34.0)
MCHC: 32 g/dL (ref 30.0–36.0)
MCV: 96 fL (ref 78.0–100.0)
MONO ABS: 0.5 10*3/uL (ref 0.1–1.0)
MONOS PCT: 6 %
NEUTROS ABS: 7 10*3/uL (ref 1.7–7.7)
NEUTROS PCT: 72 %
Platelets: 237 10*3/uL (ref 150–400)
RBC: 3.97 MIL/uL — ABNORMAL LOW (ref 4.22–5.81)
RDW: 13.4 % (ref 11.5–15.5)
WBC: 9.7 10*3/uL (ref 4.0–10.5)

## 2015-03-05 LAB — BASIC METABOLIC PANEL
ANION GAP: 4 — AB (ref 5–15)
BUN: 25 mg/dL — ABNORMAL HIGH (ref 6–20)
CO2: 32 mmol/L (ref 22–32)
Calcium: 9.3 mg/dL (ref 8.9–10.3)
Chloride: 102 mmol/L (ref 101–111)
Creatinine, Ser: 1.25 mg/dL — ABNORMAL HIGH (ref 0.61–1.24)
GFR calc non Af Amer: 47 mL/min — ABNORMAL LOW (ref >60)
GFR, EST AFRICAN AMERICAN: 54 mL/min — AB (ref >60)
GLUCOSE: 100 mg/dL — AB (ref 65–99)
POTASSIUM: 4.6 mmol/L (ref 3.5–5.1)
Sodium: 138 mmol/L (ref 135–145)

## 2015-03-05 LAB — URINALYSIS, ROUTINE W REFLEX MICROSCOPIC
Bilirubin Urine: NEGATIVE
GLUCOSE, UA: NEGATIVE mg/dL
Ketones, ur: NEGATIVE mg/dL
NITRITE: NEGATIVE
PH: 6.5 (ref 5.0–8.0)
Protein, ur: 100 mg/dL — AB
SPECIFIC GRAVITY, URINE: 1.017 (ref 1.005–1.030)
Urobilinogen, UA: 0.2 mg/dL (ref 0.0–1.0)

## 2015-03-05 LAB — URINE MICROSCOPIC-ADD ON

## 2015-03-05 NOTE — ED Provider Notes (Signed)
Catheter working, d/c'd. Return precautions discussed.  Leo Grosser, MD 03/05/15 [phone]

## 2015-03-05 NOTE — ED Notes (Signed)
Facility made aware of blood in foley cath. No clots at time of departure. Foley patent

## 2015-03-05 NOTE — ED Notes (Signed)
Per [facility]- Pt resides at [facility]. FULL CODE. Per Nursing Staff. Pt normal baseline, pt is confused. Pt has indwelling cath. Pt has blood around penis and in foley. EDP [place] present to evaluated this pt upon arrival.

## 2015-03-05 NOTE — ED Notes (Signed)
I irrigate his foley with 120 cc sterile n.s.s. With sterile technique.  Prior to irrigation he was draining what was in appearance as venous blood.  I found to instillation of the saline to be easy and without apparent obstruction.  Two small blood clots were visualized in the return urine; and pt. Tol. This procedure well.

## 2015-03-05 NOTE — ED Provider Notes (Signed)
CSN: [id number]     Arrival date & time 03/05/15  1308 History   First MD Initiated Contact with Patient 03/05/15 1315     Chief Complaint  Patient presents with  . BLOOD AROUND FOLEY CATHETER      (Consider location/radiation/quality/duration/timing/severity/associated sxs/prior Treatment) HPI  81 year old male from his living facility with blood around and in his foley. Had foley placed for urinary retention. Today staff noticed blood in foley. They called [facility] and were told to deflate balloon and advance catheter. When they did there was a lot more blood in the foley so he was sent here for evaluation. Per family at the bedside patient is acting at his normal mental baseline. Does seem to tug at the foley often.  Past Medical History  Diagnosis Date  . Seizures ([place])     Onset in Sept 2006  . Cataract   . Hard of hearing   . Renal disorder   . Depression   . HTN (hypertension)   . BPH (benign prostatic hyperplasia)   . CAD (coronary artery disease)     s/p CABG in 2002  . Hyperlipidemia   . OSA (obstructive sleep apnea)   . Aortic aneurysm ([place])     3 CM in 2007  . Carotid stenosis   . DDD (degenerative disc disease)   . Trigeminal neuralgia   . Lower GI bleed   . Stroke ([place])   . Allergy   . Aortic stenosis    Past Surgical History  Procedure Laterality Date  . Coronary artery bypass graft  2002  . Temporal artery biopsy / ligation  2004   Family History  Problem Relation Age of Onset  . CAD Brother   . Heart disease Brother   . Cancer Mother    Social History  Substance Use Topics  . Smoking status: Never Smoker   . Smokeless tobacco: Never Used  . Alcohol Use: No    Review of Systems  Unable to perform ROS: Dementia      Allergies  Tylenol  Home Medications   Prior to Admission medications   Medication Sig Start Date End Date Taking? Authorizing Provider  acetaminophen (TYLENOL) 500 MG tablet Take 500 mg by mouth every 6 (six)  hours as needed for moderate pain.    Historical Provider, MD  Amino Acids-Protein Hydrolys (FEEDING SUPPLEMENT, PRO-STAT SUGAR FREE 64,) LIQD Take 30 mLs by mouth daily.    Historical Provider, MD  aspirin EC 81 MG tablet Take 1 tablet (81 mg total) by mouth daily. 02/13/15   Shanker Kristeen Mans, MD  ciprofloxacin (CIPRO) 250 MG tablet Take 250 mg by mouth 2 (two) times daily.    Historical Provider, MD  diphenhydrAMINE-zinc acetate (BENADRYL) cream Apply 1 application topically 3 (three) times daily as needed for itching.    Historical Provider, MD  finasteride (PROSCAR) 5 MG tablet Take 1 tablet (5 mg total) by mouth daily. 02/10/15   Shanker Kristeen Mans, MD  fluconazole (DIFLUCAN) 150 MG tablet Take 150 mg by mouth daily.    Historical Provider, MD  HYDROcodone-acetaminophen (NORCO/VICODIN) 5-325 MG per tablet Take 1 tablet by mouth 3 (three) times daily as needed for moderate pain or severe pain.    Historical Provider, MD  levETIRAcetam (KEPPRA) 500 MG tablet Take 1 tablet (500 mg total) by mouth every 12 (twelve) hours. 01/21/15   Comer Locket, PA-C  magnesium hydroxide (MILK OF MAGNESIA) 400 MG/5ML suspension Take 30 mLs by mouth daily.  Historical Provider, MD  metoprolol succinate (TOPROL-XL) 25 MG 24 hr tablet Take 1 tablet (25 mg total) by mouth daily. 02/10/15   Shanker Kristeen Mans, MD  phenazopyridine (PYRIDIUM) 200 MG tablet Take 200 mg by mouth 2 (two) times daily.    Historical Provider, MD  tamsulosin (FLOMAX) 0.4 MG CAPS capsule Take 1 capsule (0.4 mg total) by mouth daily after supper. 02/10/15   Shanker Kristeen Mans, MD  traMADol (ULTRAM) 50 MG tablet Take 50 mg by mouth 3 (three) times daily as needed for moderate pain.    Historical Provider, MD   BP 117/83 mmHg  Pulse 54  Temp(Src) 97.8 F (36.6 C) (Oral)  Resp 18  SpO2 94% Physical Exam  Constitutional: He appears well-developed and well-nourished.  HENT:  Head: Normocephalic and atraumatic.  Right Ear: External ear normal.   Left Ear: External ear normal.  Nose: Nose normal.  Eyes: Right eye exhibits no discharge. Left eye exhibits no discharge.  Neck: Neck supple.  Cardiovascular: Normal rate, regular rhythm, normal heart sounds and intact distal pulses.   Pulmonary/Chest: Effort normal and breath sounds normal.  Abdominal: Soft. He exhibits no distension. There is no tenderness.  Genitourinary:  Foley catheter with blood. Some blood at urethra. No penile tenderness.  Musculoskeletal: He exhibits no edema.  Neurological: He is alert. He is disoriented.  Skin: Skin is warm and dry.  Nursing note and vitals reviewed.   ED Course  Procedures (including critical care time) Labs Review Labs Reviewed  URINALYSIS, ROUTINE W REFLEX MICROSCOPIC (NOT AT [facility]) - Abnormal; Notable for the following:    Color, Urine RED (*)    APPearance TURBID (*)    Hgb urine dipstick LARGE (*)    Protein, ur 100 (*)    Leukocytes, UA SMALL (*)    All other components within normal limits  BASIC METABOLIC PANEL - Abnormal; Notable for the following:    Glucose, Bld 100 (*)    BUN 25 (*)    Creatinine, Ser 1.25 (*)    GFR calc non Af Amer 47 (*)    GFR calc Af Amer 54 (*)    Anion gap 4 (*)    All other components within normal limits  CBC WITH DIFFERENTIAL/PLATELET - Abnormal; Notable for the following:    RBC 3.97 (*)    Hemoglobin 12.2 (*)    HCT 38.1 (*)    All other components within normal limits  URINE CULTURE  URINE MICROSCOPIC-ADD ON    Imaging Review No results found. I have personally reviewed and evaluated these images and lab results as part of my medical decision-making.   EKG Interpretation None      MDM   Final diagnoses:  None    Patient is at his normal baseline and his call at this time. No signs of altered mental status or sepsis. No signs of a urinary tract infection. The hematuria is most likely to him pulling on the catheter with likely localized trauma. A new Foley was placed but  there is still some clot and thus it was irrigated. Some urine now starting to come out but if it becomes clotted again he will need a triple lumen catheter for continuous bladder irrigation. If catheter continues to drain he can be sent back to his facility. Care transferred to Dr. Laneta Simmers with final dispo pending.    Sherwood Gambler, MD 03/05/15 [phone]

## 2015-03-05 NOTE — ED Notes (Signed)
Bed: WA18 Expected date:  Expected time:  Means of arrival:  Comments: Hold 

## 2015-03-05 NOTE — ED Notes (Signed)
MD at bedside. 

## 2015-03-05 NOTE — ED Notes (Signed)
MD at bedside. EDP KNOTT PRESENT TO SPEAK WITH PT AND FAMILY

## 2015-03-05 NOTE — ED Notes (Signed)
Dillon Bjork RN to irrigate foley with 120cc (91ml x 2). Pt tolerated.

## 2015-03-05 NOTE — ED Notes (Signed)
Bed: RESA Expected date:  Expected time:  Means of arrival:  Comments: Ems

## 2015-03-05 NOTE — Discharge Instructions (Signed)
Hematuria, Adult Hematuria is blood in your urine. It can be caused by a bladder infection, kidney infection, prostate infection, kidney stone, or cancer of your urinary tract. Infections can usually be treated with medicine, and a kidney stone usually will pass through your urine. If neither of these is the cause of your hematuria, further workup to find out the reason may be needed. It is very important that you tell your health care provider about any blood you see in your urine, even if the blood stops without treatment or happens without causing pain. Blood in your urine that happens and then stops and then happens again can be a symptom of a very serious condition. Also, pain is not a symptom in the initial stages of many urinary cancers. HOME CARE INSTRUCTIONS   Drink lots of fluid, 3-4 quarts a day. If you have been diagnosed with an infection, cranberry juice is especially recommended, in addition to large amounts of water.  Avoid caffeine, tea, and carbonated beverages because they tend to irritate the bladder.  Avoid alcohol because it may irritate the prostate.  Take all medicines as directed by your health care provider.  If you were prescribed an antibiotic medicine, finish it all even if you start to feel better.  If you have been diagnosed with a kidney stone, follow your health care provider's instructions regarding straining your urine to catch the stone.  Empty your bladder often. Avoid holding urine for long periods of time.  After a bowel movement, women should cleanse front to back. Use each tissue only once.  Empty your bladder before and after sexual intercourse if you are a male. SEEK MEDICAL CARE IF: 1. You develop back pain. 2. You have a fever. 3. You have a feeling of sickness in your stomach (nausea) or vomiting. 4. Your symptoms are not better in 3 days. Return sooner if you are getting worse. SEEK IMMEDIATE MEDICAL CARE IF:  1. You develop severe vomiting  and are unable to keep the medicine down. 2. You develop severe back or abdominal pain despite taking your medicines. 3. You begin passing a large amount of blood or clots in your urine. 4. You feel extremely weak or faint, or you pass out. MAKE SURE YOU:  1. Understand these instructions. 2. Will watch your condition. 3. Will get help right away if you are not doing well or get worse.   This information is not intended to replace advice given to you by your health care provider. Make sure you discuss any questions you have with your health care provider.   Document Released: 05/17/2005 Document Revised: 06/07/2014 Document Reviewed: 01/15/2013 Elsevier Interactive Patient Education 2016 [place], Adult A Foley catheter is a soft, flexible tube that is placed into the bladder to drain urine. A Foley catheter may be inserted if:  You leak urine or are not able to control when you urinate (urinary incontinence).  You are not able to urinate when you need to (urinary retention).  You had prostate surgery or surgery on the genitals.  You have certain medical conditions, such as multiple sclerosis, dementia, or a spinal cord injury. If you are going home with a Foley catheter in place, follow the instructions below. TAKING CARE OF THE CATHETER 5. Wash your hands with soap and water. 6. Using mild soap and warm water on a clean washcloth:  Clean the area on your body closest to the catheter insertion site using a circular motion, moving  away from the catheter. Never wipe toward the catheter because this could sweep bacteria up into the urethra and cause infection.  Remove all traces of soap. Pat the area dry with a clean towel. For males, reposition the foreskin. 7. Attach the catheter to your leg so there is no tension on the catheter. Use adhesive tape or a leg strap. If you are using adhesive tape, remove any sticky residue left behind by the previous tape you  used. 8. Keep the drainage bag below the level of the bladder, but keep it off the floor. 9. Check throughout the day to be sure the catheter is working and urine is draining freely. Make sure the tubing does not become kinked. 10. Do not pull on the catheter or try to remove it. Pulling could damage internal tissues. TAKING CARE OF THE DRAINAGE BAGS You will be given two drainage bags to take home. One is a large overnight drainage bag, and the other is a smaller leg bag that fits underneath clothing. You may wear the overnight bag at any time, but you should never wear the smaller leg bag at night. Follow the instructions below for how to empty, change, and clean your drainage bags. Emptying the Drainage Bag You must empty your drainage bag when it is  - full or at least 2-3 times a day. 5. Wash your hands with soap and water. 6. Keep the drainage bag below your hips, below the level of your bladder. This stops urine from going back into the tubing and into your bladder. 7. Hold the dirty bag over the toilet or a clean container. 8. Open the pour spout at the bottom of the bag and empty the urine into the toilet or container. Do not let the pour spout touch the toilet, container, or any other surface. Doing so can place bacteria on the bag, which can cause an infection. 9. Clean the pour spout with a gauze pad or cotton ball that has rubbing alcohol on it. 10. Close the pour spout. 11. Attach the bag to your leg with adhesive tape or a leg strap. 12. Wash your hands well. Changing the Drainage Bag Change your drainage bag once a month or sooner if it starts to smell bad or look dirty. Below are steps to follow when changing the drainage bag. 4. Wash your hands with soap and water. 5. Pinch off the rubber catheter so that urine does not spill out. 6. Disconnect the catheter tube from the drainage tube at the connection valve. Do not let the tubes touch any surface. 7. Clean the end of the  catheter tube with an alcohol wipe. Use a different alcohol wipe to clean the end of the drainage tube. 8. Connect the catheter tube to the drainage tube of the clean drainage bag. 9. Attach the new bag to the leg with adhesive tape or a leg strap. Avoid attaching the new bag too tightly. 10. Wash your hands well. Cleaning the Drainage Bag 1. Wash your hands with soap and water. 2. Wash the bag in warm, soapy water. 3. Rinse the bag thoroughly with warm water. 4. Fill the bag with a solution of white vinegar and water (1 cup vinegar to 1 qt warm water [.2 L vinegar to 1 L warm water]). Close the bag and soak it for 30 minutes in the solution. 5. Rinse the bag with warm water. 6. Hang the bag to dry with the pour spout open and hanging downward. 7. Store  the clean bag (once it is dry) in a clean plastic bag. 8. Wash your hands well. PREVENTING INFECTION  Wash your hands before and after handling your catheter.  Take showers daily and wash the area where the catheter enters your body. Do not take baths. Replace wet leg straps with dry ones, if this applies.  Do not use powders, sprays, or lotions on the genital area. Only use creams, lotions, or ointments as directed by your caregiver.  For females, wipe from front to back after each bowel movement.  Drink enough fluids to keep your urine clear or pale yellow unless you have a fluid restriction.  Do not let the drainage bag or tubing touch or lie on the floor.  Wear cotton underwear to absorb moisture and to keep your skin drier. SEEK MEDICAL CARE IF:   Your urine is cloudy or smells unusually bad.  Your catheter becomes clogged.  You are not draining urine into the bag or your bladder feels full.  Your catheter starts to leak. SEEK IMMEDIATE MEDICAL CARE IF:   You have pain, swelling, redness, or pus where the catheter enters the body.  You have pain in the abdomen, legs, lower back, or bladder.  You have a fever.  You see  blood fill the catheter, or your urine is pink or red.  You have nausea, vomiting, or chills.  Your catheter gets pulled out. MAKE SURE YOU:   Understand these instructions.  Will watch your condition.  Will get help right away if you are not doing well or get worse.   This information is not intended to replace advice given to you by your health care provider. Make sure you discuss any questions you have with your health care provider.   Document Released: 05/17/2005 Document Revised: 10/01/2013 Document Reviewed: 05/08/2012 Elsevier Interactive Patient Education [employer].

## 2015-03-06 LAB — URINE CULTURE: CULTURE: NO GROWTH

## 2015-03-26 ENCOUNTER — Ambulatory Visit: Payer: Medicare Other | Admitting: Family Medicine

## 2015-04-03 ENCOUNTER — Emergency Department (HOSPITAL_BASED_OUTPATIENT_CLINIC_OR_DEPARTMENT_OTHER)
Admission: EM | Admit: 2015-04-03 | Discharge: 2015-04-03 | Disposition: A | Discharge status: Home / Self Care | Payer: Medicare Other | Source: Home / Self Care | Attending: Emergency Medicine | Admitting: Emergency Medicine

## 2015-04-03 ENCOUNTER — Emergency Department (HOSPITAL_BASED_OUTPATIENT_CLINIC_OR_DEPARTMENT_OTHER): Payer: Medicare Other

## 2015-04-03 ENCOUNTER — Encounter (HOSPITAL_BASED_OUTPATIENT_CLINIC_OR_DEPARTMENT_OTHER): Payer: Self-pay | Admitting: *Deleted

## 2015-04-03 DIAGNOSIS — N390 Urinary tract infection, site not specified: Secondary | ICD-10-CM

## 2015-04-03 DIAGNOSIS — R531 Weakness: Secondary | ICD-10-CM

## 2015-04-03 DIAGNOSIS — T83511A Infection and inflammatory reaction due to indwelling urethral catheter, initial encounter: Secondary | ICD-10-CM

## 2015-04-03 LAB — URINE MICROSCOPIC-ADD ON

## 2015-04-03 LAB — BASIC METABOLIC PANEL
Anion gap: 6 (ref 5–15)
BUN: 14 mg/dL (ref 6–20)
CHLORIDE: 104 mmol/L (ref 101–111)
CO2: 28 mmol/L (ref 22–32)
CREATININE: 1.07 mg/dL (ref 0.61–1.24)
Calcium: 8.7 mg/dL — ABNORMAL LOW (ref 8.9–10.3)
GFR, EST NON AFRICAN AMERICAN: 56 mL/min — AB (ref >60)
Glucose, Bld: 88 mg/dL (ref 65–99)
POTASSIUM: 3.7 mmol/L (ref 3.5–5.1)
Sodium: 138 mmol/L (ref 135–145)

## 2015-04-03 LAB — URINALYSIS, ROUTINE W REFLEX MICROSCOPIC
Bilirubin Urine: NEGATIVE
Glucose, UA: 100 mg/dL — AB
Ketones, ur: NEGATIVE mg/dL
NITRITE: POSITIVE — AB
PH: 5.5 (ref 5.0–8.0)
Protein, ur: 100 mg/dL — AB
SPECIFIC GRAVITY, URINE: 1.024 (ref 1.005–1.030)
UROBILINOGEN UA: 0.2 mg/dL (ref 0.0–1.0)

## 2015-04-03 LAB — CBC WITH DIFFERENTIAL/PLATELET
BASOS ABS: 0 10*3/uL (ref 0.0–0.1)
Basophils Relative: 0 %
EOS ABS: 0.4 10*3/uL (ref 0.0–0.7)
Eosinophils Relative: 5 %
HCT: 37 % — ABNORMAL LOW (ref 39.0–52.0)
HEMOGLOBIN: 12.3 g/dL — AB (ref 13.0–17.0)
LYMPHS ABS: 2.3 10*3/uL (ref 0.7–4.0)
LYMPHS PCT: 27 %
MCH: 31.2 pg (ref 26.0–34.0)
MCHC: 33.2 g/dL (ref 30.0–36.0)
MCV: 93.9 fL (ref 78.0–100.0)
Monocytes Absolute: 0.6 10*3/uL (ref 0.1–1.0)
Monocytes Relative: 7 %
NEUTROS PCT: 61 %
Neutro Abs: 5.1 10*3/uL (ref 1.7–7.7)
Platelets: 212 10*3/uL (ref 150–400)
RBC: 3.94 MIL/uL — AB (ref 4.22–5.81)
RDW: 13.8 % (ref 11.5–15.5)
WBC: 8.4 10*3/uL (ref 4.0–10.5)

## 2015-04-03 MED ORDER — LEVOFLOXACIN 750 MG PO TABS: 750.0000 mg | ORAL_TABLET | Freq: Every day | ORAL | Status: DC

## 2015-04-03 MED ORDER — LEVOFLOXACIN IN D5W 750 MG/150ML IV SOLN
750.0000 mg | Freq: Once | INTRAVENOUS | Status: AC
  Administered 2015-04-03: 750 mg via INTRAVENOUS
  Filled 2015-04-03: qty 150

## 2015-04-03 MED ORDER — IOHEXOL 300 MG/ML  SOLN
25.0000 mL | Freq: Once | INTRAMUSCULAR | Status: AC | PRN
  Administered 2015-04-03: 25 mL via ORAL

## 2015-04-03 MED ORDER — IOHEXOL 300 MG/ML  SOLN
100.0000 mL | Freq: Once | INTRAMUSCULAR | Status: AC | PRN
  Administered 2015-04-03: 100 mL via INTRAVENOUS

## 2015-04-03 NOTE — ED Notes (Signed)
Pt groaning with eyes closed. Wife at bedside says that he has been doing that but won't specify where the pain is.

## 2015-04-03 NOTE — ED Notes (Signed)
Patient transported to CT/XRAY 

## 2015-04-03 NOTE — ED Provider Notes (Signed)
CSN: [id number]     Arrival date & time 04/03/15  1749 History  By signing my name below, I, Tula Nakayama, attest that this documentation has been prepared under the direction and in the presence of Veryl Speak, MD.  Electronically Signed: Tula Nakayama, ED Scribe. 04/03/2015. 6:17 PM.   Chief Complaint  Patient presents with  . Abdominal Pain   The history is provided by the patient and a relative. No language interpreter was used.   HPI Comments: Larry Bradford is a 81 y.o. male with a history of CABG, seizure, CAD, HTN, benign prostatic hyperplasia and dementia who presents to the Emergency Department complaining of decreased body temperature of 91 that was measured orally this afternoon. Per pt's wife, home health measured decreased temperature orally multiple times and referred him to the ED for evaluation. Pt currently complains of generalized weakness that started 3-4 weeks ago and discomfort around his indwelling catheter, which was replaced yesterday. His catheter has been emptying without issue. Pt lives at home with his family. He ambulates with a walker. Pt's family denies abnormal behavior.  Past Medical History  Diagnosis Date  . Seizures ([place])     Onset in Sept 2006  . Cataract   . Hard of hearing   . Renal disorder   . Depression   . HTN (hypertension)   . BPH (benign prostatic hyperplasia)   . CAD (coronary artery disease)     s/p CABG in 2002  . Hyperlipidemia   . OSA (obstructive sleep apnea)   . Aortic aneurysm ([place])     3 CM in 2007  . Carotid stenosis   . DDD (degenerative disc disease)   . Trigeminal neuralgia   . Lower GI bleed   . Stroke ([place])   . Allergy   . Aortic stenosis    Past Surgical History  Procedure Laterality Date  . Coronary artery bypass graft  2002  . Temporal artery biopsy / ligation  2004   Family History  Problem Relation Age of Onset  . CAD Brother   . Heart disease Brother   . Cancer Mother    Social History   Substance Use Topics  . Smoking status: Never Smoker   . Smokeless tobacco: Never Used  . Alcohol Use: No    Review of Systems  Constitutional:       Temperature of 91  Neurological: Positive for weakness.  All other systems reviewed and are negative.     Allergies  Tylenol  Home Medications   Prior to Admission medications   Medication Sig Start Date End Date Taking? Authorizing Provider  Amino Acids-Protein Hydrolys (FEEDING SUPPLEMENT, PRO-STAT SUGAR FREE 64,) LIQD Take 30 mLs by mouth daily.    Historical Provider, MD  aspirin EC 81 MG tablet Take 1 tablet (81 mg total) by mouth daily. 02/13/15   Shanker Kristeen Mans, MD  diphenhydrAMINE-zinc acetate (BENADRYL) cream Apply 1 application topically 3 (three) times daily as needed for itching.    Historical Provider, MD  finasteride (PROSCAR) 5 MG tablet Take 1 tablet (5 mg total) by mouth daily. 02/10/15   Shanker Kristeen Mans, MD  HYDROcodone-acetaminophen (NORCO/VICODIN) 5-325 MG per tablet Take 1 tablet by mouth 3 (three) times daily as needed for moderate pain or severe pain.    Historical Provider, MD  Influenza Vac Split Quad (FLUZONE) 0.25 ML injection Inject 0.25 mLs into the muscle once.    Historical Provider, MD  levETIRAcetam (KEPPRA) 500 MG tablet Take 1 tablet (  500 mg total) by mouth every 12 (twelve) hours. 01/21/15   Comer Locket, PA-C  magnesium hydroxide (MILK OF MAGNESIA) 400 MG/5ML suspension Take 30 mLs by mouth daily.    Historical Provider, MD  metoprolol succinate (TOPROL-XL) 25 MG 24 hr tablet Take 1 tablet (25 mg total) by mouth daily. 02/10/15   Shanker Kristeen Mans, MD  tamsulosin (FLOMAX) 0.4 MG CAPS capsule Take 1 capsule (0.4 mg total) by mouth daily after supper. 02/10/15   Shanker Kristeen Mans, MD  traMADol (ULTRAM) 50 MG tablet Take 50 mg by mouth 3 (three) times daily as needed for moderate pain.    Historical Provider, MD   BP 128/49 mmHg  Pulse 64  Temp(Src) 97.6 F (36.4 C) (Oral)  Resp 20  Ht 5\' 6"   (1.676 m)  Wt 160 lb (72.576 kg)  BMI 25.84 kg/m2  SpO2 93% Physical Exam  Constitutional: He is oriented to person, place, and time. He appears well-developed and well-nourished.  Pt appears chronically ill and somewhat debilitated. He is hard of hearing, however does appear oriented when spoken to.  HENT:  Head: Normocephalic and atraumatic.  Eyes: EOM are normal.  Neck: Normal range of motion.  Cardiovascular: Normal rate, regular rhythm, normal heart sounds and intact distal pulses.   Pulmonary/Chest: Effort normal and breath sounds normal. No respiratory distress.  Abdominal: Soft. He exhibits no distension. There is tenderness. There is no rebound and no guarding.  There is tenderness to palpation in all 4 quadrants.  Genitourinary:  There is an indwelling foley catheter with slight bleeding around it.  Musculoskeletal: Normal range of motion. He exhibits no edema.  Neurological: He is alert and oriented to person, place, and time.  Skin: Skin is warm and dry.  Psychiatric: He has a normal mood and affect. Judgment normal.  Nursing note and vitals reviewed.   ED Course  Procedures  DIAGNOSTIC STUDIES: Oxygen Saturation is 93% on RA, adequate by my interpretation.    COORDINATION OF CARE: 6:12 PM Discussed treatment plan with pt UA, chest x-ray, CT Abdomen and lab work. Pt agreed to plan.  Labs Review Labs Reviewed - No data to display  Imaging Review No results found. I have personally reviewed and evaluated these images and lab results as part of my medical decision-making.  ED ECG REPORT   Date: 04/03/2015  Rate: 57  Rhythm: sinus bradycardia  QRS Axis: normal  Intervals: normal  ST/T Wave abnormalities: nonspecific T wave changes  Conduction Disutrbances:right bundle branch block  Narrative Interpretation:   Old EKG Reviewed: none available  I have personally reviewed the EKG tracing and agree with the computerized printout as noted.   MDM   Final  diagnoses:  None    Patient with history of indwelling Foley catheter related to enlarged prostate. He presents for evaluation of weakness. Workup reveals a urinary tract infection, however no other clear cause. He will be treated with Levaquin and discharged to home. To return as needed for any problems.  I personally performed the services described in this documentation, which was scribed in my presence. The recorded information has been reviewed and is accurate.       Veryl Speak, MD 04/03/15 2211

## 2015-04-03 NOTE — Discharge Instructions (Signed)
Levaquin as prescribed.  Follow-up with your primary Dr. in the next week, and return to the ER if symptoms significantly worsen or change.   Catheter-Associated Urinary Tract Infection FAQs What is "catheter-associated urinary tract infection"? A urinary tract infection (also called "UTI") is an infection in the urinary system, which includes the bladder (which stores the urine) and the kidneys (which filter the blood to make urine). Germs (for example, bacteria or yeasts) do not normally live in these areas; but if germs are introduced, an infection can occur. If you have a urinary catheter, germs can travel along the catheter and cause an infection in your bladder or your kidney; in that case it is called a catheter-associated urinary tract infection (or "CA-UTI").  What is a urinary catheter? A urinary catheter is a thin tube placed in the bladder to drain urine. Urine drains through the tube into a bag that collects the urine. A urinary catheter may be used:  If you are not able to urinate on your own  To measure the amount of urine that you make, for example, during intensive care  During and after some types of surgery  During some tests of the kidneys and bladder People with urinary catheters have a much higher chance of getting a urinary tract infection than people who don't have a catheter. How do I get a catheter-associated urinary tract infection (CA-UTI)? If germs enter the urinary tract, they may cause an infection. Many of the germs that cause a catheter-associated urinary tract infection are common germs found in your intestines that do not usually cause an infection there. Germs can enter the urinary tract when the catheter is being put in or while the catheter remains in the bladder.  What are the symptoms of a urinary tract infection? Some of the common symptoms of a urinary tract infection are:  Burning or pain in the lower abdomen (that is, below the  stomach)  Fever  Bloody urine may be a sign of infection, but is also caused by other problems  Burning during urination or an increase in the frequency of urination after the catheter is removed. Sometimes people with catheter-associated urinary tract infections do not have these symptoms of infection. Can catheter-associated urinary tract infections be treated? Yes, most catheter-associated urinary tract infections can be treated with antibiotics and removal or change of the catheter. Your doctor will determine which antibiotic is best for you.  What are some of the things that hospitals are doing to prevent catheter-associated urinary tract infections? To prevent urinary tract infections, doctors and nurses take the following actions.  Catheter insertion  External catheters in men (these look like condoms and are placed over the penis rather than into the penis)  Putting a temporary catheter in to drain the urine and removing it right away. This is called intermittent urethral catheterization. Catheter care What can I do to help prevent catheter-associated urinary tract infections if I have a catheter?  Always clean your hands before and after doing catheter care.  Always keep your urine bag below the level of your bladder.  Do not tug or pull on the tubing.  Do not twist or kink the catheter tubing.  Ask your healthcare provider each day if you still need the catheter. What do I need to do when I go home from the hospital?  If you will be going home with a catheter, your doctor or nurse should explain everything you need to know about taking care of the  catheter. Make sure you understand how to care for it before you leave the hospital.  If you develop any of the symptoms of a urinary tract infection, such as burning or pain in the lower abdomen, fever, or an increase in the frequency of urination, contact your doctor or nurse immediately.  Before you go home, make sure you  know who to contact if you have questions or problems after you get home. If you have questions, please ask your doctor or nurse. Developed and co-sponsored by [employer] for [place] [id number]); Infectious Diseases Society of [place] (IDSA); [place]; Association for Professionals in Infection Control and Epidemiology (APIC); Centers for Disease Control and Prevention (CDC); and [employer].   This information is not intended to replace advice given to you by your health care provider. Make sure you discuss any questions you have with your health care provider.   Document Released: 02/09/2012 Document Revised: 10/01/2014 Document Reviewed: 07/31/2014 Elsevier Interactive Patient Education [employer].

## 2015-04-03 NOTE — ED Notes (Signed)
Pt to triage in w/c, per pt wife he has indwelling catheter and has been having pain at site for a few days, pt states "I feel so weak". Also reports some abd pain to right lower quad.-

## 2015-04-03 NOTE — ED Notes (Signed)
I positioned patient into bed, took his clothes off and placed gown on him. I then inspected catheter. Family at bedside stated that a new catheter was placed yesterday at his urologist. I drained bag and took urine sample from leg bag. Urine at bedside in case sample needed for lab.  Patient has difficulty hearing.

## 2015-04-06 ENCOUNTER — Emergency Department (HOSPITAL_COMMUNITY): Payer: Medicare Other

## 2015-04-06 ENCOUNTER — Inpatient Hospital Stay (HOSPITAL_COMMUNITY)
Admission: EM | Admit: 2015-04-06 | Discharge: 2015-04-12 | DRG: 698 | Disposition: A | Discharge status: Skilled Nursing Facility | Payer: Medicare Other | Attending: Internal Medicine | Admitting: Internal Medicine

## 2015-04-06 ENCOUNTER — Encounter (HOSPITAL_COMMUNITY): Payer: Self-pay

## 2015-04-06 DIAGNOSIS — Z7982 Long term (current) use of aspirin: Secondary | ICD-10-CM

## 2015-04-06 DIAGNOSIS — K5900 Constipation, unspecified: Secondary | ICD-10-CM | POA: Diagnosis present

## 2015-04-06 DIAGNOSIS — H9190 Unspecified hearing loss, unspecified ear: Secondary | ICD-10-CM | POA: Diagnosis present

## 2015-04-06 DIAGNOSIS — Z8249 Family history of ischemic heart disease and other diseases of the circulatory system: Secondary | ICD-10-CM | POA: Diagnosis not present

## 2015-04-06 DIAGNOSIS — R410 Disorientation, unspecified: Secondary | ICD-10-CM | POA: Diagnosis not present

## 2015-04-06 DIAGNOSIS — G9340 Encephalopathy, unspecified: Secondary | ICD-10-CM | POA: Diagnosis present

## 2015-04-06 DIAGNOSIS — Z8673 Personal history of transient ischemic attack (TIA), and cerebral infarction without residual deficits: Secondary | ICD-10-CM

## 2015-04-06 DIAGNOSIS — R401 Stupor: Secondary | ICD-10-CM

## 2015-04-06 DIAGNOSIS — R627 Adult failure to thrive: Secondary | ICD-10-CM | POA: Diagnosis not present

## 2015-04-06 DIAGNOSIS — N179 Acute kidney failure, unspecified: Secondary | ICD-10-CM | POA: Diagnosis present

## 2015-04-06 DIAGNOSIS — I129 Hypertensive chronic kidney disease with stage 1 through stage 4 chronic kidney disease, or unspecified chronic kidney disease: Secondary | ICD-10-CM | POA: Diagnosis present

## 2015-04-06 DIAGNOSIS — R569 Unspecified convulsions: Secondary | ICD-10-CM | POA: Diagnosis present

## 2015-04-06 DIAGNOSIS — R296 Repeated falls: Secondary | ICD-10-CM | POA: Diagnosis present

## 2015-04-06 DIAGNOSIS — R338 Other retention of urine: Secondary | ICD-10-CM | POA: Diagnosis present

## 2015-04-06 DIAGNOSIS — Z951 Presence of aortocoronary bypass graft: Secondary | ICD-10-CM | POA: Diagnosis not present

## 2015-04-06 DIAGNOSIS — B957 Other staphylococcus as the cause of diseases classified elsewhere: Secondary | ICD-10-CM | POA: Diagnosis present

## 2015-04-06 DIAGNOSIS — Z79899 Other long term (current) drug therapy: Secondary | ICD-10-CM

## 2015-04-06 DIAGNOSIS — I714 Abdominal aortic aneurysm, without rupture, unspecified: Secondary | ICD-10-CM

## 2015-04-06 DIAGNOSIS — N183 Chronic kidney disease, stage 3 (moderate): Secondary | ICD-10-CM | POA: Diagnosis present

## 2015-04-06 DIAGNOSIS — I2510 Atherosclerotic heart disease of native coronary artery without angina pectoris: Secondary | ICD-10-CM | POA: Diagnosis present

## 2015-04-06 DIAGNOSIS — N400 Enlarged prostate without lower urinary tract symptoms: Secondary | ICD-10-CM | POA: Diagnosis not present

## 2015-04-06 DIAGNOSIS — G4733 Obstructive sleep apnea (adult) (pediatric): Secondary | ICD-10-CM | POA: Diagnosis present

## 2015-04-06 DIAGNOSIS — F05 Delirium due to known physiological condition: Secondary | ICD-10-CM | POA: Diagnosis present

## 2015-04-06 DIAGNOSIS — I350 Nonrheumatic aortic (valve) stenosis: Secondary | ICD-10-CM | POA: Diagnosis present

## 2015-04-06 DIAGNOSIS — R109 Unspecified abdominal pain: Secondary | ICD-10-CM

## 2015-04-06 DIAGNOSIS — T83511A Infection and inflammatory reaction due to indwelling urethral catheter, initial encounter: Principal | ICD-10-CM | POA: Diagnosis present

## 2015-04-06 DIAGNOSIS — N401 Enlarged prostate with lower urinary tract symptoms: Secondary | ICD-10-CM | POA: Diagnosis present

## 2015-04-06 DIAGNOSIS — R4182 Altered mental status, unspecified: Secondary | ICD-10-CM | POA: Diagnosis present

## 2015-04-06 DIAGNOSIS — N390 Urinary tract infection, site not specified: Secondary | ICD-10-CM | POA: Diagnosis present

## 2015-04-06 DIAGNOSIS — E785 Hyperlipidemia, unspecified: Secondary | ICD-10-CM | POA: Diagnosis present

## 2015-04-06 DIAGNOSIS — W19XXXA Unspecified fall, initial encounter: Secondary | ICD-10-CM | POA: Diagnosis not present

## 2015-04-06 DIAGNOSIS — I359 Nonrheumatic aortic valve disorder, unspecified: Secondary | ICD-10-CM | POA: Diagnosis not present

## 2015-04-06 DIAGNOSIS — I10 Essential (primary) hypertension: Secondary | ICD-10-CM | POA: Diagnosis present

## 2015-04-06 LAB — CBC WITH DIFFERENTIAL/PLATELET
BASOS ABS: 0 10*3/uL (ref 0.0–0.1)
Basophils Relative: 0 %
Eosinophils Absolute: 0.4 10*3/uL (ref 0.0–0.7)
Eosinophils Relative: 4 %
HEMATOCRIT: 37.6 % — AB (ref 39.0–52.0)
HEMOGLOBIN: 12.6 g/dL — AB (ref 13.0–17.0)
LYMPHS PCT: 25 %
Lymphs Abs: 2.2 10*3/uL (ref 0.7–4.0)
MCH: 31.7 pg (ref 26.0–34.0)
MCHC: 33.5 g/dL (ref 30.0–36.0)
MCV: 94.7 fL (ref 78.0–100.0)
MONO ABS: 0.7 10*3/uL (ref 0.1–1.0)
Monocytes Relative: 9 %
NEUTROS ABS: 5.3 10*3/uL (ref 1.7–7.7)
Neutrophils Relative %: 62 %
Platelets: 198 10*3/uL (ref 150–400)
RBC: 3.97 MIL/uL — ABNORMAL LOW (ref 4.22–5.81)
RDW: 13.9 % (ref 11.5–15.5)
WBC: 8.6 10*3/uL (ref 4.0–10.5)

## 2015-04-06 LAB — URINALYSIS, ROUTINE W REFLEX MICROSCOPIC
Bilirubin Urine: NEGATIVE
GLUCOSE, UA: NEGATIVE mg/dL
KETONES UR: NEGATIVE mg/dL
NITRITE: NEGATIVE
PH: 5.5 (ref 5.0–8.0)
Protein, ur: 30 mg/dL — AB
Specific Gravity, Urine: 1.016 (ref 1.005–1.030)
Urobilinogen, UA: 0.2 mg/dL (ref 0.0–1.0)

## 2015-04-06 LAB — COMPREHENSIVE METABOLIC PANEL
ALK PHOS: 65 U/L (ref 38–126)
ALT: 13 U/L — AB (ref 17–63)
AST: 19 U/L (ref 15–41)
Albumin: 3.3 g/dL — ABNORMAL LOW (ref 3.5–5.0)
Anion gap: 5 (ref 5–15)
BILIRUBIN TOTAL: 0.8 mg/dL (ref 0.3–1.2)
BUN: 17 mg/dL (ref 6–20)
CALCIUM: 9.1 mg/dL (ref 8.9–10.3)
CO2: 28 mmol/L (ref 22–32)
CREATININE: 1.35 mg/dL — AB (ref 0.61–1.24)
Chloride: 104 mmol/L (ref 101–111)
GFR calc Af Amer: 49 mL/min — ABNORMAL LOW (ref >60)
GFR, EST NON AFRICAN AMERICAN: 43 mL/min — AB (ref >60)
Glucose, Bld: 103 mg/dL — ABNORMAL HIGH (ref 65–99)
Potassium: 4.3 mmol/L (ref 3.5–5.1)
Sodium: 137 mmol/L (ref 135–145)
TOTAL PROTEIN: 6.8 g/dL (ref 6.5–8.1)

## 2015-04-06 LAB — I-STAT CG4 LACTIC ACID, ED
Lactic Acid, Venous: 1.2 mmol/L (ref 0.5–2.0)
Lactic Acid, Venous: 0.73 mmol/L (ref 0.5–2.0)

## 2015-04-06 LAB — URINE MICROSCOPIC-ADD ON

## 2015-04-06 LAB — I-STAT TROPONIN, ED
TROPONIN I, POC: 0.01 ng/mL (ref 0.00–0.08)
Troponin i, poc: 0 ng/mL (ref 0.00–0.08)

## 2015-04-06 LAB — MAGNESIUM: MAGNESIUM: 2 mg/dL (ref 1.7–2.4)

## 2015-04-06 MED ORDER — BOOST PO LIQD
237.0000 mL | ORAL | Status: DC
  Administered 2015-04-06: 237 mL via ORAL
  Filled 2015-04-06: qty 237

## 2015-04-06 MED ORDER — TAMSULOSIN HCL 0.4 MG PO CAPS: 0.4000 mg | ORAL_CAPSULE | Freq: Every day | ORAL | Status: DC

## 2015-04-06 MED ORDER — MIRTAZAPINE 7.5 MG PO TABS
7.5000 mg | ORAL_TABLET | Freq: Every day | ORAL | Status: DC
  Administered 2015-04-06: 7.5 mg via ORAL
  Filled 2015-04-06 (×2): qty 1

## 2015-04-06 MED ORDER — TAMSULOSIN HCL 0.4 MG PO CAPS
0.4000 mg | ORAL_CAPSULE | Freq: Every day | ORAL | Status: DC
  Administered 2015-04-06: 0.4 mg via ORAL
  Filled 2015-04-06 (×2): qty 1

## 2015-04-06 MED ORDER — DEXTROSE 5 % IV SOLN: 1.0000 g | INTRAVENOUS | Status: DC

## 2015-04-06 MED ORDER — ASPIRIN EC 81 MG PO TBEC
81.0000 mg | DELAYED_RELEASE_TABLET | Freq: Every day | ORAL | Status: DC
Start: 2015-04-06 — End: 2015-04-07
  Administered 2015-04-06: 81 mg via ORAL
  Filled 2015-04-06 (×2): qty 1

## 2015-04-06 MED ORDER — TRIAMCINOLONE ACETONIDE 0.1 % EX CREA
1.0000 "application " | TOPICAL_CREAM | Freq: Two times a day (BID) | CUTANEOUS | Status: DC
  Administered 2015-04-06 – 2015-04-12 (×12): 1 via TOPICAL
  Filled 2015-04-06 (×2): qty 15

## 2015-04-06 MED ORDER — METOPROLOL SUCCINATE ER 25 MG PO TB24
25.0000 mg | ORAL_TABLET | Freq: Every day | ORAL | Status: DC
  Administered 2015-04-06: 25 mg via ORAL
  Filled 2015-04-06 (×2): qty 1

## 2015-04-06 MED ORDER — TRAMADOL HCL 50 MG PO TABS
50.0000 mg | ORAL_TABLET | Freq: Three times a day (TID) | ORAL | Status: DC | PRN
  Administered 2015-04-06: 50 mg via ORAL
  Filled 2015-04-06: qty 1

## 2015-04-06 MED ORDER — DEXTROSE 5 % IV SOLN
1.0000 g | INTRAVENOUS | Status: DC
  Administered 2015-04-07 – 2015-04-09 (×3): 1 g via INTRAVENOUS
  Filled 2015-04-06 (×4): qty 10

## 2015-04-06 MED ORDER — LEVETIRACETAM 500 MG PO TABS
500.0000 mg | ORAL_TABLET | Freq: Two times a day (BID) | ORAL | Status: DC
  Administered 2015-04-06: 500 mg via ORAL
  Filled 2015-04-06 (×3): qty 1

## 2015-04-06 MED ORDER — FINASTERIDE 5 MG PO TABS
5.0000 mg | ORAL_TABLET | Freq: Every day | ORAL | Status: DC
  Administered 2015-04-06: 5 mg via ORAL
  Filled 2015-04-06 (×2): qty 1

## 2015-04-06 MED ORDER — HEPARIN SODIUM (PORCINE) 5000 UNIT/ML IJ SOLN
5000.0000 [IU] | Freq: Three times a day (TID) | INTRAMUSCULAR | Status: DC
  Administered 2015-04-06 – 2015-04-12 (×17): 5000 [IU] via SUBCUTANEOUS
  Filled 2015-04-06 (×20): qty 1

## 2015-04-06 MED ORDER — DEXTROSE 5 % IV SOLN
1.0000 g | Freq: Once | INTRAVENOUS | Status: AC
  Administered 2015-04-06: 1 g via INTRAVENOUS
  Filled 2015-04-06: qty 10

## 2015-04-06 MED ORDER — MIRABEGRON ER 25 MG PO TB24
25.0000 mg | ORAL_TABLET | Freq: Every day | ORAL | Status: DC
  Administered 2015-04-06: 25 mg via ORAL
  Filled 2015-04-06 (×2): qty 1

## 2015-04-06 NOTE — H&P (Signed)
[facility] History and Physical  Larry Bradford XHB:[id number] DOB: 1917/01/19 DOA: 04/06/2015  Referring physician: EDP PCP: Doristine Devoid, MD   Chief Complaint: AMS   HPI: Larry Bradford is a 81 y.o. male who is brought in by family with delirium.  Patient was diagnosed with UTI 2 days ago, started on levaquin.  Since then he has become increasingly delirious and disoriented today.  Review of Systems: Systems reviewed.  As above, otherwise negative  Past Medical History  Diagnosis Date  . Seizures ([place])     Onset in Sept 2006  . Cataract   . Hard of hearing   . Renal disorder   . Depression   . HTN (hypertension)   . BPH (benign prostatic hyperplasia)   . CAD (coronary artery disease)     s/p CABG in 2002  . Hyperlipidemia   . OSA (obstructive sleep apnea)   . Aortic aneurysm ([place])     3 CM in 2007  . Carotid stenosis   . DDD (degenerative disc disease)   . Trigeminal neuralgia   . Lower GI bleed   . Stroke ([place])   . Allergy   . Aortic stenosis    Past Surgical History  Procedure Laterality Date  . Coronary artery bypass graft  2002  . Temporal artery biopsy / ligation  2004   Social History:  reports that he has never smoked. He has never used smokeless tobacco. He reports that he does not drink alcohol or use illicit drugs.  Allergies  Allergen Reactions  . Benadryl [Diphenhydramine Hcl]     "talks out of his head"  . Oxycodone     Feels crazy  . Tylenol [Acetaminophen]     On MAR    Family History  Problem Relation Age of Onset  . CAD Brother   . Heart disease Brother   . Cancer Mother      Prior to Admission medications   Medication Sig Start Date End Date Taking? Authorizing Provider  aspirin EC 81 MG tablet Take 1 tablet (81 mg total) by mouth daily. 02/13/15  Yes Shanker Kristeen Mans, MD  finasteride (PROSCAR) 5 MG tablet Take 1 tablet (5 mg total) by mouth daily. 02/10/15  Yes Shanker Kristeen Mans, MD  lactose free nutrition (BOOST)  LIQD Take 237 mLs by mouth daily.   Yes Historical Provider, MD  levETIRAcetam (KEPPRA) 500 MG tablet Take 1 tablet (500 mg total) by mouth every 12 (twelve) hours. 01/21/15  Yes Comer Locket, PA-C  metoprolol succinate (TOPROL-XL) 25 MG 24 hr tablet Take 1 tablet (25 mg total) by mouth daily. 02/10/15  Yes Shanker Kristeen Mans, MD  mirabegron ER (MYRBETRIQ) 25 MG TB24 tablet Take 25 mg by mouth daily.   Yes Historical Provider, MD  mirtazapine (REMERON) 7.5 MG tablet Take 7.5 mg by mouth at bedtime.   Yes Historical Provider, MD  tamsulosin (FLOMAX) 0.4 MG CAPS capsule Take 1 capsule (0.4 mg total) by mouth daily after supper. 02/10/15  Yes Shanker Kristeen Mans, MD  traMADol (ULTRAM) 50 MG tablet Take 50 mg by mouth 3 (three) times daily as needed for moderate pain.   Yes Historical Provider, MD  triamcinolone cream (KENALOG) 0.1 % Apply 1 application topically 2 (two) times daily.   Yes Historical Provider, MD   Physical Exam: Filed Vitals:   04/06/15 1840  BP: 121/91  Pulse: 76  Temp: 97.9 F (36.6 C)  Resp: 20    BP 121/91 mmHg  Pulse  76  Temp(Src) 97.9 F (36.6 C) (Oral)  Resp 20  SpO2 97%  General Appearance:    Alert, no distress, appears stated age, Very hard of hearing  Head:    Normocephalic, atraumatic  Eyes:    PERRL, EOMI, sclera non-icteric        Nose:   Nares without drainage or epistaxis. Mucosa, turbinates normal  Throat:   Moist mucous membranes. Oropharynx without erythema or exudate.  Neck:   Supple. No carotid bruits.  No thyromegaly.  No lymphadenopathy.   Back:     No CVA tenderness, no spinal tenderness  Lungs:     Clear to auscultation bilaterally, without wheezes, rhonchi or rales  Chest wall:    No tenderness to palpitation  Heart:    Regular rate and rhythm without murmurs, gallops, rubs  Abdomen:     Soft, non-tender, nondistended, normal bowel sounds, no organomegaly  Genitalia:    deferred  Rectal:    deferred  Extremities:   No clubbing, cyanosis or  edema.  Pulses:   2+ and symmetric all extremities  Skin:   Skin color, texture, turgor normal, no rashes or lesions  Lymph nodes:   Cervical, supraclavicular, and axillary nodes normal  Neurologic:   CNII-XII intact. Normal strength, sensation and reflexes      throughout    Labs on Admission:  Basic Metabolic Panel:  Recent Labs Lab 04/03/15 1835 04/06/15 1643  NA 138 137  K 3.7 4.3  CL 104 104  CO2 28 28  GLUCOSE 88 103*  BUN 14 17  CREATININE 1.07 1.35*  CALCIUM 8.7* 9.1  MG  --  2.0   Liver Function Tests:  Recent Labs Lab 04/06/15 1643  AST 19  ALT 13*  ALKPHOS 65  BILITOT 0.8  PROT 6.8  ALBUMIN 3.3*   No results for input(s): LIPASE, AMYLASE in the last 168 hours. No results for input(s): AMMONIA in the last 168 hours. CBC:  Recent Labs Lab 04/03/15 1835 04/06/15 1643  WBC 8.4 8.6  NEUTROABS 5.1 5.3  HGB 12.3* 12.6*  HCT 37.0* 37.6*  MCV 93.9 94.7  PLT 212 198   Cardiac Enzymes: No results for input(s): CKTOTAL, CKMB, CKMBINDEX, TROPONINI in the last 168 hours.  BNP (last 3 results) No results for input(s): PROBNP in the last 8760 hours. CBG: No results for input(s): GLUCAP in the last 168 hours.  Radiological Exams on Admission: Dg Chest 2 View  04/06/2015  CLINICAL DATA:  [place] today.  Confusion. EXAM: CHEST  2 VIEW COMPARISON:  04/03/2015 FINDINGS: The heart is enlarged but stable. Stable surgical changes from bypass surgery. Low lung volumes with vascular crowding and streaky basilar atelectasis. No definite infiltrates or effusions. The bony thorax is intact. Stable remote compression fracture of L1. IMPRESSION: Stable cardiac enlargement. Low lung volumes with vascular crowding and streaky bibasilar atelectasis. No definite infiltrates, effusions or edema. Electronically Signed   By: Marijo Sanes M.D.   On: 04/06/2015 16:09   Ct Head Wo Contrast  04/06/2015  CLINICAL DATA:  Fall.  Confusion. EXAM: CT HEAD WITHOUT CONTRAST CT CERVICAL SPINE  WITHOUT CONTRAST TECHNIQUE: Multidetector CT imaging of the head and cervical spine was performed following the standard protocol without intravenous contrast. Multiplanar CT image reconstructions of the cervical spine were also generated. COMPARISON:  02/07/2015 head and cervical spine CT. FINDINGS: CT HEAD FINDINGS No evidence of parenchymal hemorrhage or extra-axial fluid collection. No mass lesion, mass effect, or midline shift. No CT evidence of  acute infarction. There is intracranial atherosclerosis. There is stable diffuse cerebral volume loss. There is stable encephalomalacia in the right temporal lobe with stable ex vacuo dilatation of the temporal horn of the right lateral ventricle. Stable lacunar infarct in the left thalamus. Nonspecific stable subcortical and periventricular white matter hypodensity, most in keeping with chronic small vessel ischemic change. No acute ventriculomegaly. Stable mucous retention cyst versus polyp in the posterior inferior right maxillary sinus, decreased in size since 12/20/2012. The visualized paranasal sinuses are otherwise essentially clear. The mastoid air cells are unopacified. No evidence of calvarial fracture. CT CERVICAL SPINE FINDINGS No fracture or acute subluxation is detected in the cervical spine. No prevertebral soft tissue swelling. There is straightening of the cervical spine, usually due to positioning and/or muscle spasm. Dens is well positioned between the lateral masses of C1. The lateral masses appear well-aligned. Marked degenerative disc disease in the mid to lower cervical spine, most prominent at C5-6. Marked bilateral facet arthropathy, asymmetric to the left. Stable degenerative 3 mm anterolisthesis at C4-5 and C5-6. Mild degenerative foraminal stenosis on the left at C3-4 and bilaterally at C4-5. Visualized mastoid air cells appear clear. No evidence of intra-axial hemorrhage in the visualized brain. No gross cervical canal hematoma. No acute  consolidative airspace disease or significant pulmonary nodules at the visualized lung apices. No appreciable cervical adenopathy or other cervical spine soft tissue abnormality. IMPRESSION: 1.  No evidence of acute intracranial abnormality. 2. Stable intracranial atherosclerosis, diffuse cerebral volume loss, right temporal lobe encephalomalacia, left colonic infarct and chronic small vessel ischemic white matter change. 3. No fracture or acute malalignment in the cervical spine. 4. Marked degenerative changes in the cervical spine as described. Electronically Signed   By: Ilona Sorrel M.D.   On: 04/06/2015 16:34   Ct Cervical Spine Wo Contrast  04/06/2015  CLINICAL DATA:  Fall.  Confusion. EXAM: CT HEAD WITHOUT CONTRAST CT CERVICAL SPINE WITHOUT CONTRAST TECHNIQUE: Multidetector CT imaging of the head and cervical spine was performed following the standard protocol without intravenous contrast. Multiplanar CT image reconstructions of the cervical spine were also generated. COMPARISON:  02/07/2015 head and cervical spine CT. FINDINGS: CT HEAD FINDINGS No evidence of parenchymal hemorrhage or extra-axial fluid collection. No mass lesion, mass effect, or midline shift. No CT evidence of acute infarction. There is intracranial atherosclerosis. There is stable diffuse cerebral volume loss. There is stable encephalomalacia in the right temporal lobe with stable ex vacuo dilatation of the temporal horn of the right lateral ventricle. Stable lacunar infarct in the left thalamus. Nonspecific stable subcortical and periventricular white matter hypodensity, most in keeping with chronic small vessel ischemic change. No acute ventriculomegaly. Stable mucous retention cyst versus polyp in the posterior inferior right maxillary sinus, decreased in size since 12/20/2012. The visualized paranasal sinuses are otherwise essentially clear. The mastoid air cells are unopacified. No evidence of calvarial fracture. CT CERVICAL SPINE  FINDINGS No fracture or acute subluxation is detected in the cervical spine. No prevertebral soft tissue swelling. There is straightening of the cervical spine, usually due to positioning and/or muscle spasm. Dens is well positioned between the lateral masses of C1. The lateral masses appear well-aligned. Marked degenerative disc disease in the mid to lower cervical spine, most prominent at C5-6. Marked bilateral facet arthropathy, asymmetric to the left. Stable degenerative 3 mm anterolisthesis at C4-5 and C5-6. Mild degenerative foraminal stenosis on the left at C3-4 and bilaterally at C4-5. Visualized mastoid air cells appear clear. No evidence of intra-axial  hemorrhage in the visualized brain. No gross cervical canal hematoma. No acute consolidative airspace disease or significant pulmonary nodules at the visualized lung apices. No appreciable cervical adenopathy or other cervical spine soft tissue abnormality. IMPRESSION: 1.  No evidence of acute intracranial abnormality. 2. Stable intracranial atherosclerosis, diffuse cerebral volume loss, right temporal lobe encephalomalacia, left colonic infarct and chronic small vessel ischemic white matter change. 3. No fracture or acute malalignment in the cervical spine. 4. Marked degenerative changes in the cervical spine as described. Electronically Signed   By: Ilona Sorrel M.D.   On: 04/06/2015 16:34    EKG: Independently reviewed.  Assessment/Plan Active Problems:   UTI (lower urinary tract infection)   Delirium   1. UTI causing delirium - failed levaquin 1. Empiric rocephin 2. Cultures pending (none obtained 2 days ago at time of diagnosis of UTI)    Code Status: Full Code  Family Communication: No family in room Disposition Plan: Admit to inpatient   Time spent: 30 min  Aubri Gathright M. [facility] Pager [phone]  If 7AM-7PM, please contact the day team taking care of the patient [email or web address] Password [email or web address] 04/06/2015, 7:20  PM

## 2015-04-06 NOTE — ED Provider Notes (Signed)
CSN: [id number]     Arrival date & time 04/06/15  1346 History   First MD Initiated Contact with Patient 04/06/15 1455     Chief Complaint  Patient presents with  . Fall     (Consider location/radiation/quality/duration/timing/severity/associated sxs/prior Treatment) HPI Comments: Started last night Confusion, "talking out of his head" Picking "rice" out of wife's hair all night long Michela Pitcher his son was living out in a field Emptied drawer into pillowcase Has happened before, not as severe Falling frequently starting again last night [place] today and didn't get up No LOC, head trauma No pain anywhere since the fall  (happened about 1 month ago)  Seems weak all over,  no appetite, will drink boost but won't eat for about 1-2 weeks Was diagnosed with UTI on Thursday, was started on levofloxacin, been taking abx Temp was 91 on Thursday at home and went to ED, however in ED was normal Catheter changed Wednesday  No fevers, no cough, no congestion        Patient is a 81 y.o. male presenting with fall.  Fall Pertinent negatives include no chest pain, no abdominal pain, no headaches and no shortness of breath.    Past Medical History  Diagnosis Date  . Seizures ([place])     Onset in Sept 2006  . Cataract   . Hard of hearing   . Renal disorder   . Depression   . HTN (hypertension)   . BPH (benign prostatic hyperplasia)   . CAD (coronary artery disease)     s/p CABG in 2002  . Hyperlipidemia   . OSA (obstructive sleep apnea)   . Aortic aneurysm ([place])     3 CM in 2007  . Carotid stenosis   . DDD (degenerative disc disease)   . Trigeminal neuralgia   . Lower GI bleed   . Stroke ([place])   . Allergy   . Aortic stenosis    Past Surgical History  Procedure Laterality Date  . Coronary artery bypass graft  2002  . Temporal artery biopsy / ligation  2004   Family History  Problem Relation Age of Onset  . CAD Brother   . Heart disease Brother   . Cancer Mother     Social History  Substance Use Topics  . Smoking status: Never Smoker   . Smokeless tobacco: Never Used  . Alcohol Use: No    Review of Systems  Constitutional: Positive for appetite change and fatigue. Negative for fever.  HENT: Negative for sore throat.   Eyes: Negative for visual disturbance.  Respiratory: Negative for shortness of breath.   Cardiovascular: Negative for chest pain.  Gastrointestinal: Negative for nausea, vomiting, abdominal pain, diarrhea and blood in stool.  Genitourinary: Negative for difficulty urinating.  Musculoskeletal: Negative for back pain and neck stiffness.  Skin: Negative for rash.  Neurological: Negative for dizziness, tremors, syncope, speech difficulty, weakness, numbness and headaches. Facial asymmetry: daughter thought  appeared less on left, similar symptoms to past stroke.  Psychiatric/Behavioral: Positive for confusion.      Allergies  Benadryl; Oxycodone; and Tylenol  Home Medications   Prior to Admission medications   Medication Sig Start Date End Date Taking? Authorizing Provider  aspirin EC 81 MG tablet Take 1 tablet (81 mg total) by mouth daily. 02/13/15  Yes Shanker Kristeen Mans, MD  finasteride (PROSCAR) 5 MG tablet Take 1 tablet (5 mg total) by mouth daily. 02/10/15  Yes Shanker Kristeen Mans, MD  lactose free nutrition (BOOST) LIQD Take  237 mLs by mouth daily.   Yes Historical Provider, MD  levETIRAcetam (KEPPRA) 500 MG tablet Take 1 tablet (500 mg total) by mouth every 12 (twelve) hours. 01/21/15  Yes Comer Locket, PA-C  metoprolol succinate (TOPROL-XL) 25 MG 24 hr tablet Take 1 tablet (25 mg total) by mouth daily. 02/10/15  Yes Shanker Kristeen Mans, MD  mirabegron ER (MYRBETRIQ) 25 MG TB24 tablet Take 25 mg by mouth daily.   Yes Historical Provider, MD  mirtazapine (REMERON) 7.5 MG tablet Take 7.5 mg by mouth at bedtime.   Yes Historical Provider, MD  tamsulosin (FLOMAX) 0.4 MG CAPS capsule Take 1 capsule (0.4 mg total) by mouth daily  after supper. 02/10/15  Yes Shanker Kristeen Mans, MD  traMADol (ULTRAM) 50 MG tablet Take 50 mg by mouth 3 (three) times daily as needed for moderate pain.   Yes Historical Provider, MD  triamcinolone cream (KENALOG) 0.1 % Apply 1 application topically 2 (two) times daily.   Yes Historical Provider, MD   BP 135/59 mmHg  Pulse 60  Temp(Src) 97 F (36.1 C) (Oral)  Resp 18  SpO2 79% Physical Exam  Constitutional: He appears well-developed and well-nourished. No distress.  HENT:  Head: Normocephalic and atraumatic.  Mouth/Throat: Oropharynx is clear and moist. No oropharyngeal exudate.  Eyes: Conjunctivae and EOM are normal. Pupils are equal, round, and reactive to light.  Neck: Normal range of motion.  Cardiovascular: Normal rate, regular rhythm, normal heart sounds and intact distal pulses.  Exam reveals no gallop and no friction rub.   No murmur heard. Pulmonary/Chest: Effort normal and breath sounds normal. No respiratory distress. He has no wheezes. He has no rales.  Abdominal: Soft. He exhibits no distension. There is no tenderness. There is no guarding.  Musculoskeletal: He exhibits no edema.  Neurological: He is alert. He has normal strength. No cranial nerve deficit or sensory deficit. Coordination normal. Abnormal gait: deferred given fall risk. GCS eye subscore is 4. GCS verbal subscore is 4. GCS motor subscore is 6.  Oriented to self/location Not oriented to date (however when asked about events happening in next week states halloween and is 11/6) No facial droop Initial concern for RUE weakness, however on repeat exam had no drift, no weakness  Skin: Skin is warm and dry. He is not diaphoretic.  Nursing note and vitals reviewed.   ED Course  Procedures (including critical care time) Labs Review Labs Reviewed  CBC WITH DIFFERENTIAL/PLATELET - Abnormal; Notable for the following:    RBC 3.97 (*)    Hemoglobin 12.6 (*)    HCT 37.6 (*)    All other components within normal  limits  COMPREHENSIVE METABOLIC PANEL - Abnormal; Notable for the following:    Glucose, Bld 103 (*)    Creatinine, Ser 1.35 (*)    Albumin 3.3 (*)    ALT 13 (*)    GFR calc non Af Amer 43 (*)    GFR calc Af Amer 49 (*)    All other components within normal limits  URINALYSIS, ROUTINE W REFLEX MICROSCOPIC (NOT AT [facility]) - Abnormal; Notable for the following:    APPearance HAZY (*)    Hgb urine dipstick LARGE (*)    Protein, ur 30 (*)    Leukocytes, UA MODERATE (*)    All other components within normal limits  URINE MICROSCOPIC-ADD ON - Abnormal; Notable for the following:    Bacteria, UA FEW (*)    All other components within normal limits  URINE CULTURE  CULTURE, BLOOD (ROUTINE X 2)  CULTURE, BLOOD (ROUTINE X 2)  MAGNESIUM  I-STAT CG4 LACTIC ACID, ED  [facility], ED  I-STAT CG4 LACTIC ACID, ED  [facility] Idol, ED  [facility] Idol, ED    Imaging Review Dg Chest 2 View  04/06/2015  CLINICAL DATA:  [place] today.  Confusion. EXAM: CHEST  2 VIEW COMPARISON:  04/03/2015 FINDINGS: The heart is enlarged but stable. Stable surgical changes from bypass surgery. Low lung volumes with vascular crowding and streaky basilar atelectasis. No definite infiltrates or effusions. The bony thorax is intact. Stable remote compression fracture of L1. IMPRESSION: Stable cardiac enlargement. Low lung volumes with vascular crowding and streaky bibasilar atelectasis. No definite infiltrates, effusions or edema. Electronically Signed   By: Marijo Sanes M.D.   On: 04/06/2015 16:09   Ct Head Wo Contrast  04/06/2015  CLINICAL DATA:  Fall.  Confusion. EXAM: CT HEAD WITHOUT CONTRAST CT CERVICAL SPINE WITHOUT CONTRAST TECHNIQUE: Multidetector CT imaging of the head and cervical spine was performed following the standard protocol without intravenous contrast. Multiplanar CT image reconstructions of the cervical spine were also generated. COMPARISON:  02/07/2015 head and cervical spine CT. FINDINGS: CT HEAD  FINDINGS No evidence of parenchymal hemorrhage or extra-axial fluid collection. No mass lesion, mass effect, or midline shift. No CT evidence of acute infarction. There is intracranial atherosclerosis. There is stable diffuse cerebral volume loss. There is stable encephalomalacia in the right temporal lobe with stable ex vacuo dilatation of the temporal horn of the right lateral ventricle. Stable lacunar infarct in the left thalamus. Nonspecific stable subcortical and periventricular white matter hypodensity, most in keeping with chronic small vessel ischemic change. No acute ventriculomegaly. Stable mucous retention cyst versus polyp in the posterior inferior right maxillary sinus, decreased in size since 12/20/2012. The visualized paranasal sinuses are otherwise essentially clear. The mastoid air cells are unopacified. No evidence of calvarial fracture. CT CERVICAL SPINE FINDINGS No fracture or acute subluxation is detected in the cervical spine. No prevertebral soft tissue swelling. There is straightening of the cervical spine, usually due to positioning and/or muscle spasm. Dens is well positioned between the lateral masses of C1. The lateral masses appear well-aligned. Marked degenerative disc disease in the mid to lower cervical spine, most prominent at C5-6. Marked bilateral facet arthropathy, asymmetric to the left. Stable degenerative 3 mm anterolisthesis at C4-5 and C5-6. Mild degenerative foraminal stenosis on the left at C3-4 and bilaterally at C4-5. Visualized mastoid air cells appear clear. No evidence of intra-axial hemorrhage in the visualized brain. No gross cervical canal hematoma. No acute consolidative airspace disease or significant pulmonary nodules at the visualized lung apices. No appreciable cervical adenopathy or other cervical spine soft tissue abnormality. IMPRESSION: 1.  No evidence of acute intracranial abnormality. 2. Stable intracranial atherosclerosis, diffuse cerebral volume loss,  right temporal lobe encephalomalacia, left colonic infarct and chronic small vessel ischemic white matter change. 3. No fracture or acute malalignment in the cervical spine. 4. Marked degenerative changes in the cervical spine as described. Electronically Signed   By: Ilona Sorrel M.D.   On: 04/06/2015 16:34   Ct Cervical Spine Wo Contrast  04/06/2015  CLINICAL DATA:  Fall.  Confusion. EXAM: CT HEAD WITHOUT CONTRAST CT CERVICAL SPINE WITHOUT CONTRAST TECHNIQUE: Multidetector CT imaging of the head and cervical spine was performed following the standard protocol without intravenous contrast. Multiplanar CT image reconstructions of the cervical spine were also generated. COMPARISON:  02/07/2015 head and cervical spine CT. FINDINGS: CT HEAD FINDINGS  No evidence of parenchymal hemorrhage or extra-axial fluid collection. No mass lesion, mass effect, or midline shift. No CT evidence of acute infarction. There is intracranial atherosclerosis. There is stable diffuse cerebral volume loss. There is stable encephalomalacia in the right temporal lobe with stable ex vacuo dilatation of the temporal horn of the right lateral ventricle. Stable lacunar infarct in the left thalamus. Nonspecific stable subcortical and periventricular white matter hypodensity, most in keeping with chronic small vessel ischemic change. No acute ventriculomegaly. Stable mucous retention cyst versus polyp in the posterior inferior right maxillary sinus, decreased in size since 12/20/2012. The visualized paranasal sinuses are otherwise essentially clear. The mastoid air cells are unopacified. No evidence of calvarial fracture. CT CERVICAL SPINE FINDINGS No fracture or acute subluxation is detected in the cervical spine. No prevertebral soft tissue swelling. There is straightening of the cervical spine, usually due to positioning and/or muscle spasm. Dens is well positioned between the lateral masses of C1. The lateral masses appear well-aligned.  Marked degenerative disc disease in the mid to lower cervical spine, most prominent at C5-6. Marked bilateral facet arthropathy, asymmetric to the left. Stable degenerative 3 mm anterolisthesis at C4-5 and C5-6. Mild degenerative foraminal stenosis on the left at C3-4 and bilaterally at C4-5. Visualized mastoid air cells appear clear. No evidence of intra-axial hemorrhage in the visualized brain. No gross cervical canal hematoma. No acute consolidative airspace disease or significant pulmonary nodules at the visualized lung apices. No appreciable cervical adenopathy or other cervical spine soft tissue abnormality. IMPRESSION: 1.  No evidence of acute intracranial abnormality. 2. Stable intracranial atherosclerosis, diffuse cerebral volume loss, right temporal lobe encephalomalacia, left colonic infarct and chronic small vessel ischemic white matter change. 3. No fracture or acute malalignment in the cervical spine. 4. Marked degenerative changes in the cervical spine as described. Electronically Signed   By: Ilona Sorrel M.D.   On: 04/06/2015 16:34   I have personally reviewed and evaluated these images and lab results as part of my medical decision-making.   EKG Interpretation None      MDM   Final diagnoses:  Confusion  Delirium    81 year old male with a history of hypertension, seizures, coronary artery disease, hyperlipidemia, AAA presents with concern of one day of confusion and frequent falls.  Patient has had similar episodes in the past, however had returned to baseline prior to symptoms again beginning last night. He was recently diagnosed with a urinary tract infection after home health had measured his temperature to be 91. He was seen in the emergency department with normal temperatures and was given antibiotics and sent home.  Neurologic exam is within normal limits and have low suspicion for acute CVA.  Daughter was concerned regarding possible facial droop in same location as had  been seen with prior stroke, however pt without facial droop now, and pt has not had dysarthria or aphasia, but rather has been confused, and has no other focal neurologic symptoms on history.  Urinalysis again concerning for UTI.  New foley catheter placed, culture sent and patient given rocephin.   AMS likely secondary to urinary infection.  Patient to be admitted to hospitalist for further care.   Gareth Morgan, MD 04/06/15 2320

## 2015-04-06 NOTE — ED Notes (Signed)
Bed: RESB Expected date: 04/06/15 Expected time: 1:44 PM Means of arrival:  Comments: AMS fall

## 2015-04-06 NOTE — ED Notes (Signed)
Pt. Reports going to commode, after which he was "too weak to get up; so I just slid to the floor".  His family, with whom he resides report pt. To possibly having some visual hallucinations past 1-2 days.  He arrives here awake, alert and oriented to all except day/date/time.  He is unhappy to have been transported to hospital.  His family also report pt. Was placed on antibiotic two days ago for u.t.i.

## 2015-04-06 NOTE — ED Notes (Signed)
Pr family feels that pt has facial droop and speech is slurred. Dr Billy Fischer at bedside addressing concerns

## 2015-04-06 NOTE — ED Notes (Signed)
Note:  He arrives with chronic foley attached to leg-bag which drains clear very dark amber urine.

## 2015-04-06 NOTE — ED Notes (Signed)
PT off the floor for CT. Will obtain blood when pt returns

## 2015-04-07 ENCOUNTER — Encounter (HOSPITAL_COMMUNITY): Payer: Self-pay

## 2015-04-07 DIAGNOSIS — W19XXXA Unspecified fall, initial encounter: Secondary | ICD-10-CM

## 2015-04-07 DIAGNOSIS — R627 Adult failure to thrive: Secondary | ICD-10-CM

## 2015-04-07 DIAGNOSIS — R339 Retention of urine, unspecified: Secondary | ICD-10-CM

## 2015-04-07 LAB — CBC
HCT: 35.6 % — ABNORMAL LOW (ref 39.0–52.0)
HEMOGLOBIN: 11.7 g/dL — AB (ref 13.0–17.0)
MCH: 31 pg (ref 26.0–34.0)
MCHC: 32.9 g/dL (ref 30.0–36.0)
MCV: 94.4 fL (ref 78.0–100.0)
PLATELETS: 191 10*3/uL (ref 150–400)
RBC: 3.77 MIL/uL — AB (ref 4.22–5.81)
RDW: 14 % (ref 11.5–15.5)
WBC: 6.1 10*3/uL (ref 4.0–10.5)

## 2015-04-07 LAB — BASIC METABOLIC PANEL
Anion gap: 7 (ref 5–15)
BUN: 18 mg/dL (ref 6–20)
CHLORIDE: 102 mmol/L (ref 101–111)
CO2: 28 mmol/L (ref 22–32)
CREATININE: 1.27 mg/dL — AB (ref 0.61–1.24)
Calcium: 9 mg/dL (ref 8.9–10.3)
GFR calc Af Amer: 53 mL/min — ABNORMAL LOW (ref >60)
GFR calc non Af Amer: 46 mL/min — ABNORMAL LOW (ref >60)
GLUCOSE: 128 mg/dL — AB (ref 65–99)
POTASSIUM: 3.9 mmol/L (ref 3.5–5.1)
Sodium: 137 mmol/L (ref 135–145)

## 2015-04-07 LAB — BLOOD GAS, ARTERIAL
Acid-Base Excess: 0.6 mmol/L (ref 0.0–2.0)
Bicarbonate: 25.5 mEq/L — ABNORMAL HIGH (ref 20.0–24.0)
Drawn by: 44898
O2 Content: 4 L/min
O2 SAT: 95.5 %
PCO2 ART: 44.4 mmHg (ref 35.0–45.0)
PO2 ART: 83.2 mmHg (ref 80.0–100.0)
Patient temperature: 98.6
TCO2: 23.1 mmol/L (ref 0–100)
pH, Arterial: 7.378 (ref 7.350–7.450)

## 2015-04-07 LAB — TROPONIN I: Troponin I: <0.03 ng/mL (ref <0.031)

## 2015-04-07 LAB — MRSA PCR SCREENING: MRSA BY PCR: NEGATIVE

## 2015-04-07 MED ORDER — METOPROLOL TARTRATE 1 MG/ML IV SOLN
2.5000 mg | Freq: Three times a day (TID) | INTRAVENOUS | Status: DC
  Administered 2015-04-07 – 2015-04-09 (×5): 2.5 mg via INTRAVENOUS
  Filled 2015-04-07 (×9): qty 5

## 2015-04-07 MED ORDER — SODIUM CHLORIDE 0.9 % IV SOLN
INTRAVENOUS | Status: DC
  Administered 2015-04-07 – 2015-04-08 (×2): via INTRAVENOUS

## 2015-04-07 MED ORDER — SODIUM CHLORIDE 0.9 % IV SOLN
500.0000 mg | Freq: Two times a day (BID) | INTRAVENOUS | Status: DC
  Administered 2015-04-07 – 2015-04-09 (×5): 500 mg via INTRAVENOUS
  Filled 2015-04-07 (×6): qty 5

## 2015-04-07 MED ORDER — LORAZEPAM 2 MG/ML IJ SOLN
2.0000 mg | Freq: Once | INTRAMUSCULAR | Status: AC
  Administered 2015-04-07: 2 mg via INTRAVENOUS
  Filled 2015-04-07: qty 1

## 2015-04-07 MED ORDER — LORAZEPAM 2 MG/ML IJ SOLN
1.0000 mg | Freq: Once | INTRAMUSCULAR | Status: AC
  Administered 2015-04-07: 1 mg via INTRAVENOUS
  Filled 2015-04-07: qty 1

## 2015-04-07 NOTE — Progress Notes (Signed)
PROGRESS NOTE  Larry Bradford WRU:[id number] DOB: July 06, 1916 DOA: 04/06/2015 PCP: Doristine Devoid, MD  HPI/Recap of past 24 hours:  Not waking up, snoring after ativan given last night, start ivf, keep npo change meds to iv  Assessment/Plan: Active Problems:   UTI (lower urinary tract infection)   Delirium  UTI: urine culture pending , continue rocephin for now.  Confusion: per common law wife [name], patient has showed gradual decline since last hospitalization with frequent falls and talking " out of his head" some times. 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 Symptomatic control.  Seizure; per martha patient has had about 5 seizures this year, last three weeks ago, reported he has been on keppra for two years, she did not like patient's neurologist, so patient's seizure has  Been managed by his PMD Dr. Deforest Hoyles.  change to iv due to no taking oral meds.  HTN; change to iv lopressor  OSA: may need cpap at night,  H/o ckd   H/o CAD, no chest pain, asa held due to not drowsiness  H/o moderate aortic stenosis: outpatient f/u with primary cardiologist  H/o urinary retention, has been requiring foley for the last three weeks, continue foley.  Code Status: full  Family Communication: patient's common law wife martha [phone], patient son Giuliano Preece [phone] (per [name], mr Mizer's children has not called since he was hospitalized last in september  Disposition Plan: martha desires patient to be discharged to snf    Consultants:  none  Procedures:  none  Antibiotics:  rocephin   Objective: BP 139/63 mmHg  Pulse 66  Temp(Src) 97.4 F (36.3 C) (Axillary)  Resp 17  Ht 5\' 10"  (1.778 m)  Wt 158 lb 11.7 oz (72 kg)  BMI 22.78 kg/m2  SpO2 76%  Intake/Output Summary (Last 24 hours) at 04/07/15 1156 Last data filed at 04/07/15 0930  Gross per 24 hour  Intake    360 ml  Output    300 ml  Net     60 ml   Filed Weights   04/06/15 2031  Weight: 158 lb 11.7 oz (72 kg)    Exam:   General:  Snoring, open eyes briefly with sternal rub  Cardiovascular: RRR  Respiratory: CTABL  Abdomen: Soft/ND/NT, positive BS  Musculoskeletal: No Edema  Neuro: not waking up, seems moving all extremity spontaneously  Data Reviewed: Basic Metabolic Panel:  Recent Labs Lab 04/03/15 1835 04/06/15 1643 04/07/15 0705  NA 138 137 137  K 3.7 4.3 3.9  CL 104 104 102  CO2 28 28 28   GLUCOSE 88 103* 128*  BUN 14 17 18   CREATININE 1.07 1.35* 1.27*  CALCIUM 8.7* 9.1 9.0  MG  --  2.0  --    Liver Function Tests:  Recent Labs Lab 04/06/15 1643  AST 19  ALT 13*  ALKPHOS 65  BILITOT 0.8  PROT 6.8  ALBUMIN 3.3*   No results for input(s): LIPASE, AMYLASE in the last 168 hours. No results for input(s): AMMONIA in the last 168 hours. CBC:  Recent Labs Lab 04/03/15 1835 04/06/15 1643  WBC 8.4 8.6  NEUTROABS 5.1 5.3  HGB 12.3* 12.6*  HCT 37.0* 37.6*  MCV 93.9 94.7  PLT 212 198   Cardiac Enzymes:    Recent Labs Lab 04/07/15 0705  TROPONINI <0.03   BNP (last 3 results)  Recent Labs  02/07/15 2154  BNP 48.2    ProBNP (last 3 results) No results  for input(s): PROBNP in the last 8760 hours.  CBG: No results for input(s): GLUCAP in the last 168 hours.  Recent Results (from the past 240 hour(s))  Urine culture     Status: None (Preliminary result)   Collection Time: 04/06/15  4:30 PM  Result Value Ref Range Status   Specimen Description URINE, CLEAN CATCH  Final   Special Requests NONE  Final   Culture   Final    TOO YOUNG TO READ Performed at [facility]    Report Status PENDING  Incomplete  Blood culture (routine x 2)     Status: None (Preliminary result)   Collection Time: 04/06/15  4:46 PM  Result Value Ref Range Status   Specimen Description BLOOD RIGHT ARM  Final   Special Requests BOTTLES DRAWN AEROBIC AND ANAEROBIC 5CC  Final   Culture   Final    NO GROWTH < 24  HOURS Performed at [facility]    Report Status PENDING  Incomplete  Blood culture (routine x 2)     Status: None (Preliminary result)   Collection Time: 04/06/15  4:46 PM  Result Value Ref Range Status   Specimen Description RIGHT ANTECUBITAL  Final   Special Requests BOTTLES DRAWN AEROBIC AND ANAEROBIC 5CC  Final   Culture   Final    NO GROWTH < 24 HOURS Performed at [facility]    Report Status PENDING  Incomplete     Studies: Dg Chest 2 View  04/06/2015  CLINICAL DATA:  [place] today.  Confusion. EXAM: CHEST  2 VIEW COMPARISON:  04/03/2015 FINDINGS: The heart is enlarged but stable. Stable surgical changes from bypass surgery. Low lung volumes with vascular crowding and streaky basilar atelectasis. No definite infiltrates or effusions. The bony thorax is intact. Stable remote compression fracture of L1. IMPRESSION: Stable cardiac enlargement. Low lung volumes with vascular crowding and streaky bibasilar atelectasis. No definite infiltrates, effusions or edema. Electronically Signed   By: Marijo Sanes M.D.   On: 04/06/2015 16:09   Ct Head Wo Contrast  04/06/2015  CLINICAL DATA:  Fall.  Confusion. EXAM: CT HEAD WITHOUT CONTRAST CT CERVICAL SPINE WITHOUT CONTRAST TECHNIQUE: Multidetector CT imaging of the head and cervical spine was performed following the standard protocol without intravenous contrast. Multiplanar CT image reconstructions of the cervical spine were also generated. COMPARISON:  02/07/2015 head and cervical spine CT. FINDINGS: CT HEAD FINDINGS No evidence of parenchymal hemorrhage or extra-axial fluid collection. No mass lesion, mass effect, or midline shift. No CT evidence of acute infarction. There is intracranial atherosclerosis. There is stable diffuse cerebral volume loss. There is stable encephalomalacia in the right temporal lobe with stable ex vacuo dilatation of the temporal horn of the right lateral ventricle. Stable lacunar infarct in the left thalamus.  Nonspecific stable subcortical and periventricular white matter hypodensity, most in keeping with chronic small vessel ischemic change. No acute ventriculomegaly. Stable mucous retention cyst versus polyp in the posterior inferior right maxillary sinus, decreased in size since 12/20/2012. The visualized paranasal sinuses are otherwise essentially clear. The mastoid air cells are unopacified. No evidence of calvarial fracture. CT CERVICAL SPINE FINDINGS No fracture or acute subluxation is detected in the cervical spine. No prevertebral soft tissue swelling. There is straightening of the cervical spine, usually due to positioning and/or muscle spasm. Dens is well positioned between the lateral masses of C1. The lateral masses appear well-aligned. Marked degenerative disc disease in the mid to lower cervical spine, most prominent at C5-6.  Marked bilateral facet arthropathy, asymmetric to the left. Stable degenerative 3 mm anterolisthesis at C4-5 and C5-6. Mild degenerative foraminal stenosis on the left at C3-4 and bilaterally at C4-5. Visualized mastoid air cells appear clear. No evidence of intra-axial hemorrhage in the visualized brain. No gross cervical canal hematoma. No acute consolidative airspace disease or significant pulmonary nodules at the visualized lung apices. No appreciable cervical adenopathy or other cervical spine soft tissue abnormality. IMPRESSION: 1.  No evidence of acute intracranial abnormality. 2. Stable intracranial atherosclerosis, diffuse cerebral volume loss, right temporal lobe encephalomalacia, left colonic infarct and chronic small vessel ischemic white matter change. 3. No fracture or acute malalignment in the cervical spine. 4. Marked degenerative changes in the cervical spine as described. Electronically Signed   By: Ilona Sorrel M.D.   On: 04/06/2015 16:34   Ct Cervical Spine Wo Contrast  04/06/2015  CLINICAL DATA:  Fall.  Confusion. EXAM: CT HEAD WITHOUT CONTRAST CT CERVICAL  SPINE WITHOUT CONTRAST TECHNIQUE: Multidetector CT imaging of the head and cervical spine was performed following the standard protocol without intravenous contrast. Multiplanar CT image reconstructions of the cervical spine were also generated. COMPARISON:  02/07/2015 head and cervical spine CT. FINDINGS: CT HEAD FINDINGS No evidence of parenchymal hemorrhage or extra-axial fluid collection. No mass lesion, mass effect, or midline shift. No CT evidence of acute infarction. There is intracranial atherosclerosis. There is stable diffuse cerebral volume loss. There is stable encephalomalacia in the right temporal lobe with stable ex vacuo dilatation of the temporal horn of the right lateral ventricle. Stable lacunar infarct in the left thalamus. Nonspecific stable subcortical and periventricular white matter hypodensity, most in keeping with chronic small vessel ischemic change. No acute ventriculomegaly. Stable mucous retention cyst versus polyp in the posterior inferior right maxillary sinus, decreased in size since 12/20/2012. The visualized paranasal sinuses are otherwise essentially clear. The mastoid air cells are unopacified. No evidence of calvarial fracture. CT CERVICAL SPINE FINDINGS No fracture or acute subluxation is detected in the cervical spine. No prevertebral soft tissue swelling. There is straightening of the cervical spine, usually due to positioning and/or muscle spasm. Dens is well positioned between the lateral masses of C1. The lateral masses appear well-aligned. Marked degenerative disc disease in the mid to lower cervical spine, most prominent at C5-6. Marked bilateral facet arthropathy, asymmetric to the left. Stable degenerative 3 mm anterolisthesis at C4-5 and C5-6. Mild degenerative foraminal stenosis on the left at C3-4 and bilaterally at C4-5. Visualized mastoid air cells appear clear. No evidence of intra-axial hemorrhage in the visualized brain. No gross cervical canal hematoma. No  acute consolidative airspace disease or significant pulmonary nodules at the visualized lung apices. No appreciable cervical adenopathy or other cervical spine soft tissue abnormality. IMPRESSION: 1.  No evidence of acute intracranial abnormality. 2. Stable intracranial atherosclerosis, diffuse cerebral volume loss, right temporal lobe encephalomalacia, left colonic infarct and chronic small vessel ischemic white matter change. 3. No fracture or acute malalignment in the cervical spine. 4. Marked degenerative changes in the cervical spine as described. Electronically Signed   By: Ilona Sorrel M.D.   On: 04/06/2015 16:34    Scheduled Meds: . cefTRIAXone (ROCEPHIN)  IV  1 g Intravenous Q24H  . heparin  5,000 Units Subcutaneous 3 times per day  . levETIRAcetam  500 mg Intravenous Q12H  . metoprolol  2.5 mg Intravenous 3 times per day  . triamcinolone cream  1 application Topical BID    Continuous Infusions: . sodium chloride  Time spent: 64mins  Theda Payer MD, PhD  [facility] Pager [phone]. If 7PM-7AM, please contact night-coverage at [email or web address], password [facility] 04/07/2015, 11:56 AM  LOS: 1 day

## 2015-04-07 NOTE — Progress Notes (Signed)
RT to bedside to perform ABG per MD order.  RN stated that she was unsure if he needed an ABG or a stick for lab.  RT will hold ABG at this time until order is clarified.  RT to monitor and assess as needed.

## 2015-04-08 ENCOUNTER — Inpatient Hospital Stay (HOSPITAL_COMMUNITY): Payer: Medicare Other

## 2015-04-08 DIAGNOSIS — N179 Acute kidney failure, unspecified: Secondary | ICD-10-CM

## 2015-04-08 LAB — BASIC METABOLIC PANEL
ANION GAP: 5 (ref 5–15)
BUN: 14 mg/dL (ref 6–20)
CALCIUM: 8.5 mg/dL — AB (ref 8.9–10.3)
CO2: 28 mmol/L (ref 22–32)
Chloride: 106 mmol/L (ref 101–111)
Creatinine, Ser: 1.1 mg/dL (ref 0.61–1.24)
GFR calc Af Amer: >60 mL/min (ref >60)
GFR, EST NON AFRICAN AMERICAN: 55 mL/min — AB (ref >60)
GLUCOSE: 85 mg/dL (ref 65–99)
POTASSIUM: 4 mmol/L (ref 3.5–5.1)
Sodium: 139 mmol/L (ref 135–145)

## 2015-04-08 MED ORDER — DEXTROSE-NACL 5-0.9 % IV SOLN
INTRAVENOUS | Status: DC
  Administered 2015-04-08 – 2015-04-09 (×2): via INTRAVENOUS

## 2015-04-08 NOTE — Clinical Social Work Note (Signed)
Clinical Social Work Assessment  Patient Details  Name: Larry Bradford MRN: [id number] Date of Birth: 09-22-1916  Date of referral:  04/08/15               Reason for consult:  Discharge Planning, Facility Placement                Permission sought to share information with:  Chartered certified accountant granted to share information::  Yes, Verbal Permission Granted  Name::        Agency::     Relationship::     Contact Information:     Housing/Transportation Living arrangements for the past 2 months:  Single Family Home Source of Information:  Other (Comment Required) (significant other Wellington Hampshire )) Patient Interpreter Needed:  None Criminal Activity/Legal Involvement Pertinent to Current Situation/Hospitalization:  No - Comment as needed Significant Relationships:  Adult Children, Significant Other Lives with:  Significant Other Do you feel safe going back to the place where you live?   (SNF placement needed.) Need for family participation in patient care:  Yes (Comment)  Care giving concerns:  Pt's care cannot be managed at home following hospital d/c.   Social Worker assessment / plan:  Pt hospitalized on 04/06/15 with an UTI and delirium. CSW consulted to assist with d/c planning. PN reviewed. CSW spoke with MD / significant other, Wellington Hampshire [phone]). 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 He is unable to participate in d/c planning. Ms.Hyde reports that she has been with pt for 30 yrs. Ms. Mathews Robinsons reports pt has adult children and [name] [phone] )is the decision maker for pt. Ms. Mathews Robinsons reports that she is unable to manage pt's care at home and feels placement is needed. Ms. Mathews Robinsons has referred CSW to pt's son for additional assistance with d/c planning. Message has been left for pt's son to contact CSW.   Employment status:  Retired Nurse, adult PT Recommendations:  Not assessed at this time [place] / Referral to  community resources:  [place]  Patient/Family's Response to care: Ms. Mathews Robinsons can no longer manage pt's care at home. She is hoping pt can be placed at SNF. Awaiting return call from son. She reports that since she is not legally married to pt she is unable to make decisions on his behalf. [place] does not recognize common law marriage regardless of the length of time a couple has resided together. PT has been ordered. If pt is unable to participate in PT [facility] will not assist with payment for SNF. Ms. Mathews Robinsons is aware of this and she reports pt may have pvt funds available for pvt pay placement.     Patient/Family's Understanding of and Emotional Response to Diagnosis, Current Treatment, and Prognosis:  Ms. Mathews Robinsons is aware of pt's medical status.  She is overwhelmed trying to care for pt at home.   Emotional Assessment Appearance:  Appears stated age Attitude/Demeanor/Rapport:  Unable to Assess Affect (typically observed):  Unable to Assess Orientation:  Oriented to Self Alcohol / Substance use:  Not Applicable Psych involvement (Current and /or in the community):  No (Comment)  Discharge Needs  Concerns to be addressed:  Discharge Planning Concerns Readmission within the last 30 days:  No Current discharge risk:  None Barriers to Discharge:  No Barriers Identified   Loraine Maple  [phone] 04/08/2015, 12:50 PM

## 2015-04-08 NOTE — Care Management Note (Signed)
Case Management Note  Patient Details  Name: Larry Bradford MRN: [id number] Date of Birth: [birth year]  Subjective/Objective:                  UTI (lower urinary tract infection)  Delirium Encephalopathy Action/Plan: Discharge planning Expected Discharge Date:   (UNKNOWN)               Expected Discharge Plan:  [place]  In-House Referral:     Discharge planning Services  CM Consult  Post Acute Care Choice:    Choice offered to:     DME Arranged:    DME Agency:     HH Arranged:    [place] Agency:     Status of Service:  Completed, signed off  Medicare Important Message Given:    Date Medicare IM Given:    Medicare IM give by:    Date Additional Medicare IM Given:    Additional Medicare Important Message give by:     If discussed at [place] of Stay Meetings, dates discussed:    Additional Comments: CM notes pt to go to SNF; CSW aware and arranging.  No other CM needs were communicated. Dellie Catholic, RN 04/08/2015, 2:42 PM

## 2015-04-08 NOTE — Progress Notes (Addendum)
PROGRESS NOTE  Larry Bradford HDQ:[id number] DOB: [birth year] DOA: 04/06/2015 PCP: Doristine Devoid, MD  HPI/Recap of past 24 hours:  Not waking up, snoring after ativan given the first night of admission,  on ivf,  Npo, meds changed to iv  Assessment/Plan: Active Problems:   UTI (lower urinary tract infection)   Delirium  Encephalopathy: per family patient has increase confusion recently, he was agitated the first night after being admitted, received ativan, he has been stuporous since, does maintain airway spontaneously, initial ct head on admission no acute finding, will repeat ct head today on 1//8 due to patient not waking up. In the process of contacting next of kin son Larry Bradford, had not been able to reach him, left message for him to call back. May need ng tube for nutrition if remain stuporous, need code status discussion.  UTI: urine culture pending , continue rocephin for now.  Confusion: per common law wife [name], patient has showed gradual decline since last hospitalization with frequent falls and talking " out of his head" some times. 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 Symptomatic control.  Seizure; per Larry Bradford patient has had about 5 seizures this year, last three weeks ago, reported he has been on keppra for two years, she did not like patient's neurologist, so patient's seizure has  Been managed by his PMD Dr. Deforest Hoyles.  change to iv due to no taking oral meds.  HTN; change to iv lopressor  OSA: may need cpap at night,  H/o ckd stage III, acute kidney injury on admission, cr normalized today on 11/8 is 1.1 ( 1.35 on admission)   H/o CAD, no chest pain, asa held due to not drowsiness  H/o moderate aortic stenosis: outpatient f/u with primary cardiologist  H/o urinary retention, has been requiring foley for the last three weeks, continue foley.  Code Status: full  Family Communication: patient's common law wife Larry Bradford [phone], patient  son Larry Bradford [phone] (per [name], mr Harbin's children has not called since he was hospitalized last in September Talked to Larry Bradford's daughter Larry Bradford on 11/8 to provide update, awaiting call back from Larry Bradford who is the HPOA  Disposition Plan: Larry Bradford desires patient to be discharged to snf    Consultants:  none  Procedures:  none  Antibiotics:  rocephin   Objective: BP 160/83 mmHg  Pulse 108  Temp(Src) 97.7 F (36.5 C) (Axillary)  Resp 18  Ht 5\' 10"  (1.778 m)  Wt 158 lb 11.7 oz (72 kg)  BMI 22.78 kg/m2  SpO2 98%  Intake/Output Summary (Last 24 hours) at 04/08/15 1231 Last data filed at 04/08/15 0914  Gross per 24 hour  Intake      0 ml  Output   1250 ml  Net  -1250 ml   Filed Weights   04/06/15 2031  Weight: 158 lb 11.7 oz (72 kg)    Exam:   General:  Snoring, open eyes briefly with sternal rub  Cardiovascular: RRR  Respiratory: CTABL  Abdomen: Soft/ND/NT, positive BS  Musculoskeletal: No Edema  Neuro: not waking up, seems moving all extremity spontaneously  Data Reviewed: Basic Metabolic Panel:  Recent Labs Lab 04/03/15 1835 04/06/15 1643 04/07/15 0705 04/08/15 0434  NA 138 137 137 139  K 3.7 4.3 3.9 4.0  CL 104 104 102 106  CO2 28 28 28 28   GLUCOSE 88 103* 128* 85  BUN 14 17 18 14   CREATININE 1.07 1.35* 1.27* 1.10  CALCIUM  8.7* 9.1 9.0 8.5*  MG  --  2.0  --   --    Liver Function Tests:  Recent Labs Lab 04/06/15 1643  AST 19  ALT 13*  ALKPHOS 65  BILITOT 0.8  PROT 6.8  ALBUMIN 3.3*   No results for input(s): LIPASE, AMYLASE in the last 168 hours. No results for input(s): AMMONIA in the last 168 hours. CBC:  Recent Labs Lab 04/03/15 1835 04/06/15 1643 04/07/15 2110  WBC 8.4 8.6 6.1  NEUTROABS 5.1 5.3  --   HGB 12.3* 12.6* 11.7*  HCT 37.0* 37.6* 35.6*  MCV 93.9 94.7 94.4  PLT 212 198 191   Cardiac Enzymes:    Recent Labs Lab 04/07/15 0705  TROPONINI <0.03   BNP (last 3 results)  Recent Labs   02/07/15 2154  BNP 48.2    ProBNP (last 3 results) No results for input(s): PROBNP in the last 8760 hours.  CBG: No results for input(s): GLUCAP in the last 168 hours.  Recent Results (from the past 240 hour(s))  Urine culture     Status: None (Preliminary result)   Collection Time: 04/06/15  4:30 PM  Result Value Ref Range Status   Specimen Description URINE, CLEAN CATCH  Final   Special Requests NONE  Final   Culture   Final    TOO YOUNG TO READ Performed at [facility]    Report Status PENDING  Incomplete  Blood culture (routine x 2)     Status: None (Preliminary result)   Collection Time: 04/06/15  4:46 PM  Result Value Ref Range Status   Specimen Description BLOOD RIGHT ARM  Final   Special Requests BOTTLES DRAWN AEROBIC AND ANAEROBIC 5CC  Final   Culture   Final    NO GROWTH < 24 HOURS Performed at [facility]    Report Status PENDING  Incomplete  Blood culture (routine x 2)     Status: None (Preliminary result)   Collection Time: 04/06/15  4:46 PM  Result Value Ref Range Status   Specimen Description RIGHT ANTECUBITAL  Final   Special Requests BOTTLES DRAWN AEROBIC AND ANAEROBIC 5CC  Final   Culture   Final    NO GROWTH < 24 HOURS Performed at [facility]    Report Status PENDING  Incomplete  MRSA PCR Screening     Status: None   Collection Time: 04/07/15  7:05 PM  Result Value Ref Range Status   MRSA by PCR NEGATIVE NEGATIVE Final    Comment:        The GeneXpert MRSA Assay (FDA approved for NASAL specimens only), is one component of a comprehensive MRSA colonization surveillance program. It is not intended to diagnose MRSA infection nor to guide or monitor treatment for MRSA infections.      Studies: No results found.  Scheduled Meds: . cefTRIAXone (ROCEPHIN)  IV  1 g Intravenous Q24H  . heparin  5,000 Units Subcutaneous 3 times per day  . levETIRAcetam  500 mg Intravenous Q12H  . metoprolol  2.5 mg Intravenous 3 times  per day  . triamcinolone cream  1 application Topical BID    Continuous Infusions: . dextrose 5 % and 0.9% NaCl 50 mL/hr at 04/08/15 0739     Time spent: 51mins  Witney Huie MD, PhD  [facility] Pager [phone]. If 7PM-7AM, please contact night-coverage at [email or web address], password [email or web address] 04/08/2015, 12:31 PM  LOS: 2 days

## 2015-04-09 DIAGNOSIS — I2510 Atherosclerotic heart disease of native coronary artery without angina pectoris: Secondary | ICD-10-CM

## 2015-04-09 DIAGNOSIS — G9340 Encephalopathy, unspecified: Secondary | ICD-10-CM

## 2015-04-09 DIAGNOSIS — N390 Urinary tract infection, site not specified: Secondary | ICD-10-CM

## 2015-04-09 DIAGNOSIS — N400 Enlarged prostate without lower urinary tract symptoms: Secondary | ICD-10-CM

## 2015-04-09 DIAGNOSIS — I10 Essential (primary) hypertension: Secondary | ICD-10-CM

## 2015-04-09 DIAGNOSIS — I359 Nonrheumatic aortic valve disorder, unspecified: Secondary | ICD-10-CM

## 2015-04-09 DIAGNOSIS — R569 Unspecified convulsions: Secondary | ICD-10-CM

## 2015-04-09 LAB — VITAMIN B12: Vitamin B-12: 336 pg/mL (ref 180–914)

## 2015-04-09 LAB — AMMONIA: AMMONIA: 32 umol/L (ref 9–35)

## 2015-04-09 LAB — URINE CULTURE: Culture: >=100000

## 2015-04-09 LAB — RPR: RPR: NONREACTIVE

## 2015-04-09 LAB — TSH: TSH: 4.365 u[IU]/mL (ref 0.350–4.500)

## 2015-04-09 LAB — HIV ANTIBODY (ROUTINE TESTING W REFLEX): HIV SCREEN 4TH GENERATION: NONREACTIVE

## 2015-04-09 MED ORDER — METOPROLOL TARTRATE 1 MG/ML IV SOLN
2.5000 mg | Freq: Two times a day (BID) | INTRAVENOUS | Status: DC
Start: 2015-04-09 — End: 2015-04-09
  Filled 2015-04-09: qty 5

## 2015-04-09 MED ORDER — ENSURE ENLIVE PO LIQD
237.0000 mL | Freq: Two times a day (BID) | ORAL | Status: DC
  Administered 2015-04-09 – 2015-04-12 (×5): 237 mL via ORAL

## 2015-04-09 MED ORDER — FINASTERIDE 5 MG PO TABS
5.0000 mg | ORAL_TABLET | Freq: Every day | ORAL | Status: DC
  Administered 2015-04-09 – 2015-04-12 (×4): 5 mg via ORAL
  Filled 2015-04-09 (×4): qty 1

## 2015-04-09 MED ORDER — LEVETIRACETAM 500 MG PO TABS
500.0000 mg | ORAL_TABLET | Freq: Two times a day (BID) | ORAL | Status: DC
  Administered 2015-04-09 – 2015-04-12 (×6): 500 mg via ORAL
  Filled 2015-04-09 (×8): qty 1

## 2015-04-09 MED ORDER — TAMSULOSIN HCL 0.4 MG PO CAPS
0.4000 mg | ORAL_CAPSULE | Freq: Every day | ORAL | Status: DC
  Administered 2015-04-09 – 2015-04-11 (×3): 0.4 mg via ORAL
  Filled 2015-04-09 (×4): qty 1

## 2015-04-09 MED ORDER — ASPIRIN EC 81 MG PO TBEC
81.0000 mg | DELAYED_RELEASE_TABLET | Freq: Every day | ORAL | Status: DC
  Administered 2015-04-09 – 2015-04-12 (×4): 81 mg via ORAL
  Filled 2015-04-09 (×4): qty 1

## 2015-04-09 NOTE — Progress Notes (Signed)
CSW assisting with d/c planning. Pt's son, [name], has contacted CSW. Update provided. Son is comfortable with CSW working with pt's significant other [name] ) regarding d/c planning. SNF search has been initiated and [name] has requested [facility] ( [facility] ) for [facility]. [facility] contacted and a decision is pending. Pt was recently d/c from [place] ( 10/24 ). [name] is aware pt will have co payments beginning on day 1 of SNF placement. CSW will assist with [facility] authorization. Pt will need to be 24 hr. sitter free prior to d/c to SNF. MD contacted and updated.   Werner Lean LCSW [phone]

## 2015-04-09 NOTE — Progress Notes (Signed)
Initial Nutrition Assessment  DOCUMENTATION CODES:   Not applicable  INTERVENTION:  - RD team will continue to monitor for needs. - When diet is advanced, please provide Ensure Enlive BID (each supplement contains 350 kcal and 20 gm protein).     NUTRITION DIAGNOSIS:   Inadequate oral intake related to inability to eat as evidenced by NPO status.   GOAL:   Patient will meet greater than or equal to 90% of their needs    MONITOR:   PO intake, Supplement acceptance, Diet advancement, Labs, Weight trends, Skin, I & O's  REASON FOR ASSESSMENT:   Low Braden    ASSESSMENT:   81 y.o. male who is brought in by family with delirium. Patient was diagnosed with UTI 2 days ago, started on levaquin. Since then he has become increasingly delirious and disoriented today.  Patient is sitting upright in chair with sitter in the room during low Braden assessment. During admission, patient has had history of increasing dementia. Patient is very hard of hearing, and had trouble collecting historical data.  Per nurse, patient's alertness waxes and wanes. He is currently NPO awaiting a swallow evaluation for diet advancement. Patient states he is hungry. He describes that his appetite was ok prior to admission.   Patient states he usually eats foods including: hamburgers, and corn. He describes that he likes Ensure and Boost supplements that he has tried previously. It is unclear if he is taking them currently.   Patient's weight is 158 lbs, which represents a weight change of 10 lbs since late august (6% / ~ 2 months) which is border-line significant. Discharge planning is in process to a SNF.   Recommend provision of nutrition supplements once diet is advanced. Not able to perform NFPE at this time, will perform at follow-up.   Labs reviewed: low Ca 8.5, high Cr 1.27, low Heme 11.7.   Medications reviewed.    Diet Order:  Diet NPO time specified  Skin:  Wound (see comment)  (wound/incision left elbow)  Last BM:  11/4  Height:   Ht Readings from Last 1 Encounters:  04/06/15 5\' 10"  (1.778 m)    Weight:   Wt Readings from Last 1 Encounters:  04/06/15 158 lb 11.7 oz (72 kg)    Ideal Body Weight:  75.5 kg  BMI:  Body mass index is 22.78 kg/(m^2).  Estimated Nutritional Needs:   Kcal:  1800-2000 kcal  Protein:  85-95 gm  Fluid:  1.8-2.0 L/day  EDUCATION NEEDS:   No education needs identified at this time  Kayleen Memos, Dietetic Intern 04/09/2015 12:29 PM

## 2015-04-09 NOTE — Clinical Social Work Placement (Signed)
   CLINICAL SOCIAL WORK PLACEMENT  NOTE  Date:  04/09/2015  Patient Details  Name: [name] MRN: [id number] Date of Birth: [birth year]  Clinical Social Work is seeking post-discharge placement for this patient at the [place] level of care (*CSW will initial, date and re-position this form in  chart as items are completed):  Yes   Patient/family provided with [place] Work Department's list of facilities offering this level of care within the geographic area requested by the patient (or if unable, by the patient's family).  Yes   Patient/family informed of their freedom to choose among providers that offer the needed level of care, that participate in Medicare, Medicaid or managed care program needed by the patient, have an available bed and are willing to accept the patient.  Yes   Patient/family informed of [place]'s ownership interest in [facility] and [facility], as well as of the fact that they are under no obligation to receive care at these facilities.  PASRR submitted to EDS on 04/09/15     PASRR number received on       Existing PASRR number confirmed on       FL2 transmitted to all facilities in geographic area requested by pt/family on 04/09/15     FL2 transmitted to all facilities within larger geographic area on       Patient informed that his/her managed care company has contracts with or will negotiate with certain facilities, including the following:            Patient/family informed of bed offers received.  Patient chooses bed at       Physician recommends and patient chooses bed at      Patient to be transferred to   on  .  Patient to be transferred to facility by       Patient family notified on   of transfer.  Name of family member notified:        PHYSICIAN       Additional Comment:    _______________________________________________ Luretha Rued, [place] 04/09/2015, 2:03  PM

## 2015-04-09 NOTE — Progress Notes (Signed)
[facility] Progress Note    Patient: Larry Bradford  YHC:[id number]  DOB: June 21, 1916  DOA: 04/06/2015 Date of Service: the patient was seen and examined on 04/09/2015  Subjective: Assessment is the patient mental status, although the patient denied any complaint. Nutrition: Has been able to tolerate oral diet throughout the day Activity: Out of bed to chair Last BM: Today  Brief Summary of Hospitalization: Day 3 of admission Patient initially presented with confusion and was started treatment on ceftriaxone for UTI. Has received Ativan and has been stuporous since. Currently mentation has improved significantly. Patient has had a bedside sitter until today.  Assessment and Plan 1. UTI (lower urinary tract infection) Continuing IV ceftriaxone. Urine is showing coagulase-negative staph.  2.acute encephalopathy. Multifactorial. Patient does have history of seizure disorders, CT scan 2 is negative for any acute abnormality. Speech therapy has cleared the patient for regular diet On evaluation the patient does not have any focal deficit. Throughout the patient has improved significantly and has remained awake. Also has precipitated and physical therapy as well as had 100% of his diet. Most likely medication induced. Continue close monitoring and avoid psychotropic medication unless emergency.  3 Hypertension Resume home blood pressure orally today.  4 Seizures (HCC) Continuing Keppra and oral format.  5  BPH (benign prostatic hyperplasia) Resuming home medications. Continue Foley catheter, since the patient presented with this from home.  DVT Prophylaxis: subcutaneous Heparin Nutrition: Regular diet advance as tolerated Advance goals of care discussion: Full code  Disposition:  Discharge in 1-2 days, to SNF.  Consultants: None Procedures: None Antibiotics: Ceftriaxone 04/06/2015 start date   Intake/Output Summary (Last 24 hours) at 04/09/15 1625 Last data  filed at 04/09/15 1300  Gross per 24 hour  Intake 1270.83 ml  Output    825 ml  Net 445.83 ml   Filed Weights   04/06/15 2031  Weight: 72 kg (158 lb 11.7 oz)    Objective: Physical Exam: Filed Vitals:   04/08/15 0545 04/08/15 1341 04/08/15 2105 04/09/15 0508  BP: 160/83 153/91 116/53 122/46  Pulse: 108 101 55 56  Temp: 97.7 F (36.5 C) 97.5 F (36.4 C) 98.3 F (36.8 C) 98.7 F (37.1 C)  TempSrc: Axillary Axillary Oral Oral  Resp: 18 18 20 18   Height:      Weight:      SpO2: 98% 99% 99% 99%     General: Appear in mild distress Eyes: PERRL ENT: Oral Mucosa moist. Neck: no JVD Cardiovascular: S1 and S2 Present, aortic systolic Murmur, Peripheral Pulses present Respiratory: Bilateral Air entry present and Clear to Auscultation, no Crackles, no wheezes Abdomen: Bowel Sound present, Soft and no tenderness Skin: no Rash Extremities: no Pedal edema, no calf tenderness Neurologic: Patient not following command but is wondering is doing well extremities. Withdraws to painful stimuli. Reflexes present bilaterally.  Data Reviewed: CBC:  Recent Labs Lab 04/03/15 1835 04/06/15 1643 04/07/15 2110  WBC 8.4 8.6 6.1  NEUTROABS 5.1 5.3  --   HGB 12.3* 12.6* 11.7*  HCT 37.0* 37.6* 35.6*  MCV 93.9 94.7 94.4  PLT 212 198 517   Basic Metabolic Panel:  Recent Labs Lab 04/03/15 1835 04/06/15 1643 04/07/15 0705 04/08/15 0434  NA 138 137 137 139  K 3.7 4.3 3.9 4.0  CL 104 104 102 106  CO2 28 28 28 28   GLUCOSE 88 103* 128* 85  BUN 14 17 18 14   CREATININE 1.07 1.35* 1.27* 1.10  CALCIUM 8.7* 9.1 9.0 8.5*  MG  --  2.0  --   --    Liver Function Tests:  Recent Labs Lab 04/06/15 1643  AST 19  ALT 13*  ALKPHOS 65  BILITOT 0.8  PROT 6.8  ALBUMIN 3.3*   No results for input(s): LIPASE, AMYLASE in the last 168 hours.  Recent Labs Lab 04/09/15 0410  AMMONIA 32    Cardiac Enzymes:  Recent Labs Lab 04/07/15 0705  TROPONINI <0.03   BNP (last 3  results)  Recent Labs  02/07/15 2154  BNP 48.2    ProBNP (last 3 results) No results for input(s): PROBNP in the last 8760 hours.  CBG: No results for input(s): GLUCAP in the last 168 hours.  Recent Results (from the past 240 hour(s))  Urine culture     Status: None   Collection Time: 04/06/15  4:30 PM  Result Value Ref Range Status   Specimen Description URINE, CLEAN CATCH  Final   Special Requests NONE  Final   Culture   Final    >=100,000 COLONIES/mL STAPHYLOCOCCUS SPECIES (COAGULASE NEGATIVE) Performed at [facility]    Report Status 04/09/2015 FINAL  Final   Organism ID, Bacteria STAPHYLOCOCCUS SPECIES (COAGULASE NEGATIVE)  Final      Susceptibility   Staphylococcus species (coagulase negative) - MIC*    CIPROFLOXACIN >=8 RESISTANT Resistant     GENTAMICIN <=0.5 SENSITIVE Sensitive     NITROFURANTOIN <=16 SENSITIVE Sensitive     OXACILLIN <=0.25 SENSITIVE Sensitive     TETRACYCLINE >=16 RESISTANT Resistant     VANCOMYCIN <=0.5 SENSITIVE Sensitive     TRIMETH/SULFA <=10 SENSITIVE Sensitive     CLINDAMYCIN <=0.25 SENSITIVE Sensitive     RIFAMPIN <=0.5 SENSITIVE Sensitive     Inducible Clindamycin NEGATIVE Sensitive     * >=100,000 COLONIES/mL STAPHYLOCOCCUS SPECIES (COAGULASE NEGATIVE)  Blood culture (routine x 2)     Status: None (Preliminary result)   Collection Time: 04/06/15  4:46 PM  Result Value Ref Range Status   Specimen Description BLOOD RIGHT ARM  Final   Special Requests BOTTLES DRAWN AEROBIC AND ANAEROBIC 5CC  Final   Culture   Final    NO GROWTH 3 DAYS Performed at [facility]    Report Status PENDING  Incomplete  Blood culture (routine x 2)     Status: None (Preliminary result)   Collection Time: 04/06/15  4:46 PM  Result Value Ref Range Status   Specimen Description RIGHT ANTECUBITAL  Final   Special Requests BOTTLES DRAWN AEROBIC AND ANAEROBIC 5CC  Final   Culture   Final    NO GROWTH 3 DAYS Performed at [facility]     Report Status PENDING  Incomplete  MRSA PCR Screening     Status: None   Collection Time: 04/07/15  7:05 PM  Result Value Ref Range Status   MRSA by PCR NEGATIVE NEGATIVE Final    Comment:        The GeneXpert MRSA Assay (FDA approved for NASAL specimens only), is one component of a comprehensive MRSA colonization surveillance program. It is not intended to diagnose MRSA infection nor to guide or monitor treatment for MRSA infections.      Studies: Ct Head Wo Contrast  04/08/2015  CLINICAL DATA:  Acute onset confusion and altered mental status EXAM: CT HEAD WITHOUT CONTRAST TECHNIQUE: Contiguous axial images were obtained from the base of the skull through the vertex without intravenous contrast. COMPARISON:  April 06, 2015 FINDINGS: There is mild generalized atrophy 3 there is no intracranial  mass, hemorrhage, extra-axial fluid collection, or midline shift. Patchy small vessel disease in the centra semiovale bilaterally is stable. There is a prior lacunar infarct in the left thalamus, stable. There is a prior small lacunar infarct at the genu of the right internal capsule, stable. No acute infarct evident. Bony calvarium appears intact. The mastoid air cells are clear. There is a stable retention cyst in right maxillary antrum. IMPRESSION: Stable atrophy with periventricular small vessel disease. Prior small infarcts in the left thalamus and right internal capsule are stable. No acute infarct evident. No mass or hemorrhage. Stable retention cyst in right maxillary antrum. Electronically Signed   By: Lowella Grip III M.D.   On: 04/08/2015 15:13    Scheduled Meds: . aspirin EC  81 mg Oral Daily  . cefTRIAXone (ROCEPHIN)  IV  1 g Intravenous Q24H  . feeding supplement (ENSURE ENLIVE)  237 mL Oral BID BM  . finasteride  5 mg Oral Daily  . heparin  5,000 Units Subcutaneous 3 times per day  . levETIRAcetam  500 mg Oral BID  . tamsulosin  0.4 mg Oral QPC supper  . triamcinolone  cream  1 application Topical BID   Continuous Infusions:   Time spent: 25 minutes  Author: Berle Mull, MD [facility] Pager: [phone] 04/09/2015 4:25 PM  If 7PM-7AM, please contact night-coverage at [email or web address], password [facility]

## 2015-04-09 NOTE — Evaluation (Signed)
Clinical/Bedside Swallow Evaluation Patient Details  Name: Larry Bradford MRN: [id number] Date of Birth: 1917/03/01  Today's Date: 04/09/2015 Time:        Past Medical History:  Past Medical History  Diagnosis Date  . Seizures ([place])     Onset in Sept 2006  . Cataract   . Hard of hearing   . Renal disorder   . Depression   . HTN (hypertension)   . BPH (benign prostatic hyperplasia)   . CAD (coronary artery disease)     s/p CABG in 2002  . Hyperlipidemia   . OSA (obstructive sleep apnea)   . Aortic aneurysm ([place])     3 CM in 2007  . Carotid stenosis   . DDD (degenerative disc disease)   . Trigeminal neuralgia   . Lower GI bleed   . Stroke ([place])   . Allergy   . Aortic stenosis    Past Surgical History:  Past Surgical History  Procedure Laterality Date  . Coronary artery bypass graft  2002  . Temporal artery biopsy / ligation  2004   HPI:  81 y.o. male adm with delirium, UTI; PMH: HTN, CABG, Szs, OSA, hx multiple CVAs.  Poor responsiveness since admission.    Assessment / Plan / Recommendation Clinical Impression  Pt presents with much improved MS and resulting improved swallow - mastication is normal; swallow response is brisk; there are no s/s of aspiration.  Recommend resuming a regular diet, thin liquids.  No SLP f/u.     Aspiration Risk  Mild    Diet Recommendation Age appropriate regular solids;Thin   Medication Administration: Whole meds with puree    Other  Recommendations Oral Care Recommendations: Oral care BID   Swallow Study Prior Functional Status       General Date of Onset: 04/08/15 Other Pertinent Information: 81 y.o. male adm with delirium, UTI; PMH: HTN, CABG, Szs, OSA, hx multiple CVAs.  Poor responsiveness since admission.  Type of Study: Bedside swallow evaluation Previous Swallow Assessment: none Diet Prior to this Study: NPO Temperature Spikes Noted: No Respiratory Status: Supplemental O2 delivered via (comment) History of  Recent Intubation: No Behavior/Cognition: Alert;Cooperative Oral Cavity - Dentition: Adequate natural dentition/normal for age Self-Feeding Abilities: Able to feed self Patient Positioning: Upright in bed Baseline Vocal Quality: Normal Volitional Swallow: Able to elicit    Oral/Motor/Sensory Function Overall Oral Motor/Sensory Function: Appears within functional limits for tasks assessed   Ice Chips Ice chips: Within functional limits Presentation: Self Fed;Spoon   Thin Liquid Thin Liquid: Within functional limits Presentation: Cup;Straw    Nectar Thick Nectar Thick Liquid: Not tested   Honey Thick Honey Thick Liquid: Not tested   Puree Puree: Within functional limits Presentation: Self Fed;Spoon   Solid  Larry Bradford L. [place], [place] CCC/SLP Pager [phone]     Solid: Within functional limits       Larry Bradford 04/09/2015,1:01 PM

## 2015-04-09 NOTE — Care Management Important Message (Signed)
Important Message  Patient Details  Name: EDIN KON MRN: [id number] Date of Birth: 15-Jun-1916   Medicare Important Message Given:  Yes-second notification given    Camillo Flaming 04/09/2015, 1:26 Helper Message  Patient Details  Name: JAREK LONGTON MRN: [id number] Date of Birth: Aug 10, 1916   Medicare Important Message Given:  Yes-second notification given    Camillo Flaming 04/09/2015, 1:26 PM

## 2015-04-09 NOTE — NC FL2 (Signed)
[place] LEVEL OF CARE SCREENING TOOL     IDENTIFICATION  Patient Name: Larry Bradford Birthdate: 07-12-1916 Sex: male Admission Date (Current Location): 04/06/2015  [facility] and [place] Number:     Facility and Address:  [facility],  [place] [address], [place]      Provider Number: [id number]  Attending Physician Name and Address:  Lavina Hamman, MD  Relative Name and Phone Number:       Current Level of Care: Hospital Recommended Level of Care: [place] Prior Approval Number:    Date Approved/Denied:   PASRR Number:    Discharge Plan: SNF    Current Diagnoses: Patient Active Problem List   Diagnosis Date Noted  . UTI (lower urinary tract infection) 04/06/2015  . Delirium 04/06/2015  . Atrial tachycardia ([place])   . Coronary artery disease involving coronary bypass graft of native heart without angina pectoris   . Altered mental status 02/08/2015  . Tachycardia 02/07/2015  . Rash 02/07/2015  . Fall 02/07/2015  . Acute encephalopathy 02/07/2015  . History of stroke 04/19/2013  . Conjunctivitis, bacterial 01/09/2013  . SOB (shortness of breath) 01/07/2013  . Aortic valve disorder 01/07/2013  . Hyperkalemia 12/20/2012  . Seizures ([place])   . Cataract   . Hard of hearing   . CKD (chronic kidney disease), stage III   . Depression   . BPH (benign prostatic hyperplasia)   . CAD (coronary artery disease)   . Hyperlipidemia   . Carotid stenosis   . Hypertension 06/18/2011  . Weakness 06/18/2011    Orientation ACTIVITIES/SOCIAL BLADDER RESPIRATION    Self  Passive Indwelling catheter Normal  BEHAVIORAL SYMPTOMS/MOOD NEUROLOGICAL BOWEL NUTRITION STATUS   (no behaviors) Convulsions/Seizures Continent  (NPO- SLP consult pending)  PHYSICIAN VISITS COMMUNICATION OF NEEDS Height & Weight Skin    Verbally 5\' 10"  (177.8 cm) 158 lbs. Skin abrasions          AMBULATORY STATUS RESPIRATION    Assist extensive  Normal      Personal Care Assistance Level of Assistance               Functional Limitations Info  Hearing   Hearing Info: Impaired         SPECIAL CARE FACTORS FREQUENCY  PT (By licensed PT)     PT Frequency: 6-7 x wkly             Additional Factors Info  Code Status Code Status Info: Full Code             Current Medications (04/09/2015): Current Facility-Administered Medications  Medication Dose Route Frequency Provider Last Rate Last Dose  . cefTRIAXone (ROCEPHIN) 1 g in dextrose 5 % 50 mL IVPB  1 g Intravenous Q24H Etta Quill, DO   1 g at 04/08/15 1734  . dextrose 5 %-0.9 % sodium chloride infusion   Intravenous Continuous Florencia Reasons, MD 50 mL/hr at 04/09/15 0606    . heparin injection 5,000 Units  5,000 Units Subcutaneous 3 times per day Etta Quill, DO   5,000 Units at 04/09/15 0603  . levETIRAcetam (KEPPRA) 500 mg in sodium chloride 0.9 % 100 mL IVPB  500 mg Intravenous Q12H Florencia Reasons, MD   500 mg at 04/09/15 1238  . metoprolol (LOPRESSOR) injection 2.5 mg  2.5 mg Intravenous Q12H Lavina Hamman, MD      . triamcinolone cream (KENALOG) 0.1 % 1 application  1 application Topical BID [facility]  Alcario Drought, DO   1 application at 57/26/20 1131   Do not use this list as official medication orders. Please verify with discharge summary.  Discharge Medications:   Medication List    ASK your doctor about these medications        aspirin EC 81 MG tablet  Take 1 tablet (81 mg total) by mouth daily.     finasteride 5 MG tablet  Commonly known as:  PROSCAR  Take 1 tablet (5 mg total) by mouth daily.     lactose free nutrition Liqd  Take 237 mLs by mouth daily.     levETIRAcetam 500 MG tablet  Commonly known as:  KEPPRA  Take 1 tablet (500 mg total) by mouth every 12 (twelve) hours.     metoprolol succinate 25 MG 24 hr tablet  Commonly known as:  TOPROL-XL  Take 1 tablet (25 mg total) by mouth daily.     mirtazapine 7.5 MG tablet  Commonly known as:   REMERON  Take 7.5 mg by mouth at bedtime.     MYRBETRIQ 25 MG Tb24 tablet  Generic drug:  mirabegron ER  Take 25 mg by mouth daily.     tamsulosin 0.4 MG Caps capsule  Commonly known as:  FLOMAX  Take 1 capsule (0.4 mg total) by mouth daily after supper.     traMADol 50 MG tablet  Commonly known as:  ULTRAM  Take 50 mg by mouth 3 (three) times daily as needed for moderate pain.     triamcinolone cream 0.1 %  Commonly known as:  KENALOG  Apply 1 application topically 2 (two) times daily.        Relevant Imaging Results:  Relevant Lab Results:  Recent Labs    Additional Information SS # [id number] ; Safety sitter needed due to delirium caused by UTI. Pt will be sitter free 24 hrs. prior to d/c.  Isabel Ardila, Randall An, LCSW

## 2015-04-09 NOTE — Evaluation (Signed)
Physical Therapy Evaluation Patient Details Name: Larry Bradford MRN: [id number] DOB: 02-Feb-1917 Today's Date: 04/09/2015   History of Present Illness  Larry Bradford is a 81 y.o. male adm with delirium, UTI; PMH: HTN, CABG,  Szs, OSA  Clinical Impression  Pt admitted with above diagnosis. Pt currently with functional limitations due to the deficits listed below (see PT Problem List).  Pt will benefit from skilled PT to increase their independence and safety with mobility to allow discharge to the venue listed below.   Pt extremely lethargic but after sitting EOB for ~5min he became more alert and was able to participate in bed<>chair transfer with min/mod assist and second person for safety; Pt has sitter with him; no family present at time of eval so difficult to determine baseline, recommend SNF; will follow     Follow Up Recommendations SNF;Supervision/Assistance - 24 hour    Equipment Recommendations       Recommendations for Other Services       Precautions / Restrictions Precautions Precautions: Fall      Mobility  Bed Mobility Overal bed mobility: +2 for physical assistance;Needs Assistance Bed Mobility: Rolling;Sidelying to Sit Rolling: Max assist Sidelying to sit: +2 for physical assistance;+2 for safety/equipment;Max assist       General bed mobility comments: pt very lethargic, multi-modal cues for any pt  participation; pt is not resistant to imposed movement  Transfers Overall transfer level: Needs assistance Equipment used: 1 person hand held assist;None Transfers: Sit to/from [facility] Sit to Stand: Mod assist;+2 safety/equipment Stand pivot transfers: +2 safety/equipment;Min assist       General transfer comment: cues for safety--backing up to chair, hand placement  Ambulation/Gait             General Gait Details: NT d/t lethargy  Stairs            Wheelchair Mobility    Modified Rankin (Stroke Patients Only)       Balance Overall balance assessment: Needs assistance   Sitting balance-Leahy Scale: Fair       Standing balance-Leahy Scale: Poor Standing balance comment: requires UE support to maintain safe static standing, incr postural sway without support                             Pertinent Vitals/Pain Pain Assessment: No/denies pain    Home Living Family/patient expects to be discharged to:: Skilled nursing facility                 Additional Comments: wife not present, pt is not an accurate historian at this time    Prior Function                 Hand Dominance        Extremity/Trunk Assessment   Upper Extremity Assessment: Generalized weakness           Lower Extremity Assessment: Generalized weakness         Communication   Communication: HOH  Cognition Arousal/Alertness: Lethargic;Suspect due to medications Behavior During Therapy: [facility] for tasks assessed/performed Overall Cognitive Status: Impaired/Different from baseline Area of Impairment: Orientation;Attention;Safety/judgement;Following commands;Problem solving Orientation Level: Disoriented to;Situation;Place;Time Current Attention Level: Sustained   Following Commands: Follows one step commands with increased time     Problem Solving: Slow processing;Difficulty sequencing;Requires verbal cues;Requires tactile cues      General Comments      Exercises        Assessment/Plan  PT Assessment Patient needs continued PT services  PT Diagnosis Difficulty walking;Generalized weakness   PT Problem List Decreased strength;Decreased activity tolerance;Decreased balance;Decreased mobility;Decreased safety awareness;Decreased cognition  PT Treatment Interventions DME instruction;Gait training;Functional mobility training;Therapeutic activities;Patient/family education;Therapeutic exercise;Balance training   PT Goals (Current goals can be found in the Care Plan section) Acute  Rehab PT Goals Patient Stated Goal: per chart/wife--SNF  PT Goal Formulation: Patient unable to participate in goal setting Time For Goal Achievement: 04/16/15 Potential to Achieve Goals: Good    Frequency Min 3X/week   Barriers to discharge        Co-evaluation               End of Session Equipment Utilized During Treatment: Gait belt Activity Tolerance: Patient tolerated treatment well;Patient limited by lethargy Patient left: in chair;with call bell/phone within reach;with nursing/sitter in room Nurse Communication: Mobility status         Time: 1120-1135 PT Time Calculation (min) (ACUTE ONLY): 15 min   Charges:   PT Evaluation $Initial PT Evaluation Tier I: 1 Procedure     PT G CodesKenyon Ana 2015/04/12, 11:55 AM

## 2015-04-10 DIAGNOSIS — R410 Disorientation, unspecified: Secondary | ICD-10-CM

## 2015-04-10 LAB — COMPREHENSIVE METABOLIC PANEL
ALT: 11 U/L — ABNORMAL LOW (ref 17–63)
ANION GAP: 4 — AB (ref 5–15)
AST: 14 U/L — ABNORMAL LOW (ref 15–41)
Albumin: 2.7 g/dL — ABNORMAL LOW (ref 3.5–5.0)
Alkaline Phosphatase: 52 U/L (ref 38–126)
BUN: 15 mg/dL (ref 6–20)
CALCIUM: 8.9 mg/dL (ref 8.9–10.3)
CHLORIDE: 105 mmol/L (ref 101–111)
CO2: 31 mmol/L (ref 22–32)
Creatinine, Ser: 1.13 mg/dL (ref 0.61–1.24)
GFR, EST NON AFRICAN AMERICAN: 53 mL/min — AB (ref >60)
Glucose, Bld: 127 mg/dL — ABNORMAL HIGH (ref 65–99)
Potassium: 3.6 mmol/L (ref 3.5–5.1)
SODIUM: 140 mmol/L (ref 135–145)
Total Bilirubin: 0.5 mg/dL (ref 0.3–1.2)
Total Protein: 5.9 g/dL — ABNORMAL LOW (ref 6.5–8.1)

## 2015-04-10 LAB — CBC WITH DIFFERENTIAL/PLATELET
Basophils Absolute: 0 10*3/uL (ref 0.0–0.1)
Basophils Relative: 0 %
EOS ABS: 0.4 10*3/uL (ref 0.0–0.7)
EOS PCT: 6 %
HCT: 33.1 % — ABNORMAL LOW (ref 39.0–52.0)
Hemoglobin: 10.9 g/dL — ABNORMAL LOW (ref 13.0–17.0)
LYMPHS ABS: 2.1 10*3/uL (ref 0.7–4.0)
Lymphocytes Relative: 34 %
MCH: 30.7 pg (ref 26.0–34.0)
MCHC: 32.9 g/dL (ref 30.0–36.0)
MCV: 93.2 fL (ref 78.0–100.0)
MONO ABS: 0.4 10*3/uL (ref 0.1–1.0)
MONOS PCT: 7 %
Neutro Abs: 3.3 10*3/uL (ref 1.7–7.7)
Neutrophils Relative %: 53 %
PLATELETS: 192 10*3/uL (ref 150–400)
RBC: 3.55 MIL/uL — ABNORMAL LOW (ref 4.22–5.81)
RDW: 13.6 % (ref 11.5–15.5)
WBC: 6.2 10*3/uL (ref 4.0–10.5)

## 2015-04-10 MED ORDER — POLYETHYLENE GLYCOL 3350 17 G PO PACK
17.0000 g | PACK | Freq: Every day | ORAL | Status: DC
  Administered 2015-04-10 – 2015-04-12 (×3): 17 g via ORAL

## 2015-04-10 MED ORDER — PHENAZOPYRIDINE HCL 100 MG PO TABS: 100.0000 mg | ORAL_TABLET | Freq: Three times a day (TID) | ORAL | Status: DC

## 2015-04-10 MED ORDER — SENNOSIDES-DOCUSATE SODIUM 8.6-50 MG PO TABS
1.0000 | ORAL_TABLET | Freq: Once | ORAL | Status: AC
  Administered 2015-04-10: 1 via ORAL
  Filled 2015-04-10: qty 1

## 2015-04-10 MED ORDER — SULFAMETHOXAZOLE-TRIMETHOPRIM 800-160 MG PO TABS
1.0000 | ORAL_TABLET | Freq: Two times a day (BID) | ORAL | Status: DC
Start: 2015-04-10 — End: 2015-04-12
  Administered 2015-04-10 – 2015-04-12 (×4): 1 via ORAL
  Filled 2015-04-10 (×5): qty 1

## 2015-04-10 NOTE — Progress Notes (Signed)
CSW continues to assist with d/c planning. Family has chosen [facility] for SNF placement. SNF expects to have an opening tomorrow if pt is stable for d/c. [employer] has been contacted and prior authorization has been requested. A decision form insurance is pending. CSW will continue to follow to assist with d/c planning to SNF.  Werner Lean LCSW [phone]

## 2015-04-10 NOTE — Progress Notes (Signed)
PHARMACY NOTE -  [facility] has been assisting with dosing of Bactrim for UTI. Dosage will be ordered and likely remain stable at 1 DS tab BID and need for further dosage adjustment appears unlikely at present.    Will sign off at this time.  Please reconsult if a change in clinical status warrants re-evaluation of dosage.  Adrian Saran, PharmD, BCPS Pager [phone] 04/10/2015 4:07 PM

## 2015-04-10 NOTE — Progress Notes (Signed)
Pt's heart rate would increase to mid 120s then trend down to 60s. Pt is sleeping. NP Baltazar Najjar notified. She stated to monitor pt.

## 2015-04-10 NOTE — Progress Notes (Signed)
Returned page from [facility] concerning medication for insomnia.  Patient previously have negative affects from insomnia medications therefore unsafe to order new medications.  Discussed with patient who verbalizes understanding.  Decreased lights and turned off tv in the room

## 2015-04-10 NOTE — Progress Notes (Signed)
RN paged hospitalist per patient request for medications to resolve insomnia.  Awaiting response.

## 2015-04-10 NOTE — Progress Notes (Signed)
[facility] Progress Note    Patient: Larry Bradford  [id number]  DOB: 07/07/1916  DOA: 04/06/2015 Date of Service: the patient was seen and examined on 04/10/2015  Subjective: Patient denies any acute complaints. Patient was sitting in the bed eating his breakfast. Nutrition: Eating most of his meals Activity: Admitted with therapy  Brief Summary of Hospitalization: Day 4 of admission Patient initially presented with confusion and was started treatment on ceftriaxone for UTI. Has received Ativan and has been stuporous since. Currently mentation has improved significantly. Patient has had a bedside sitter until yesterday  Assessment and Plan 1. UTI (lower urinary tract infection) Urine is showing coagulase-negative staph. Patien was IV ceftriaxone based on the sensitivity the patient will be switched to Bactrim and will finish a 7 day treatment  2.acute encephalopathy. Multifactorial. Patient does have history of seizure disorders, CT scan 2 is negative for any acute abnormality. Speech therapy has cleared the patient for regular diet On evaluation the patient does not have any focal deficit. Also has participated with physical therapy as well as had 100% of his diet. Most likely medication induced. Continue close monitoring and avoid psychotropic medication unless emergency.  3 Hypertension Continuing home medication  4 Seizures (HCC) Continuing Keppra and oral format.  5  BPH (benign prostatic hyperplasia) Resuming home medications. Continue Foley catheter, since the patient presented with this from home.  DVT Prophylaxis: subcutaneous Heparin Nutrition: Regular diet advance as tolerated Advance goals of care discussion: Full code  Disposition:  Discharge to SNF.  Consultants: None Procedures: None Antibiotics: Ceftriaxone 04/06/2015 start date end date 04/10/2015 Bactrim 04/10/2015 and date end 04/17/2015   Intake/Output Summary (Last 24 hours)  at 04/10/15 1833 Last data filed at 04/10/15 1717  Gross per 24 hour  Intake    475 ml  Output    500 ml  Net    -25 ml   Filed Weights   04/06/15 2031  Weight: 72 kg (158 lb 11.7 oz)    Objective: Physical Exam: Filed Vitals:   04/09/15 2039 04/10/15 0434 04/10/15 0520 04/10/15 1410  BP: 118/97 104/41 126/61 144/71  Pulse: 89 99 78 116  Temp: 98.7 F (37.1 C)  97.5 F (36.4 C) 97.8 F (36.6 C)  TempSrc: Oral  Oral Oral  Resp: 18  18 16   Height:      Weight:      SpO2: 95%  97% 98%     General: Appear in mild distress Cardiovascular: S1 and S2 Present, aortic systolic Murmur, Respiratory: Bilateral Air entry present and Clear to Auscultation, no Crackles, no wheezes Abdomen: Bowel Sound present, Soft and no tenderness Extremities: no Pedal edema, no calf tenderness Neurologic: No gross abnormality.  Data Reviewed: CBC:  Recent Labs Lab 04/03/15 1835 04/06/15 1643 04/07/15 2110 04/10/15 0528  WBC 8.4 8.6 6.1 6.2  NEUTROABS 5.1 5.3  --  3.3  HGB 12.3* 12.6* 11.7* 10.9*  HCT 37.0* 37.6* 35.6* 33.1*  MCV 93.9 94.7 94.4 93.2  PLT 212 198 191 [id number]   Basic Metabolic Panel:  Recent Labs Lab 04/03/15 1835 04/06/15 1643 04/07/15 0705 04/08/15 0434 04/10/15 0528  NA 138 137 137 139 140  K 3.7 4.3 3.9 4.0 3.6  CL 104 104 102 106 105  CO2 28 28 28 28 31   GLUCOSE 88 103* 128* 85 127*  BUN 14 17 18 14 15   CREATININE 1.07 1.35* 1.27* 1.10 1.13  CALCIUM 8.7* 9.1 9.0 8.5* 8.9  MG  --  2.0  --   --   --  Liver Function Tests:  Recent Labs Lab 04/06/15 1643 04/10/15 0528  AST 19 14*  ALT 13* 11*  ALKPHOS 65 52  BILITOT 0.8 0.5  PROT 6.8 5.9*  ALBUMIN 3.3* 2.7*   No results for input(s): LIPASE, AMYLASE in the last 168 hours.  Recent Labs Lab 04/09/15 0410  AMMONIA 32    Cardiac Enzymes:  Recent Labs Lab 04/07/15 0705  TROPONINI <0.03   BNP (last 3 results)  Recent Labs  02/07/15 2154  BNP 48.2    ProBNP (last 3 results) No  results for input(s): PROBNP in the last 8760 hours.  CBG: No results for input(s): GLUCAP in the last 168 hours.  Recent Results (from the past 240 hour(s))  Urine culture     Status: None   Collection Time: 04/06/15  4:30 PM  Result Value Ref Range Status   Specimen Description URINE, CLEAN CATCH  Final   Special Requests NONE  Final   Culture   Final    >=100,000 COLONIES/mL STAPHYLOCOCCUS SPECIES (COAGULASE NEGATIVE) Performed at [facility]    Report Status 04/09/2015 FINAL  Final   Organism ID, Bacteria STAPHYLOCOCCUS SPECIES (COAGULASE NEGATIVE)  Final      Susceptibility   Staphylococcus species (coagulase negative) - MIC*    CIPROFLOXACIN >=8 RESISTANT Resistant     GENTAMICIN <=0.5 SENSITIVE Sensitive     NITROFURANTOIN <=16 SENSITIVE Sensitive     OXACILLIN <=0.25 SENSITIVE Sensitive     TETRACYCLINE >=16 RESISTANT Resistant     VANCOMYCIN <=0.5 SENSITIVE Sensitive     TRIMETH/SULFA <=10 SENSITIVE Sensitive     CLINDAMYCIN <=0.25 SENSITIVE Sensitive     RIFAMPIN <=0.5 SENSITIVE Sensitive     Inducible Clindamycin NEGATIVE Sensitive     * >=100,000 COLONIES/mL STAPHYLOCOCCUS SPECIES (COAGULASE NEGATIVE)  Blood culture (routine x 2)     Status: None (Preliminary result)   Collection Time: 04/06/15  4:46 PM  Result Value Ref Range Status   Specimen Description BLOOD RIGHT ARM  Final   Special Requests BOTTLES DRAWN AEROBIC AND ANAEROBIC 5CC  Final   Culture   Final    NO GROWTH 4 DAYS Performed at [facility]    Report Status PENDING  Incomplete  Blood culture (routine x 2)     Status: None (Preliminary result)   Collection Time: 04/06/15  4:46 PM  Result Value Ref Range Status   Specimen Description RIGHT ANTECUBITAL  Final   Special Requests BOTTLES DRAWN AEROBIC AND ANAEROBIC 5CC  Final   Culture   Final    NO GROWTH 4 DAYS Performed at [facility]    Report Status PENDING  Incomplete  MRSA PCR Screening     Status: None    Collection Time: 04/07/15  7:05 PM  Result Value Ref Range Status   MRSA by PCR NEGATIVE NEGATIVE Final    Comment:        The GeneXpert MRSA Assay (FDA approved for NASAL specimens only), is one component of a comprehensive MRSA colonization surveillance program. It is not intended to diagnose MRSA infection nor to guide or monitor treatment for MRSA infections.      Studies: No results found.  Scheduled Meds: . aspirin EC  81 mg Oral Daily  . feeding supplement (ENSURE ENLIVE)  237 mL Oral BID BM  . finasteride  5 mg Oral Daily  . heparin  5,000 Units Subcutaneous 3 times per day  . levETIRAcetam  500 mg Oral BID  . polyethylene glycol  17 g Oral Daily  . sulfamethoxazole-trimethoprim  1 tablet Oral Q12H  . tamsulosin  0.4 mg Oral QPC supper  . triamcinolone cream  1 application Topical BID   Continuous Infusions:   Time spent: 20 minutes  Author: Berle Mull, MD [facility] Pager: [phone] 04/10/2015 6:33 PM  If 7PM-7AM, please contact night-coverage at [email or web address], password [facility]

## 2015-04-10 NOTE — Progress Notes (Signed)
Physical Therapy Treatment Patient Details Name: Larry Bradford MRN: [id number] DOB: [birth year] Today's Date: 04/10/2015    History of Present Illness Larry Bradford is a 81 y.o. male adm with delirium, UTI; PMH: HTN, CABG,  Szs, OSA    PT Comments    Pt. Presented with improved cognition as he was awake, alert and oriented upon entering room as well as able to consistently follow one step and simple multi-step commands requiring speaking slowly in R ear.HOH.  Pt. Progressing with mobility from last treat as he was able to roll to sidelying with increased time and use of hand rails; sidelying > sit with increased time, HOB elevated and use of handrails with 1 LOB laterally to the R with self correction; upon sitting EOB patient required min guard/min assist to stand and min guard/min assist to ambulate 25' with RW; pt. Presented with step to pattern, decreased step length on R, decreased velocity, HOH, impulsive/decreased safety awareness and requires +2 for follow with recliner.  Pt. Would benefit from 24/hour supervision at SNF    Follow Up Recommendations  SNF;Supervision/Assistance - 24 hour     Equipment Recommendations  Rolling walker with 5" wheels    Recommendations for Other Services       Precautions / Restrictions Precautions Precautions: Fall Restrictions Weight Bearing Restrictions: No    Mobility  Bed Mobility Overal bed mobility: Needs Assistance;+ 2 for safety/equipment Bed Mobility: Rolling;Sidelying to Sit Rolling: Supervision;Modified independent (Device/Increase time) Sidelying to sit: HOB elevated;Supervision;Min guard (used hand rails )       General bed mobility comments: pt. able to roll to sidelying and sidelying > sit with increased time and use of bed rails; upon sitting EOB presented with 1 LOB laterally to R with self correction  Transfers Overall transfer level: Needs assistance Equipment used: Rolling walker (2 wheeled) Transfers: Sit  to/from Stand Sit to Stand: Min assist;Modified independent (Device/Increase time);From elevated surface;Min guard         General transfer comment: pt. requires increased time and elevated surface with TC/VC for hand placement and min guard/min assist for STS from EOB to RW   Ambulation/Gait Ambulation/Gait assistance: Modified independent (Device/Increase time);Min assist;+2 safety/equipment (follow with recliner/chair ) Ambulation Distance (Feet): 25 Feet Assistive device: Rolling walker (2 wheeled) Gait Pattern/deviations: Step-to pattern;Decreased step length - right;Decreased stance time - left;Decreased stride length;Decreased weight shift to left;Trunk flexed Gait velocity: decreased    General Gait Details: pt. ambulated 25' with RW and min assist plus follow with recliner; during ambulation pt. c/o L toe pain with moaning and grimacing; at 25' patient started to present with slight knee buckling - sat in recliner and brought back to room  Unsteady gait HIGH FALL RISK  Stairs            Wheelchair Mobility    Modified Rankin (Stroke Patients Only)       Balance                                    Cognition Arousal/Alertness: Awake/alert Behavior During Therapy: WFL for tasks assessed/performed Overall Cognitive Status: No family/caregiver present to determine baseline cognitive functioning Area of Impairment:  (HOH )   Current Attention Level: Focused;Sustained   Following Commands: Follows one step commands consistently;Follows one step commands with increased time;Follows multi-step commands consistently;Follows multi-step commands with increased time [facility] )       General Comments: patient  presented with improved cognition; alert and oriented upon entering room, able to follow one step and simple multi-step commands requiring speaking into R ear with slower pronunciation    Exercises      General Comments        Pertinent Vitals/Pain  Pain Assessment: Faces Faces Pain Scale: Hurts little more Pain Location: L big toe  Pain Descriptors / Indicators: Grimacing;Moaning Pain Intervention(s): Limited activity within patient's tolerance;Monitored during session;Repositioned;Other (comment) (inspected big toe, saw indentation from O2 monitor, removed and notified nursing )    Home Living                      Prior Function            PT Goals (current goals can now be found in the care plan section) Acute Rehab PT Goals Time For Goal Achievement: 04/16/15 Potential to Achieve Goals: Good Progress towards PT goals: Progressing toward goals    Frequency  Min 3X/week    PT Plan Current plan remains appropriate    Co-evaluation             End of Session Equipment Utilized During Treatment: Gait belt Activity Tolerance: Patient tolerated treatment well;Patient limited by pain Patient left: in chair;with call bell/phone within reach;with chair alarm set     Time: [id number] PT Time Calculation (min) (ACUTE ONLY): 25 min  Charges:  $Gait Training: 8-22 mins $Therapeutic Activity: 8-22 mins                    G CodesDenna Haggard, SPTA   04/10/2015 10:13 AM   Pager: [phone]   Reviewed above Rica Koyanagi  PTA [facility]  Acute  Rehab Pager      [phone]

## 2015-04-11 ENCOUNTER — Inpatient Hospital Stay (HOSPITAL_COMMUNITY): Payer: Medicare Other

## 2015-04-11 DIAGNOSIS — K5909 Other constipation: Secondary | ICD-10-CM

## 2015-04-11 LAB — CBC WITH DIFFERENTIAL/PLATELET
BASOS ABS: 0 10*3/uL (ref 0.0–0.1)
BASOS PCT: 0 %
Eosinophils Absolute: 0.4 10*3/uL (ref 0.0–0.7)
Eosinophils Relative: 6 %
HEMATOCRIT: 35.5 % — AB (ref 39.0–52.0)
HEMOGLOBIN: 11.8 g/dL — AB (ref 13.0–17.0)
LYMPHS PCT: 33 %
Lymphs Abs: 1.9 10*3/uL (ref 0.7–4.0)
MCH: 31.2 pg (ref 26.0–34.0)
MCHC: 33.2 g/dL (ref 30.0–36.0)
MCV: 93.9 fL (ref 78.0–100.0)
Monocytes Absolute: 0.4 10*3/uL (ref 0.1–1.0)
Monocytes Relative: 7 %
NEUTROS ABS: 3.1 10*3/uL (ref 1.7–7.7)
NEUTROS PCT: 54 %
Platelets: 203 10*3/uL (ref 150–400)
RBC: 3.78 MIL/uL — ABNORMAL LOW (ref 4.22–5.81)
RDW: 13.9 % (ref 11.5–15.5)
WBC: 5.7 10*3/uL (ref 4.0–10.5)

## 2015-04-11 LAB — VITAMIN B1: Vitamin B1 (Thiamine): 161.3 nmol/L (ref 66.5–200.0)

## 2015-04-11 LAB — COMPREHENSIVE METABOLIC PANEL
ALBUMIN: 2.9 g/dL — AB (ref 3.5–5.0)
ALK PHOS: 51 U/L (ref 38–126)
ALT: 13 U/L — AB (ref 17–63)
AST: 17 U/L (ref 15–41)
Anion gap: 4 — ABNORMAL LOW (ref 5–15)
BILIRUBIN TOTAL: 0.4 mg/dL (ref 0.3–1.2)
BUN: 17 mg/dL (ref 6–20)
CALCIUM: 9 mg/dL (ref 8.9–10.3)
CO2: 28 mmol/L (ref 22–32)
Chloride: 109 mmol/L (ref 101–111)
Creatinine, Ser: 1.14 mg/dL (ref 0.61–1.24)
GFR calc Af Amer: >60 mL/min (ref >60)
GFR, EST NON AFRICAN AMERICAN: 52 mL/min — AB (ref >60)
Glucose, Bld: 108 mg/dL — ABNORMAL HIGH (ref 65–99)
Potassium: 4.3 mmol/L (ref 3.5–5.1)
Sodium: 141 mmol/L (ref 135–145)
TOTAL PROTEIN: 6.2 g/dL — AB (ref 6.5–8.1)

## 2015-04-11 LAB — LACTIC ACID, PLASMA: Lactic Acid, Venous: 0.9 mmol/L (ref 0.5–2.0)

## 2015-04-11 LAB — CULTURE, BLOOD (ROUTINE X 2)
CULTURE: NO GROWTH
Culture: NO GROWTH

## 2015-04-11 MED ORDER — SENNOSIDES-DOCUSATE SODIUM 8.6-50 MG PO TABS
2.0000 | ORAL_TABLET | Freq: Two times a day (BID) | ORAL | Status: DC
  Administered 2015-04-11 – 2015-04-12 (×2): 2 via ORAL
  Filled 2015-04-11 (×3): qty 2

## 2015-04-11 MED ORDER — BISACODYL 10 MG RE SUPP
10.0000 mg | Freq: Once | RECTAL | Status: AC
  Administered 2015-04-11: 10 mg via RECTAL

## 2015-04-11 MED ORDER — POLYETHYLENE GLYCOL 3350 17 G PO PACK: 17.0000 g | PACK | Freq: Every day | ORAL | Status: AC

## 2015-04-11 MED ORDER — ENSURE ENLIVE PO LIQD: 237.0000 mL | Freq: Two times a day (BID) | ORAL | Status: DC

## 2015-04-11 MED ORDER — MAGNESIUM HYDROXIDE 400 MG/5ML PO SUSP: 15.0000 mL | Freq: Every day | ORAL | Status: DC

## 2015-04-11 MED ORDER — SULFAMETHOXAZOLE-TRIMETHOPRIM 800-160 MG PO TABS: 1.0000 | ORAL_TABLET | Freq: Two times a day (BID) | ORAL | Status: AC

## 2015-04-11 MED ORDER — METOPROLOL SUCCINATE ER 25 MG PO TB24
25.0000 mg | ORAL_TABLET | Freq: Every day | ORAL | Status: DC
  Administered 2015-04-11 – 2015-04-12 (×2): 25 mg via ORAL
  Filled 2015-04-11 (×2): qty 1

## 2015-04-11 MED ORDER — MIRTAZAPINE 7.5 MG PO TABS: 7.5000 mg | ORAL_TABLET | Freq: Every evening | ORAL | Status: DC | PRN

## 2015-04-11 NOTE — Progress Notes (Signed)
[facility] Progress Note    Patient: Larry Bradford  [id number]  DOB: April 18, 1917  DOA: 04/06/2015 Date of Service: the patient was seen and examined on 04/11/2015  Subjective: patient mentions he has not been able to have any BM and feels bloated. Nutrition: Eating most of his meals, but not hungry now Activity: feeling tired at present but did ambulated well yesterday with therapy  Brief Summary of Hospitalization: Day 5 of admission Patient initially presented with confusion and was started treatment on ceftriaxone for UTI. Has received Ativan and has been stuporous since. Currently mentation has improved significantly. Patient has had a bedside sitter until 04/09/15  Assessment and Plan 1. UTI (lower urinary tract infection) Urine is showing coagulase-negative staph. Patien was IV ceftriaxone based on the sensitivity the patient will be switched to Bactrim and will finish a 7 day treatment  2.acute encephalopathy. Multifactorial. Patient does have history of seizure disorders, CT scan 2 is negative for any acute abnormality. Speech therapy has cleared the patient for regular diet On evaluation the patient does not have any focal deficit. Also has participated with physical therapy as well as had 100% of his diet. Most likely medication induced. Continue close monitoring and avoid psychotropic medication unless emergency.  3 constipation Patient did have some abdominal tenderness on the examination X-ray of the abdomen did not show any acute intra-abdominal pathology, lactic acid was normal, creatinine LFT were also normal. Patient has not been able to have any BM despite being on miralax Will attempt enema and suppository Also add sennacote.  4Hypertension Resume Toprol-XL  5 Seizures (HCC) Continuing Keppra oral  6  BPH (benign prostatic hyperplasia) Resuming home medications. Continue Foley catheter, since the patient presented with this from  home. Patient will follow-up with urology as an outpatient for further management  DVT Prophylaxis: subcutaneous Heparin Nutrition: Regular diet advance as tolerated Advance goals of care discussion: Full code  Disposition:  Discharge to SNF pending resolution of constipation.  Consultants: None Procedures: None Antibiotics: Ceftriaxone 04/06/2015 start date end date 04/10/2015 Bactrim 04/10/2015 and date end 04/17/2015   Intake/Output Summary (Last 24 hours) at 04/11/15 1342 Last data filed at 04/11/15 0546  Gross per 24 hour  Intake     60 ml  Output    475 ml  Net   -415 ml   Filed Weights   04/06/15 2031  Weight: 72 kg (158 lb 11.7 oz)    Objective: Physical Exam: Filed Vitals:   04/10/15 0520 04/10/15 1410 04/10/15 2035 04/11/15 0543  BP: 126/61 144/71 119/53 132/59  Pulse: 78 116 116 125  Temp: 97.5 F (36.4 C) 97.8 F (36.6 C) 98.2 F (36.8 C) 97.8 F (36.6 C)  TempSrc: Oral Oral Oral Oral  Resp: 18 16 17 16   Height:      Weight:      SpO2: 97% 98% 94% 98%     General: Appear in mild distress Cardiovascular: S1 and S2 Present, aortic systolic Murmur, Respiratory: Bilateral Air entry present and Clear to Auscultation, no Crackles, no wheezes Abdomen: Bowel Sound present, Soft and mild diffuse tenderness Extremities: no Pedal edema, no calf tenderness Neurologic: No gross abnormality.  Data Reviewed: CBC:  Recent Labs Lab 04/06/15 1643 04/07/15 2110 04/10/15 0528 04/11/15 0850  WBC 8.6 6.1 6.2 5.7  NEUTROABS 5.3  --  3.3 3.1  HGB 12.6* 11.7* 10.9* 11.8*  HCT 37.6* 35.6* 33.1* 35.5*  MCV 94.7 94.4 93.2 93.9  PLT 198 191 192 203  Basic Metabolic Panel:  Recent Labs Lab 04/06/15 1643 04/07/15 0705 04/08/15 0434 04/10/15 0528 04/11/15 0850  NA 137 137 139 140 141  K 4.3 3.9 4.0 3.6 4.3  CL 104 102 106 105 109  CO2 28 28 28 31 28   GLUCOSE 103* 128* 85 127* 108*  BUN 17 18 14 15 17   CREATININE 1.35* 1.27* 1.10 1.13 1.14  CALCIUM  9.1 9.0 8.5* 8.9 9.0  MG 2.0  --   --   --   --    Liver Function Tests:  Recent Labs Lab 04/06/15 1643 04/10/15 0528 04/11/15 0850  AST 19 14* 17  ALT 13* 11* 13*  ALKPHOS 65 52 51  BILITOT 0.8 0.5 0.4  PROT 6.8 5.9* 6.2*  ALBUMIN 3.3* 2.7* 2.9*     Recent Labs Lab 04/09/15 0410  AMMONIA 32    Cardiac Enzymes:  Recent Labs Lab 04/07/15 0705  TROPONINI <0.03   BNP (last 3 results)  Recent Labs  02/07/15 2154  BNP 48.2    Recent Results (from the past 240 hour(s))  Urine culture     Status: None   Collection Time: 04/06/15  4:30 PM  Result Value Ref Range Status   Specimen Description URINE, CLEAN CATCH  Final   Special Requests NONE  Final   Culture   Final    >=100,000 COLONIES/mL STAPHYLOCOCCUS SPECIES (COAGULASE NEGATIVE) Performed at [facility]    Report Status 04/09/2015 FINAL  Final   Organism ID, Bacteria STAPHYLOCOCCUS SPECIES (COAGULASE NEGATIVE)  Final      Susceptibility   Staphylococcus species (coagulase negative) - MIC*    CIPROFLOXACIN >=8 RESISTANT Resistant     GENTAMICIN <=0.5 SENSITIVE Sensitive     NITROFURANTOIN <=16 SENSITIVE Sensitive     OXACILLIN <=0.25 SENSITIVE Sensitive     TETRACYCLINE >=16 RESISTANT Resistant     VANCOMYCIN <=0.5 SENSITIVE Sensitive     TRIMETH/SULFA <=10 SENSITIVE Sensitive     CLINDAMYCIN <=0.25 SENSITIVE Sensitive     RIFAMPIN <=0.5 SENSITIVE Sensitive     Inducible Clindamycin NEGATIVE Sensitive     * >=100,000 COLONIES/mL STAPHYLOCOCCUS SPECIES (COAGULASE NEGATIVE)  Blood culture (routine x 2)     Status: None (Preliminary result)   Collection Time: 04/06/15  4:46 PM  Result Value Ref Range Status   Specimen Description BLOOD RIGHT ARM  Final   Special Requests BOTTLES DRAWN AEROBIC AND ANAEROBIC 5CC  Final   Culture   Final    NO GROWTH 4 DAYS Performed at [facility]    Report Status PENDING  Incomplete  Blood culture (routine x 2)     Status: None (Preliminary result)    Collection Time: 04/06/15  4:46 PM  Result Value Ref Range Status   Specimen Description RIGHT ANTECUBITAL  Final   Special Requests BOTTLES DRAWN AEROBIC AND ANAEROBIC 5CC  Final   Culture   Final    NO GROWTH 4 DAYS Performed at [facility]    Report Status PENDING  Incomplete  MRSA PCR Screening     Status: None   Collection Time: 04/07/15  7:05 PM  Result Value Ref Range Status   MRSA by PCR NEGATIVE NEGATIVE Final    Comment:        The GeneXpert MRSA Assay (FDA approved for NASAL specimens only), is one component of a comprehensive MRSA colonization surveillance program. It is not intended to diagnose MRSA infection nor to guide or monitor treatment for MRSA infections.  Studies: Dg Abd 2 Views  04/11/2015  CLINICAL DATA:  Abdominal pain EXAM: ABDOMEN - 2 VIEW COMPARISON:  CT abdomen and pelvis 04/03/2015 FINDINGS: Retained barium in distal colon and rectum. Sigmoid and descending colonic diverticulosis. Nonobstructive bowel gas pattern. No bowel dilatation, bowel wall thickening or free intraperitoneal air. Diffuse osseous demineralization with compression fractures of T12 and L2. Degenerative disc and facet disease changes lumbar spine. Atherosclerotic calcifications including at a segment of aneurysmal dilatation of the distal abdominal aorta, better visualized and measured on prior CT. Questionable tiny nonobstructing inferior pole RIGHT renal calculus. IMPRESSION: No evidence of bowel obstruction or perforation. Distal colonic diverticulosis. Abdominal aortic aneurysm, please refer to recent CT abdomen/pelvis exam. Compression fractures of T12 and L2. Question tiny nonobstructing RIGHT renal calculus. Electronically Signed   By: Lavonia Dana M.D.   On: 04/11/2015 09:49    Scheduled Meds: . aspirin EC  81 mg Oral Daily  . feeding supplement (ENSURE ENLIVE)  237 mL Oral BID BM  . finasteride  5 mg Oral Daily  . heparin  5,000 Units Subcutaneous 3 times per day    . levETIRAcetam  500 mg Oral BID  . metoprolol succinate  25 mg Oral Daily  . polyethylene glycol  17 g Oral Daily  . senna-docusate  2 tablet Oral BID  . sulfamethoxazole-trimethoprim  1 tablet Oral Q12H  . tamsulosin  0.4 mg Oral QPC supper  . triamcinolone cream  1 application Topical BID   Continuous Infusions:   Time spent: 20 minutes  Author: Berle Mull, MD [facility] Pager: [phone] 04/11/2015 1:42 PM  If 7PM-7AM, please contact night-coverage at [email or web address], password [facility]

## 2015-04-11 NOTE — Discharge Summary (Addendum)
Discharge Summary [facility]   Patient: Larry Bradford   [id number]  DOB: February 18, 1917  PCP: Doristine Devoid, MD Date of admission: 04/06/2015  Date of discharge: 04/12/2015   Discharge Diagnoses:  Principal Problem:   UTI (lower urinary tract infection), secondary to chronic indwelling Foley catheter Active Problems:   Hypertension   Seizures (HCC)   BPH (benign prostatic hyperplasia)   CAD (coronary artery disease)   Aortic valve disorder   Acute encephalopathy   Delirium   Recommendations for Outpatient Follow-up:  1. contact vascular surgery department for new referral for AAA 2. Follow-up with urology for Foley catheter management 3. [facility] 4. Medications and dosages: Completed the course of Bactrim for 7 days  Diet recommendation: Regular diet  Activity: No restriction  Discharge Condition: Stable  History of present illness: patient was brought in by family with complaints of progressively worsening confusion, talking out of his head, frequent fall patient was started on Levaquin as an outpatient for UTI which is mid confusion worse.  Hospital Course:  1. Acute encephalopathy and delirium secondary to UTI  UTI most likely secondary to chronic indwelling Foley catheter.  Patient was admitted on 04/06/2015 for urinary tract infection. Started on IV ceftriaxone. Patient received Ativan on the first night of admission and then the patient remained drowsy and stuporous. On 04/08/2015 All his medications were changed to IV and he was kept nothing by mouth. On 04/09/2015 the patient was more awake and alert, PT OT and speech therapy evaluation was performed and patient's diet was advanced. Urine culture grew coagulase-negative staphylococci and his antibiotic was changed to Bactrim. Although the patient improved clinically even on on IV ceftriaxone and therefore the patient will complete a total of 7 days of Bactrim with 4 days of  ceftriaxone.  2. Patient has history of BPH and urinary retention and had a Foley catheter prior to arrival to the hospital. Patient will follow-up with urology for further recommendation on the Foley catheter.  3. Abdominal pain  Patient was complaining of some abdominal tenderness on the day of the discharge. X-ray of the abdomen does not show any evidence of acute intra-abdominal pathology, lactic acid is normal, labs are also unremarkable. This is most likely consistent with constipation. Patient was given an MRI as well as a suppository and patient did have a bowel movement with improvement in his symptom.  4 Hypertension. Patient's Toprol-XL was resumed on discharge.  5 History of seizure. No history of evidence seizure in the hospital. CT scan 2 remain negative. Keppra was initially given IV and later on was switched to oral. Continue oral Keppra.  Procedures and Results:  Speech therapy evaluation recommendation is appropriate for regular solid   Consultations:  None  Discharge Exam: Filed Weights   04/06/15 2031  Weight: 72 kg (158 lb 11.7 oz)   Filed Vitals:   04/12/15 0608  BP: 107/67  Pulse: 67  Temp: 97.5 F (36.4 C)  Resp: 18    General: alert awake and oriented complaints of constipation Cardiovascular: S1 and S2 present Respiratory: Clear to auscultation GI: non tender, Bowel sounds present.  DISCHARGE MEDICATION: Discharge Instructions    Ambulatory referral to Vascular Surgery    Complete by:  As directed   Infrarenal AAA 4.5 cm,     Diet - low sodium heart healthy    Complete by:  As directed      Increase activity slowly    Complete by:  As directed  Current Discharge Medication List    START taking these medications   Details  polyethylene glycol (MIRALAX / GLYCOLAX) packet Take 17 g by mouth daily. Qty: 14 each, Refills: 0    senna-docusate (SENOKOT-S) 8.6-50 MG tablet Take 2 tablets by mouth 2 (two) times daily. Qty: 30  tablet, Refills: 0    sulfamethoxazole-trimethoprim (BACTRIM DS,SEPTRA DS) 800-160 MG tablet Take 1 tablet by mouth every 12 (twelve) hours. Qty: 13 tablet, Refills: 0      CONTINUE these medications which have CHANGED   Details  feeding supplement, ENSURE ENLIVE, (ENSURE ENLIVE) LIQD Take 237 mLs by mouth 2 (two) times daily between meals. Qty: 237 mL, Refills: 12    mirtazapine (REMERON) 7.5 MG tablet Take 1 tablet (7.5 mg total) by mouth at bedtime as needed.      CONTINUE these medications which have NOT CHANGED   Details  aspirin EC 81 MG tablet Take 1 tablet (81 mg total) by mouth daily.    finasteride (PROSCAR) 5 MG tablet Take 1 tablet (5 mg total) by mouth daily.    levETIRAcetam (KEPPRA) 500 MG tablet Take 1 tablet (500 mg total) by mouth every 12 (twelve) hours. Qty: 30 tablet, Refills: 0    metoprolol succinate (TOPROL-XL) 25 MG 24 hr tablet Take 1 tablet (25 mg total) by mouth daily.    mirabegron ER (MYRBETRIQ) 25 MG TB24 tablet Take 25 mg by mouth daily.    tamsulosin (FLOMAX) 0.4 MG CAPS capsule Take 1 capsule (0.4 mg total) by mouth daily after supper.    triamcinolone cream (KENALOG) 0.1 % Apply 1 application topically 2 (two) times daily.      STOP taking these medications     traMADol (ULTRAM) 50 MG tablet        Allergies  Allergen Reactions  . Benadryl [Diphenhydramine Hcl]     "talks out of his head"  . Oxycodone     Feels crazy  . Tylenol [Acetaminophen]     On MAR   Follow-up Information    Follow up with [facility] .   Specialty:  Skilled Nursing Facility   Contact information:   [place] [phone]      Follow up with Malka So, MD. Schedule an appointment as soon as possible for a visit in 1 week.   Specialty:  Urology   Why:  BPH Foley catheter urinary retention   Contact information:   [place] [address] [phone]       Follow up  with Doristine Devoid, MD. Schedule an appointment as soon as possible for a visit in 1 week.   Specialty:  Family Medicine      The results of significant diagnostics from this hospitalization (including imaging, microbiology, ancillary and laboratory) are listed below for reference.    Significant Diagnostic Studies: Dg Chest 2 View  04/06/2015  CLINICAL DATA:  [place] today.  Confusion. EXAM: CHEST  2 VIEW COMPARISON:  04/03/2015 FINDINGS: The heart is enlarged but stable. Stable surgical changes from bypass surgery. Low lung volumes with vascular crowding and streaky basilar atelectasis. No definite infiltrates or effusions. The bony thorax is intact. Stable remote compression fracture of L1. IMPRESSION: Stable cardiac enlargement. Low lung volumes with vascular crowding and streaky bibasilar atelectasis. No definite infiltrates, effusions or edema. Electronically Signed   By: Marijo Sanes M.D.   On: 04/06/2015 16:09   Dg Chest 2 View  04/03/2015  CLINICAL  DATA:  81 year old male presents with generalized weakeness x 3-4 weeks w/ decreased body temp of 91. Hx of CABG, seizure, CAD, HTN, EXAM: CHEST  2 VIEW COMPARISON:  02/07/2015 FINDINGS: Lateral view degraded by patient arm position. Prior median sternotomy. Mild vertebral body height loss of an Upper lumbar or lumbosacral junction body, chronic. Midline trachea. Numerous leads and wires project over the chest. Moderate cardiomegaly. No pleural effusion or pneumothorax. No congestive failure. Clear lungs. IMPRESSION: Cardiomegaly, without acute disease. Electronically Signed   By: Abigail Miyamoto M.D.   On: 04/03/2015 19:06   Ct Head Wo Contrast  04/08/2015  CLINICAL DATA:  Acute onset confusion and altered mental status EXAM: CT HEAD WITHOUT CONTRAST TECHNIQUE: Contiguous axial images were obtained from the base of the skull through the vertex without intravenous contrast. COMPARISON:  April 06, 2015 FINDINGS: There is mild generalized atrophy 3  there is no intracranial mass, hemorrhage, extra-axial fluid collection, or midline shift. Patchy small vessel disease in the centra semiovale bilaterally is stable. There is a prior lacunar infarct in the left thalamus, stable. There is a prior small lacunar infarct at the genu of the right internal capsule, stable. No acute infarct evident. Bony calvarium appears intact. The mastoid air cells are clear. There is a stable retention cyst in right maxillary antrum. IMPRESSION: Stable atrophy with periventricular small vessel disease. Prior small infarcts in the left thalamus and right internal capsule are stable. No acute infarct evident. No mass or hemorrhage. Stable retention cyst in right maxillary antrum. Electronically Signed   By: Lowella Grip III M.D.   On: 04/08/2015 15:13   Ct Head Wo Contrast  04/06/2015  CLINICAL DATA:  Fall.  Confusion. EXAM: CT HEAD WITHOUT CONTRAST CT CERVICAL SPINE WITHOUT CONTRAST TECHNIQUE: Multidetector CT imaging of the head and cervical spine was performed following the standard protocol without intravenous contrast. Multiplanar CT image reconstructions of the cervical spine were also generated. COMPARISON:  02/07/2015 head and cervical spine CT. FINDINGS: CT HEAD FINDINGS No evidence of parenchymal hemorrhage or extra-axial fluid collection. No mass lesion, mass effect, or midline shift. No CT evidence of acute infarction. There is intracranial atherosclerosis. There is stable diffuse cerebral volume loss. There is stable encephalomalacia in the right temporal lobe with stable ex vacuo dilatation of the temporal horn of the right lateral ventricle. Stable lacunar infarct in the left thalamus. Nonspecific stable subcortical and periventricular white matter hypodensity, most in keeping with chronic small vessel ischemic change. No acute ventriculomegaly. Stable mucous retention cyst versus polyp in the posterior inferior right maxillary sinus, decreased in size since  12/20/2012. The visualized paranasal sinuses are otherwise essentially clear. The mastoid air cells are unopacified. No evidence of calvarial fracture. CT CERVICAL SPINE FINDINGS No fracture or acute subluxation is detected in the cervical spine. No prevertebral soft tissue swelling. There is straightening of the cervical spine, usually due to positioning and/or muscle spasm. Dens is well positioned between the lateral masses of C1. The lateral masses appear well-aligned. Marked degenerative disc disease in the mid to lower cervical spine, most prominent at C5-6. Marked bilateral facet arthropathy, asymmetric to the left. Stable degenerative 3 mm anterolisthesis at C4-5 and C5-6. Mild degenerative foraminal stenosis on the left at C3-4 and bilaterally at C4-5. Visualized mastoid air cells appear clear. No evidence of intra-axial hemorrhage in the visualized brain. No gross cervical canal hematoma. No acute consolidative airspace disease or significant pulmonary nodules at the visualized lung apices. No appreciable cervical adenopathy  or other cervical spine soft tissue abnormality. IMPRESSION: 1.  No evidence of acute intracranial abnormality. 2. Stable intracranial atherosclerosis, diffuse cerebral volume loss, right temporal lobe encephalomalacia, left colonic infarct and chronic small vessel ischemic white matter change. 3. No fracture or acute malalignment in the cervical spine. 4. Marked degenerative changes in the cervical spine as described. Electronically Signed   By: Ilona Sorrel M.D.   On: 04/06/2015 16:34   Ct Cervical Spine Wo Contrast  04/06/2015  CLINICAL DATA:  Fall.  Confusion. EXAM: CT HEAD WITHOUT CONTRAST CT CERVICAL SPINE WITHOUT CONTRAST TECHNIQUE: Multidetector CT imaging of the head and cervical spine was performed following the standard protocol without intravenous contrast. Multiplanar CT image reconstructions of the cervical spine were also generated. COMPARISON:  02/07/2015 head and  cervical spine CT. FINDINGS: CT HEAD FINDINGS No evidence of parenchymal hemorrhage or extra-axial fluid collection. No mass lesion, mass effect, or midline shift. No CT evidence of acute infarction. There is intracranial atherosclerosis. There is stable diffuse cerebral volume loss. There is stable encephalomalacia in the right temporal lobe with stable ex vacuo dilatation of the temporal horn of the right lateral ventricle. Stable lacunar infarct in the left thalamus. Nonspecific stable subcortical and periventricular white matter hypodensity, most in keeping with chronic small vessel ischemic change. No acute ventriculomegaly. Stable mucous retention cyst versus polyp in the posterior inferior right maxillary sinus, decreased in size since 12/20/2012. The visualized paranasal sinuses are otherwise essentially clear. The mastoid air cells are unopacified. No evidence of calvarial fracture. CT CERVICAL SPINE FINDINGS No fracture or acute subluxation is detected in the cervical spine. No prevertebral soft tissue swelling. There is straightening of the cervical spine, usually due to positioning and/or muscle spasm. Dens is well positioned between the lateral masses of C1. The lateral masses appear well-aligned. Marked degenerative disc disease in the mid to lower cervical spine, most prominent at C5-6. Marked bilateral facet arthropathy, asymmetric to the left. Stable degenerative 3 mm anterolisthesis at C4-5 and C5-6. Mild degenerative foraminal stenosis on the left at C3-4 and bilaterally at C4-5. Visualized mastoid air cells appear clear. No evidence of intra-axial hemorrhage in the visualized brain. No gross cervical canal hematoma. No acute consolidative airspace disease or significant pulmonary nodules at the visualized lung apices. No appreciable cervical adenopathy or other cervical spine soft tissue abnormality. IMPRESSION: 1.  No evidence of acute intracranial abnormality. 2. Stable intracranial  atherosclerosis, diffuse cerebral volume loss, right temporal lobe encephalomalacia, left colonic infarct and chronic small vessel ischemic white matter change. 3. No fracture or acute malalignment in the cervical spine. 4. Marked degenerative changes in the cervical spine as described. Electronically Signed   By: Ilona Sorrel M.D.   On: 04/06/2015 16:34   Ct Abdomen Pelvis W Contrast  04/03/2015  CLINICAL DATA:  81 year old male with weakness and abdominal pain EXAM: CT ABDOMEN AND PELVIS WITH CONTRAST TECHNIQUE: Multidetector CT imaging of the abdomen and pelvis was performed using the standard protocol following bolus administration of intravenous contrast. CONTRAST:  16mL OMNIPAQUE IOHEXOL 300 MG/ML SOLN, 132mL OMNIPAQUE IOHEXOL 300 MG/ML SOLN COMPARISON:  Prior CT abdomen/ pelvis 02/08/2015 FINDINGS: Lower Chest: The visualized lower lungs are clear. Stable borderline cardiomegaly. Atherosclerotic calcification of the aortic valve. No pericardial effusion. Unremarkable distal thoracic esophagus. Abdomen: Unremarkable CT appearance of the stomach, duodenum, spleen, adrenal glands and pancreas. Normal hepatic contour morphology. There are a few tiny circumscribed hypo intense lesions likely representing small cysts or biliary hamartomas. Cholelithiasis without secondary findings  to suggest acute cholecystitis. No intra or extrahepatic biliary ductal dilatation. Multiple circumscribed water attenuation cysts arising from both kidneys. The largest is stable at 9.7 cm exophytic from the lower pole of the left kidney. No enhancing renal mass or hydronephrosis. Colonic diverticular disease without CT evidence of active inflammation. No evidence of obstruction or focal bowel wall thickening. Normal appendix in the right lower quadrant. The terminal ileum is unremarkable. No free fluid or suspicious adenopathy. Pelvis: A Foley catheter is present within the bladder. Additionally, there are 2 small bladder stones. The  bladder wall is circumferentially mildly thickened and there is a suggestion of haziness and stranding in the perivesicular fat. Massive prostatomegaly again noted. The prostate gland measures 7 cm in diameter. No free fluid or suspicious adenopathy. Bones/Soft Tissues: Bilateral fat containing inguinal hernias. No acute fracture or aggressive appearing lytic or blastic osseous lesion. Stable T12 and L2 compression fractures without interval progression of height loss. Multilevel degenerative disc disease. Stable grade 1 anterolisthesis of L4 on L5. Vascular: Irregular and ulcerated fibro fatty plaque versus wall adherent mural thrombus in the abdominal aorta. There is fusiform aneurysmal dilatation of the infrarenal abdominal aorta with a maximal diameter of 4.5 cm which is unchanged compared to the relatively recent prior imaging. Scattered atherosclerotic vascular calcifications. IMPRESSION: 1. Mild circumferential thickening of the bladder wall is nonspecific in the setting of underdistention and massive prostatomegaly. This may simply represent bladder wall trabeculation from chronic bladder outlet obstruction. However, acute cystitis would be difficult to exclude entirely. 2. Two small bladder stones.  A Foley catheter is also present. 3. Mild cardiomegaly with calcification of the aortic valve. Query clinical history of aortic stenosis? 4. Cholelithiasis without secondary findings to suggest acute cholecystitis. 5. Colonic diverticular disease without CT evidence of active inflammation. 6. Multilevel degenerative disc disease and stable grade 1 anterolisthesis of L4 on L5. 7. Stable T12 and L2 compression fractures. 8. Stable 4.5 cm infrarenal abdominal aortic aneurysm. Recommend followup by abdomen and pelvis CTA in 6 months, and vascular surgery referral/consultation if not already obtained. This recommendation follows ACR consensus guidelines: White Paper of the ACR Incidental Findings Committee II on  Vascular Findings. J Am Coll Radiol 2013; 10:789-794. Electronically Signed   By: Jacqulynn Cadet M.D.   On: 04/03/2015 20:30   Dg Abd 2 Views  04/11/2015  CLINICAL DATA:  Abdominal pain EXAM: ABDOMEN - 2 VIEW COMPARISON:  CT abdomen and pelvis 04/03/2015 FINDINGS: Retained barium in distal colon and rectum. Sigmoid and descending colonic diverticulosis. Nonobstructive bowel gas pattern. No bowel dilatation, bowel wall thickening or free intraperitoneal air. Diffuse osseous demineralization with compression fractures of T12 and L2. Degenerative disc and facet disease changes lumbar spine. Atherosclerotic calcifications including at a segment of aneurysmal dilatation of the distal abdominal aorta, better visualized and measured on prior CT. Questionable tiny nonobstructing inferior pole RIGHT renal calculus. IMPRESSION: No evidence of bowel obstruction or perforation. Distal colonic diverticulosis. Abdominal aortic aneurysm, please refer to recent CT abdomen/pelvis exam. Compression fractures of T12 and L2. Question tiny nonobstructing RIGHT renal calculus. Electronically Signed   By: Lavonia Dana M.D.   On: 04/11/2015 09:49    Microbiology: Recent Results (from the past 240 hour(s))  Urine culture     Status: None   Collection Time: 04/06/15  4:30 PM  Result Value Ref Range Status   Specimen Description URINE, CLEAN CATCH  Final   Special Requests NONE  Final   Culture   Final    >=  100,000 COLONIES/mL STAPHYLOCOCCUS SPECIES (COAGULASE NEGATIVE) Performed at [facility]    Report Status 04/09/2015 FINAL  Final   Organism ID, Bacteria STAPHYLOCOCCUS SPECIES (COAGULASE NEGATIVE)  Final      Susceptibility   Staphylococcus species (coagulase negative) - MIC*    CIPROFLOXACIN >=8 RESISTANT Resistant     GENTAMICIN <=0.5 SENSITIVE Sensitive     NITROFURANTOIN <=16 SENSITIVE Sensitive     OXACILLIN <=0.25 SENSITIVE Sensitive     TETRACYCLINE >=16 RESISTANT Resistant     VANCOMYCIN <=0.5  SENSITIVE Sensitive     TRIMETH/SULFA <=10 SENSITIVE Sensitive     CLINDAMYCIN <=0.25 SENSITIVE Sensitive     RIFAMPIN <=0.5 SENSITIVE Sensitive     Inducible Clindamycin NEGATIVE Sensitive     * >=100,000 COLONIES/mL STAPHYLOCOCCUS SPECIES (COAGULASE NEGATIVE)  Blood culture (routine x 2)     Status: None   Collection Time: 04/06/15  4:46 PM  Result Value Ref Range Status   Specimen Description BLOOD RIGHT ARM  Final   Special Requests BOTTLES DRAWN AEROBIC AND ANAEROBIC 5CC  Final   Culture   Final    NO GROWTH 5 DAYS Performed at [facility]    Report Status 04/11/2015 FINAL  Final  Blood culture (routine x 2)     Status: None   Collection Time: 04/06/15  4:46 PM  Result Value Ref Range Status   Specimen Description RIGHT ANTECUBITAL  Final   Special Requests BOTTLES DRAWN AEROBIC AND ANAEROBIC 5CC  Final   Culture   Final    NO GROWTH 5 DAYS Performed at [facility]    Report Status 04/11/2015 FINAL  Final  MRSA PCR Screening     Status: None   Collection Time: 04/07/15  7:05 PM  Result Value Ref Range Status   MRSA by PCR NEGATIVE NEGATIVE Final    Comment:        The GeneXpert MRSA Assay (FDA approved for NASAL specimens only), is one component of a comprehensive MRSA colonization surveillance program. It is not intended to diagnose MRSA infection nor to guide or monitor treatment for MRSA infections.      Labs: Basic Metabolic Panel:  Recent Labs Lab 04/06/15 1643 04/07/15 0705 04/08/15 0434 04/10/15 0528 04/11/15 0850  NA 137 137 139 140 141  K 4.3 3.9 4.0 3.6 4.3  CL 104 102 106 105 109  CO2 28 28 28 31 28   GLUCOSE 103* 128* 85 127* 108*  BUN 17 18 14 15 17   CREATININE 1.35* 1.27* 1.10 1.13 1.14  CALCIUM 9.1 9.0 8.5* 8.9 9.0  MG 2.0  --   --   --   --    Liver Function Tests:  Recent Labs Lab 04/06/15 1643 04/10/15 0528 04/11/15 0850  AST 19 14* 17  ALT 13* 11* 13*  ALKPHOS 65 52 51  BILITOT 0.8 0.5 0.4  PROT 6.8 5.9*  6.2*  ALBUMIN 3.3* 2.7* 2.9*   No results for input(s): LIPASE, AMYLASE in the last 168 hours.  Recent Labs Lab 04/09/15 0410  AMMONIA 32   CBC:  Recent Labs Lab 04/06/15 1643 04/07/15 2110 04/10/15 0528 04/11/15 0850  WBC 8.6 6.1 6.2 5.7  NEUTROABS 5.3  --  3.3 3.1  HGB 12.6* 11.7* 10.9* 11.8*  HCT 37.6* 35.6* 33.1* 35.5*  MCV 94.7 94.4 93.2 93.9  PLT 198 191 192 203   Cardiac Enzymes:  Recent Labs Lab 04/07/15 0705  TROPONINI <0.03   BNP:  Recent Labs  02/07/15 2154  BNP 48.2    Signed:  Samayah Novinger  [facility] 04/12/2015, 10:16 AM

## 2015-04-11 NOTE — Progress Notes (Signed)
CSW assisting with d/c planning. [facility] ( [facility] ) is able to accept pt this week end if stable for d/c. [employer] medicare has provided authorization for either SAT / SUN. Spouse / MD have been updated. Week End CSW will assist with d/c planning to SNF ( [phone] ).  Werner Lean LCSW [phone]

## 2015-04-12 DIAGNOSIS — K5900 Constipation, unspecified: Secondary | ICD-10-CM

## 2015-04-12 MED ORDER — SENNOSIDES-DOCUSATE SODIUM 8.6-50 MG PO TABS: 2.0000 | ORAL_TABLET | Freq: Two times a day (BID) | ORAL | Status: AC

## 2015-04-12 NOTE — Clinical Social Work Placement (Signed)
   CLINICAL SOCIAL WORK PLACEMENT  NOTE  Date:  04/12/2015  Patient Details  Name: Larry Bradford MRN: [id number] Date of Birth: Mar 15, 1917  Clinical Social Work is seeking post-discharge placement for this patient at the [place] level of care (*CSW will initial, date and re-position this form in  chart as items are completed):  Yes   Patient/family provided with [place] Work Department's list of facilities offering this level of care within the geographic area requested by the patient (or if unable, by the patient's family).  Yes   Patient/family informed of their freedom to choose among providers that offer the needed level of care, that participate in Medicare, Medicaid or managed care program needed by the patient, have an available bed and are willing to accept the patient.  Yes   Patient/family informed of 's ownership interest in [facility] and [facility], as well as of the fact that they are under no obligation to receive care at these facilities.  PASRR submitted to EDS on 04/09/15     PASRR number received on       Existing PASRR number confirmed on       FL2 transmitted to all facilities in geographic area requested by pt/family on 04/09/15     FL2 transmitted to all facilities within larger geographic area on       Patient informed that his/her managed care company has contracts with or will negotiate with certain facilities, including the following:            Patient/family informed of bed offers received.  Patient chooses bed at    [place] recommends and patient chooses bed at      Patient to be transferred to  [name] on  .April 11, 2105  Patient to be transferred to facility by   ambulance    Patient family notified on  April 12, 2015 of transfer.  Name of family member notified:    [name] spouse      PHYSICIAN       Additional Comment:     _______________________________________________ Carlean Jews, LCSW 04/12/2015, 1:35 PM

## 2015-04-12 NOTE — Progress Notes (Signed)
Pt discharged to [facility] for rehab via [place]; PTAR provided with Rn's contact. States will have nursing home call to obtain report

## 2015-04-12 NOTE — Progress Notes (Signed)
Report given to [employer] of [facility]

## 2015-04-12 NOTE — Progress Notes (Signed)
Two attempts to call report to [facility] placed. Each time transferred to number with no answer

## 2015-04-12 NOTE — Care Management Important Message (Signed)
Important Message  Patient Details  Name: Larry Bradford MRN: [id number] Date of Birth: 1917-02-25   Medicare Important Message Given:  Yes    Erenest Rasher, RN 04/12/2015, 11:22 AM

## 2015-04-23 ENCOUNTER — Encounter (HOSPITAL_COMMUNITY): Payer: Self-pay | Admitting: Emergency Medicine

## 2015-04-23 ENCOUNTER — Emergency Department (HOSPITAL_COMMUNITY)
Admission: EM | Admit: 2015-04-23 | Discharge: 2015-04-23 | Disposition: A | Discharge status: Home / Self Care | Payer: Medicare Other | Source: Home / Self Care | Attending: Emergency Medicine | Admitting: Emergency Medicine

## 2015-04-23 DIAGNOSIS — Z8739 Personal history of other diseases of the musculoskeletal system and connective tissue: Secondary | ICD-10-CM

## 2015-04-23 DIAGNOSIS — Z8719 Personal history of other diseases of the digestive system: Secondary | ICD-10-CM

## 2015-04-23 DIAGNOSIS — Z79899 Other long term (current) drug therapy: Secondary | ICD-10-CM

## 2015-04-23 DIAGNOSIS — I2510 Atherosclerotic heart disease of native coronary artery without angina pectoris: Secondary | ICD-10-CM | POA: Insufficient documentation

## 2015-04-23 DIAGNOSIS — Z951 Presence of aortocoronary bypass graft: Secondary | ICD-10-CM

## 2015-04-23 DIAGNOSIS — N368 Other specified disorders of urethra: Secondary | ICD-10-CM | POA: Insufficient documentation

## 2015-04-23 DIAGNOSIS — H9190 Unspecified hearing loss, unspecified ear: Secondary | ICD-10-CM

## 2015-04-23 DIAGNOSIS — Z8673 Personal history of transient ischemic attack (TIA), and cerebral infarction without residual deficits: Secondary | ICD-10-CM | POA: Insufficient documentation

## 2015-04-23 DIAGNOSIS — Z7982 Long term (current) use of aspirin: Secondary | ICD-10-CM

## 2015-04-23 DIAGNOSIS — Z8639 Personal history of other endocrine, nutritional and metabolic disease: Secondary | ICD-10-CM | POA: Insufficient documentation

## 2015-04-23 DIAGNOSIS — Z7952 Long term (current) use of systemic steroids: Secondary | ICD-10-CM | POA: Insufficient documentation

## 2015-04-23 DIAGNOSIS — I10 Essential (primary) hypertension: Secondary | ICD-10-CM | POA: Insufficient documentation

## 2015-04-23 DIAGNOSIS — T83511A Infection and inflammatory reaction due to indwelling urethral catheter, initial encounter: Secondary | ICD-10-CM | POA: Diagnosis not present

## 2015-04-23 DIAGNOSIS — N400 Enlarged prostate without lower urinary tract symptoms: Secondary | ICD-10-CM | POA: Insufficient documentation

## 2015-04-23 DIAGNOSIS — F329 Major depressive disorder, single episode, unspecified: Secondary | ICD-10-CM | POA: Insufficient documentation

## 2015-04-23 DIAGNOSIS — H269 Unspecified cataract: Secondary | ICD-10-CM

## 2015-04-23 LAB — CBC WITH DIFFERENTIAL/PLATELET
Basophils Absolute: 0 10*3/uL (ref 0.0–0.1)
Basophils Relative: 0 %
Eosinophils Absolute: 0.6 10*3/uL (ref 0.0–0.7)
Eosinophils Relative: 7 %
HCT: 36.8 % — ABNORMAL LOW (ref 39.0–52.0)
Hemoglobin: 12 g/dL — ABNORMAL LOW (ref 13.0–17.0)
Lymphocytes Relative: 19 %
Lymphs Abs: 1.8 10*3/uL (ref 0.7–4.0)
MCH: 31.5 pg (ref 26.0–34.0)
MCHC: 32.6 g/dL (ref 30.0–36.0)
MCV: 96.6 fL (ref 78.0–100.0)
Monocytes Absolute: 0.5 10*3/uL (ref 0.1–1.0)
Monocytes Relative: 5 %
Neutro Abs: 6.2 10*3/uL (ref 1.7–7.7)
Neutrophils Relative %: 69 %
Platelets: 254 10*3/uL (ref 150–400)
RBC: 3.81 MIL/uL — ABNORMAL LOW (ref 4.22–5.81)
RDW: 14.2 % (ref 11.5–15.5)
WBC: 9.1 10*3/uL (ref 4.0–10.5)

## 2015-04-23 LAB — BASIC METABOLIC PANEL
Anion gap: 4 — ABNORMAL LOW (ref 5–15)
BUN: 27 mg/dL — ABNORMAL HIGH (ref 6–20)
CO2: 27 mmol/L (ref 22–32)
Calcium: 9.2 mg/dL (ref 8.9–10.3)
Chloride: 108 mmol/L (ref 101–111)
Creatinine, Ser: 1.32 mg/dL — ABNORMAL HIGH (ref 0.61–1.24)
GFR calc Af Amer: 51 mL/min — ABNORMAL LOW (ref >60)
GFR calc non Af Amer: 44 mL/min — ABNORMAL LOW (ref >60)
Glucose, Bld: 138 mg/dL — ABNORMAL HIGH (ref 65–99)
Potassium: 4.7 mmol/L (ref 3.5–5.1)
Sodium: 139 mmol/L (ref 135–145)

## 2015-04-23 LAB — URINALYSIS, ROUTINE W REFLEX MICROSCOPIC
Bilirubin Urine: NEGATIVE
Glucose, UA: NEGATIVE mg/dL
Ketones, ur: NEGATIVE mg/dL
Nitrite: NEGATIVE
Protein, ur: NEGATIVE mg/dL
Specific Gravity, Urine: 1.024 (ref 1.005–1.030)
pH: 5 (ref 5.0–8.0)

## 2015-04-23 LAB — URINE MICROSCOPIC-ADD ON

## 2015-04-23 MED ORDER — LIDOCAINE HCL 2 % EX GEL
1.0000 "application " | Freq: Once | CUTANEOUS | Status: AC
  Administered 2015-04-23: 1 via URETHRAL
  Filled 2015-04-23: qty 11

## 2015-04-23 NOTE — ED Notes (Signed)
Bed: [id number] Expected date:  Expected time:  Means of arrival:  Comments: Blood in catheter, 89 yr

## 2015-04-23 NOTE — ED Provider Notes (Signed)
CSN: [id number]     Arrival date & time 04/23/15  2046 History   First MD Initiated Contact with Patient 04/23/15 2100     Chief Complaint  Patient presents with  . Hematuria     (Consider location/radiation/quality/duration/timing/severity/associated sxs/prior Treatment) HPI Patient presents to the emergency department with hematuria noted in his Foley catheter bag.  The daughter states that he has had the catheter in for an undetermined amount of time that has not been changed.  She states that tonight she noticed some blood around the catheter at the tip of the penis and in the catheter bag.  The patient does not have any complaints.  Patient has not had any vomiting, fever, lethargy, rash, abdominal pain, or syncope.  The patient has had several episodes of diarrhea today Past Medical History  Diagnosis Date  . Seizures ([place])     Onset in Sept 2006  . Cataract   . Hard of hearing   . Renal disorder   . Depression   . HTN (hypertension)   . BPH (benign prostatic hyperplasia)   . CAD (coronary artery disease)     s/p CABG in 2002  . Hyperlipidemia   . OSA (obstructive sleep apnea)   . Aortic aneurysm ([place])     3 CM in 2007  . Carotid stenosis   . DDD (degenerative disc disease)   . Trigeminal neuralgia   . Lower GI bleed   . Stroke ([place])   . Allergy   . Aortic stenosis    Past Surgical History  Procedure Laterality Date  . Coronary artery bypass graft  2002  . Temporal artery biopsy / ligation  2004   Family History  Problem Relation Age of Onset  . CAD Brother   . Heart disease Brother   . Cancer Mother    Social History  Substance Use Topics  . Smoking status: Never Smoker   . Smokeless tobacco: Never Used  . Alcohol Use: No    Review of Systems  All other systems negative except as documented in the HPI. All pertinent positives and negatives as reviewed in the HPI.  Allergies  Benadryl; Oxycodone; and Tylenol  Home Medications   Prior to Admission  medications   Medication Sig Start Date End Date Taking? Authorizing Provider  aspirin EC 81 MG tablet Take 1 tablet (81 mg total) by mouth daily. 02/13/15  Yes Shanker Kristeen Mans, MD  feeding supplement, ENSURE ENLIVE, (ENSURE ENLIVE) LIQD Take 237 mLs by mouth 2 (two) times daily between meals. 04/11/15  Yes Lavina Hamman, MD  finasteride (PROSCAR) 5 MG tablet Take 1 tablet (5 mg total) by mouth daily. 02/10/15  Yes Shanker Kristeen Mans, MD  levETIRAcetam (KEPPRA) 500 MG tablet Take 1 tablet (500 mg total) by mouth every 12 (twelve) hours. 01/21/15  Yes Comer Locket, PA-C  loperamide (IMODIUM A-D) 2 MG tablet Take 4 mg by mouth 4 (four) times daily as needed for diarrhea or loose stools.   Yes Historical Provider, MD  metoprolol succinate (TOPROL-XL) 25 MG 24 hr tablet Take 1 tablet (25 mg total) by mouth daily. 02/10/15  Yes Shanker Kristeen Mans, MD  mirabegron ER (MYRBETRIQ) 25 MG TB24 tablet Take 25 mg by mouth daily.   Yes Historical Provider, MD  mirtazapine (REMERON) 7.5 MG tablet Take 1 tablet (7.5 mg total) by mouth at bedtime as needed. Patient taking differently: Take 7.5 mg by mouth at bedtime as needed (sleep).  04/11/15  Yes Lavina Hamman,  MD  polyethylene glycol (MIRALAX / GLYCOLAX) packet Take 17 g by mouth daily. 04/11/15  Yes Lavina Hamman, MD  senna-docusate (SENOKOT-S) 8.6-50 MG tablet Take 2 tablets by mouth 2 (two) times daily. Patient taking differently: Take 1 tablet by mouth daily.  04/12/15  Yes Lavina Hamman, MD  tamsulosin (FLOMAX) 0.4 MG CAPS capsule Take 1 capsule (0.4 mg total) by mouth daily after supper. 02/10/15  Yes Shanker Kristeen Mans, MD  traMADol (ULTRAM) 50 MG tablet Take 50 mg by mouth every 8 (eight) hours as needed for moderate pain.   Yes Historical Provider, MD  triamcinolone cream (KENALOG) 0.1 % Apply 1 application topically 2 (two) times daily.   Yes Historical Provider, MD   BP 113/64 mmHg  Pulse 54  Temp(Src) 97.9 F (36.6 C) (Oral)  Resp 23  Ht 5'  9" (1.753 m)  Wt 73.029 kg  BMI 23.76 kg/m2  SpO2 100% Physical Exam  Constitutional: He is oriented to person, place, and time. He appears well-developed and well-nourished. No distress.  HENT:  Head: Normocephalic and atraumatic.  Mouth/Throat: Oropharynx is clear and moist.  Eyes: Pupils are equal, round, and reactive to light.  Neck: Normal range of motion. Neck supple.  Cardiovascular: Normal rate, regular rhythm and normal heart sounds.  Exam reveals no gallop and no friction rub.   No murmur heard. Pulmonary/Chest: Effort normal and breath sounds normal. No respiratory distress. He has no wheezes.  Abdominal: Soft. Bowel sounds are normal. He exhibits no distension. There is no tenderness.  Neurological: He is alert and oriented to person, place, and time. He exhibits normal muscle tone. Coordination normal.  Skin: Skin is warm and dry. No rash noted. No erythema.  Psychiatric: He has a normal mood and affect. His behavior is normal.  Nursing note and vitals reviewed.   ED Course  Procedures (including critical care time) Labs Review Labs Reviewed  URINALYSIS, ROUTINE W REFLEX MICROSCOPIC (NOT AT [facility]) - Abnormal; Notable for the following:    Color, Urine AMBER (*)    APPearance CLOUDY (*)    Hgb urine dipstick LARGE (*)    Leukocytes, UA SMALL (*)    All other components within normal limits  BASIC METABOLIC PANEL - Abnormal; Notable for the following:    Glucose, Bld 138 (*)    BUN 27 (*)    Creatinine, Ser 1.32 (*)    GFR calc non Af Amer 44 (*)    GFR calc Af Amer 51 (*)    Anion gap 4 (*)    All other components within normal limits  CBC WITH DIFFERENTIAL/PLATELET - Abnormal; Notable for the following:    RBC 3.81 (*)    Hemoglobin 12.0 (*)    HCT 36.8 (*)    All other components within normal limits  URINE MICROSCOPIC-ADD ON - Abnormal; Notable for the following:    Squamous Epithelial / LPF 0-5 (*)    Bacteria, UA RARE (*)    All other components within  normal limits  URINE CULTURE    Imaging Review No results found. I have personally reviewed and evaluated these images and lab results as part of my medical decision-making.  The patient will be discharged home.  We did change his Foley catheter.  I feel like there is some urethral irritation from this Foley catheter.  The family is advised and advised to follow-up with his primary care doctor   Dalia Heading, PA-C [id number]  Delora Fuel, MD [id number]

## 2015-04-23 NOTE — ED Notes (Signed)
[facility] presents with a 81 yo male with blood in the urine/foley catheter.  Pt was in this facility approximately two weeks ago for UTI.  Pt was in [place] and released last Saturday.  No AMS.  Pt HOH.  Diarrhea started today x 3 episodes.  Blood just appeared in urine today.  Family unsure of how long foley catheter has been in.  Pt has distended abdomen also.

## 2015-04-23 NOTE — Discharge Instructions (Signed)
Return here as needed.  Follow-up with your primary care doctor °

## 2015-04-25 ENCOUNTER — Inpatient Hospital Stay (HOSPITAL_COMMUNITY)
Admission: EM | Admit: 2015-04-25 | Discharge: 2015-04-29 | DRG: 698 | Disposition: A | Discharge status: Skilled Nursing Facility | Payer: Medicare Other | Attending: Internal Medicine | Admitting: Internal Medicine

## 2015-04-25 ENCOUNTER — Encounter (HOSPITAL_COMMUNITY): Payer: Self-pay | Admitting: Emergency Medicine

## 2015-04-25 DIAGNOSIS — H9190 Unspecified hearing loss, unspecified ear: Secondary | ICD-10-CM | POA: Diagnosis present

## 2015-04-25 DIAGNOSIS — I471 Supraventricular tachycardia: Secondary | ICD-10-CM | POA: Diagnosis present

## 2015-04-25 DIAGNOSIS — G40909 Epilepsy, unspecified, not intractable, without status epilepticus: Secondary | ICD-10-CM | POA: Diagnosis present

## 2015-04-25 DIAGNOSIS — N400 Enlarged prostate without lower urinary tract symptoms: Secondary | ICD-10-CM | POA: Diagnosis present

## 2015-04-25 DIAGNOSIS — N183 Chronic kidney disease, stage 3 unspecified: Secondary | ICD-10-CM | POA: Diagnosis present

## 2015-04-25 DIAGNOSIS — G9340 Encephalopathy, unspecified: Secondary | ICD-10-CM | POA: Diagnosis present

## 2015-04-25 DIAGNOSIS — R569 Unspecified convulsions: Secondary | ICD-10-CM

## 2015-04-25 DIAGNOSIS — Z8673 Personal history of transient ischemic attack (TIA), and cerebral infarction without residual deficits: Secondary | ICD-10-CM

## 2015-04-25 DIAGNOSIS — N179 Acute kidney failure, unspecified: Secondary | ICD-10-CM | POA: Diagnosis present

## 2015-04-25 DIAGNOSIS — R0902 Hypoxemia: Secondary | ICD-10-CM | POA: Diagnosis present

## 2015-04-25 DIAGNOSIS — N390 Urinary tract infection, site not specified: Secondary | ICD-10-CM | POA: Diagnosis present

## 2015-04-25 DIAGNOSIS — T83511A Infection and inflammatory reaction due to indwelling urethral catheter, initial encounter: Principal | ICD-10-CM | POA: Diagnosis present

## 2015-04-25 DIAGNOSIS — Z7982 Long term (current) use of aspirin: Secondary | ICD-10-CM

## 2015-04-25 DIAGNOSIS — I10 Essential (primary) hypertension: Secondary | ICD-10-CM | POA: Diagnosis present

## 2015-04-25 DIAGNOSIS — Y846 Urinary catheterization as the cause of abnormal reaction of the patient, or of later complication, without mention of misadventure at the time of the procedure: Secondary | ICD-10-CM | POA: Diagnosis present

## 2015-04-25 DIAGNOSIS — Z951 Presence of aortocoronary bypass graft: Secondary | ICD-10-CM

## 2015-04-25 DIAGNOSIS — I25810 Atherosclerosis of coronary artery bypass graft(s) without angina pectoris: Secondary | ICD-10-CM | POA: Diagnosis present

## 2015-04-25 DIAGNOSIS — G9341 Metabolic encephalopathy: Secondary | ICD-10-CM | POA: Diagnosis present

## 2015-04-25 DIAGNOSIS — I2510 Atherosclerotic heart disease of native coronary artery without angina pectoris: Secondary | ICD-10-CM | POA: Diagnosis present

## 2015-04-25 DIAGNOSIS — I350 Nonrheumatic aortic (valve) stenosis: Secondary | ICD-10-CM | POA: Diagnosis present

## 2015-04-25 DIAGNOSIS — R140 Abdominal distension (gaseous): Secondary | ICD-10-CM | POA: Diagnosis present

## 2015-04-25 DIAGNOSIS — R310 Gross hematuria: Secondary | ICD-10-CM | POA: Diagnosis present

## 2015-04-25 DIAGNOSIS — I129 Hypertensive chronic kidney disease with stage 1 through stage 4 chronic kidney disease, or unspecified chronic kidney disease: Secondary | ICD-10-CM | POA: Diagnosis present

## 2015-04-25 DIAGNOSIS — R339 Retention of urine, unspecified: Secondary | ICD-10-CM | POA: Diagnosis present

## 2015-04-25 DIAGNOSIS — G4733 Obstructive sleep apnea (adult) (pediatric): Secondary | ICD-10-CM | POA: Diagnosis present

## 2015-04-25 DIAGNOSIS — E785 Hyperlipidemia, unspecified: Secondary | ICD-10-CM | POA: Diagnosis present

## 2015-04-25 LAB — URINE MICROSCOPIC-ADD ON

## 2015-04-25 LAB — CBC WITH DIFFERENTIAL/PLATELET
BASOS ABS: 0 10*3/uL (ref 0.0–0.1)
BASOS PCT: 0 %
EOS PCT: 5 %
Eosinophils Absolute: 0.5 10*3/uL (ref 0.0–0.7)
HEMATOCRIT: 33.1 % — AB (ref 39.0–52.0)
Hemoglobin: 10.7 g/dL — ABNORMAL LOW (ref 13.0–17.0)
Lymphocytes Relative: 22 %
Lymphs Abs: 2.1 10*3/uL (ref 0.7–4.0)
MCH: 31.3 pg (ref 26.0–34.0)
MCHC: 32.3 g/dL (ref 30.0–36.0)
MCV: 96.8 fL (ref 78.0–100.0)
MONO ABS: 0.8 10*3/uL (ref 0.1–1.0)
MONOS PCT: 8 %
Neutro Abs: 6.4 10*3/uL (ref 1.7–7.7)
Neutrophils Relative %: 65 %
PLATELETS: 228 10*3/uL (ref 150–400)
RBC: 3.42 MIL/uL — ABNORMAL LOW (ref 4.22–5.81)
RDW: 14.2 % (ref 11.5–15.5)
WBC: 9.8 10*3/uL (ref 4.0–10.5)

## 2015-04-25 LAB — BASIC METABOLIC PANEL
Anion gap: 6 (ref 5–15)
BUN: 23 mg/dL — ABNORMAL HIGH (ref 6–20)
CALCIUM: 9.4 mg/dL (ref 8.9–10.3)
CO2: 27 mmol/L (ref 22–32)
CREATININE: 1.32 mg/dL — AB (ref 0.61–1.24)
Chloride: 109 mmol/L (ref 101–111)
GFR calc non Af Amer: 44 mL/min — ABNORMAL LOW (ref >60)
GFR, EST AFRICAN AMERICAN: 51 mL/min — AB (ref >60)
Glucose, Bld: 126 mg/dL — ABNORMAL HIGH (ref 65–99)
Potassium: 4.2 mmol/L (ref 3.5–5.1)
SODIUM: 142 mmol/L (ref 135–145)

## 2015-04-25 LAB — URINALYSIS, ROUTINE W REFLEX MICROSCOPIC
BILIRUBIN URINE: NEGATIVE
Glucose, UA: NEGATIVE mg/dL
KETONES UR: NEGATIVE mg/dL
NITRITE: NEGATIVE
Protein, ur: 100 mg/dL — AB
Specific Gravity, Urine: 1.022 (ref 1.005–1.030)
pH: 5 (ref 5.0–8.0)

## 2015-04-25 LAB — URINE CULTURE: Culture: NO GROWTH

## 2015-04-25 MED ORDER — ONDANSETRON HCL 4 MG/2ML IJ SOLN
4.0000 mg | Freq: Once | INTRAMUSCULAR | Status: DC
  Filled 2015-04-25: qty 2

## 2015-04-25 MED ORDER — LIDOCAINE HCL 2 % EX GEL: 1.0000 "application " | Freq: Once | CUTANEOUS | Status: DC

## 2015-04-25 MED ORDER — ONDANSETRON HCL 4 MG/2ML IJ SOLN
4.0000 mg | Freq: Once | INTRAMUSCULAR | Status: AC
  Administered 2015-04-25: 4 mg via INTRAVENOUS

## 2015-04-25 MED ORDER — ONDANSETRON 4 MG PO TBDP: 4.0000 mg | ORAL_TABLET | Freq: Once | ORAL | Status: DC

## 2015-04-25 MED ORDER — MORPHINE SULFATE (PF) 4 MG/ML IV SOLN
4.0000 mg | Freq: Once | INTRAVENOUS | Status: DC
  Filled 2015-04-25: qty 1

## 2015-04-25 MED ORDER — LIDOCAINE HCL 2 % EX GEL
1.0000 "application " | Freq: Once | CUTANEOUS | Status: AC
  Administered 2015-04-25: 1 via URETHRAL
  Filled 2015-04-25: qty 11

## 2015-04-25 MED ORDER — DEXTROSE 5 % IV SOLN
1.0000 g | Freq: Once | INTRAVENOUS | Status: AC
  Administered 2015-04-25: 1 g via INTRAVENOUS
  Filled 2015-04-25: qty 10

## 2015-04-25 MED ORDER — MORPHINE SULFATE (PF) 4 MG/ML IV SOLN
4.0000 mg | Freq: Once | INTRAVENOUS | Status: AC
  Administered 2015-04-25: 4 mg via INTRAVENOUS

## 2015-04-25 MED ORDER — MORPHINE SULFATE (PF) 4 MG/ML IV SOLN
6.0000 mg | Freq: Once | INTRAVENOUS | Status: DC
Start: 2015-04-25 — End: 2015-04-25
  Filled 2015-04-25: qty 2

## 2015-04-25 NOTE — ED Provider Notes (Signed)
CSN: [id number]     Arrival date & time 04/25/15  2106 History   First MD Initiated Contact with Patient 04/25/15 2126     Chief Complaint  Patient presents with  . Urinary Retention     (Consider location/radiation/quality/duration/timing/severity/associated sxs/prior Treatment) HPI   Larry Bradford is a(n) 81 y.o. male who presents for acute urinary retention. The patient was seen 04/23/2015. The patient has a chronic indwelling foley cath. The patient has had inability to pass urine over the past 12 hours. He complains of severe pain. He denies fever or chills. He is attended by a family member.   Past Medical History  Diagnosis Date  . Seizures ([place])     Onset in Sept 2006  . Cataract   . Hard of hearing   . Renal disorder   . Depression   . HTN (hypertension)   . BPH (benign prostatic hyperplasia)   . CAD (coronary artery disease)     s/p CABG in 2002  . Hyperlipidemia   . OSA (obstructive sleep apnea)   . Aortic aneurysm ([place])     3 CM in 2007  . Carotid stenosis   . DDD (degenerative disc disease)   . Trigeminal neuralgia   . Lower GI bleed   . Stroke ([place])   . Allergy   . Aortic stenosis    Past Surgical History  Procedure Laterality Date  . Coronary artery bypass graft  2002  . Temporal artery biopsy / ligation  2004   Family History  Problem Relation Age of Onset  . CAD Brother   . Heart disease Brother   . Cancer Mother    Social History  Substance Use Topics  . Smoking status: Never Smoker   . Smokeless tobacco: Never Used  . Alcohol Use: No    Review of Systems  Ten systems reviewed and are negative for acute change, except as noted in the HPI.    Allergies  Benadryl; Oxycodone; and Tylenol  Home Medications   Prior to Admission medications   Medication Sig Start Date End Date Taking? Authorizing Provider  aspirin EC 81 MG tablet Take 1 tablet (81 mg total) by mouth daily. 02/13/15  Yes Shanker Kristeen Mans, MD  finasteride (PROSCAR) 5  MG tablet Take 1 tablet (5 mg total) by mouth daily. 02/10/15  Yes Shanker Kristeen Mans, MD  levETIRAcetam (KEPPRA) 500 MG tablet Take 1 tablet (500 mg total) by mouth every 12 (twelve) hours. 01/21/15  Yes Comer Locket, PA-C  metoprolol succinate (TOPROL-XL) 25 MG 24 hr tablet Take 1 tablet (25 mg total) by mouth daily. 02/10/15  Yes Shanker Kristeen Mans, MD  mirabegron ER (MYRBETRIQ) 25 MG TB24 tablet Take 25 mg by mouth daily.   Yes Historical Provider, MD  mirtazapine (REMERON) 7.5 MG tablet Take 1 tablet (7.5 mg total) by mouth at bedtime as needed. Patient taking differently: Take 7.5 mg by mouth at bedtime.  04/11/15  Yes Lavina Hamman, MD  polyethylene glycol (MIRALAX / GLYCOLAX) packet Take 17 g by mouth daily. 04/11/15  Yes Lavina Hamman, MD  senna-docusate (SENOKOT-S) 8.6-50 MG tablet Take 2 tablets by mouth 2 (two) times daily. Patient taking differently: Take 1 tablet by mouth daily.  04/12/15  Yes Lavina Hamman, MD  tamsulosin (FLOMAX) 0.4 MG CAPS capsule Take 1 capsule (0.4 mg total) by mouth daily after supper. 02/10/15  Yes Shanker Kristeen Mans, MD  traMADol (ULTRAM) 50 MG tablet Take 50 mg by mouth every  8 (eight) hours as needed for moderate pain.   Yes Historical Provider, MD  triamcinolone cream (KENALOG) 0.1 % Apply 1 application topically 2 (two) times daily.   Yes Historical Provider, MD  feeding supplement, ENSURE ENLIVE, (ENSURE ENLIVE) LIQD Take 237 mLs by mouth 2 (two) times daily between meals. Patient not taking: Reported on 04/25/2015 04/11/15   Lavina Hamman, MD   BP 133/56 mmHg  Pulse 77  Temp(Src) 97.6 F (36.4 C)  Resp 17  SpO2 100% Physical Exam  Constitutional: He is oriented to person, place, and time. He appears well-developed and well-nourished.  Patient moaning in pain  HENT:  Head: Normocephalic and atraumatic.  Eyes: Conjunctivae are normal. No scleral icterus.  Neck: Normal range of motion. Neck supple.  Cardiovascular: Normal rate, regular rhythm  and normal heart sounds.   Pulmonary/Chest: Effort normal and breath sounds normal. No respiratory distress.  Abdominal: Soft. He exhibits distension. There is tenderness.  suprapubic  Genitourinary:  Penis with urethral dilitation scondary to chronic indwelling catheter. There is gross blood around the urethral meatus.   Musculoskeletal: He exhibits no edema.  Neurological: He is alert and oriented to person, place, and time.  Skin: Skin is warm and dry. He is not diaphoretic.  Psychiatric: His behavior is normal.  Nursing note and vitals reviewed.   ED Course  BLADDER CATHETERIZATION Date/Time: 04/25/2015 11:51 PM Performed by: Margarita Mail Authorized by: Margarita Mail Consent: Verbal consent obtained. Risks and benefits: risks, benefits and alternatives were discussed Consent given by: patient Patient identity confirmed: provided demographic data Time out: Immediately prior to procedure a "time out" was called to verify the correct patient, procedure, equipment, support staff and site/side marked as required. Indications: urinary retention Local anesthesia used: yes Local anesthetic: topical anesthetic Patient sedated: no Catheter insertion: indwelling Catheter size: 24 Fr Complicated insertion: no Altered anatomy: no Bladder irrigation: yes Number of attempts: 1 Urine volume: 500 ml Urine characteristics: bloody and blood-tinged Patient tolerance: Patient tolerated the procedure well with no immediate complications Comments: Patient with catheter obstruction secondary to large clots in the bladder. Catheter was changed and irrigated. Multiple large clots removed.   (including critical care time) Labs Review Labs Reviewed  URINALYSIS, ROUTINE W REFLEX MICROSCOPIC (NOT AT [facility]) - Abnormal; Notable for the following:    Color, Urine BROWN (*)    APPearance TURBID (*)    Hgb urine dipstick LARGE (*)    Protein, ur 100 (*)    Leukocytes, UA MODERATE (*)    All other  components within normal limits  BASIC METABOLIC PANEL - Abnormal; Notable for the following:    Glucose, Bld 126 (*)    BUN 23 (*)    Creatinine, Ser 1.32 (*)    GFR calc non Af Amer 44 (*)    GFR calc Af Amer 51 (*)    All other components within normal limits  URINE MICROSCOPIC-ADD ON - Abnormal; Notable for the following:    Squamous Epithelial / LPF 0-5 (*)    Bacteria, UA MANY (*)    All other components within normal limits  CBC WITH DIFFERENTIAL/PLATELET - Abnormal; Notable for the following:    RBC 3.42 (*)    Hemoglobin 10.7 (*)    HCT 33.1 (*)    All other components within normal limits  TSH - Abnormal; Notable for the following:    TSH 5.552 (*)    All other components within normal limits  COMPREHENSIVE METABOLIC PANEL - Abnormal; Notable for  the following:    BUN 21 (*)    Calcium 8.8 (*)    Albumin 2.8 (*)    GFR calc non Af Amer 52 (*)    GFR calc Af Amer 60 (*)    All other components within normal limits  CBC - Abnormal; Notable for the following:    RBC 3.37 (*)    Hemoglobin 10.7 (*)    HCT 33.0 (*)    All other components within normal limits  BASIC METABOLIC PANEL - Abnormal; Notable for the following:    Calcium 8.1 (*)    GFR calc non Af Amer 52 (*)    All other components within normal limits  CBC - Abnormal; Notable for the following:    RBC 3.33 (*)    Hemoglobin 10.7 (*)    HCT 33.0 (*)    All other components within normal limits  T3 - Abnormal; Notable for the following:    T3, Total 54 (*)    All other components within normal limits  BASIC METABOLIC PANEL - Abnormal; Notable for the following:    Glucose, Bld 176 (*)    Calcium 8.6 (*)    GFR calc non Af Amer 55 (*)    All other components within normal limits  CBC - Abnormal; Notable for the following:    RBC 3.51 (*)    Hemoglobin 10.9 (*)    HCT 33.7 (*)    All other components within normal limits  URINE CULTURE  MAGNESIUM  PHOSPHORUS  T4, FREE  GLUCOSE, CAPILLARY   GLUCOSE, CAPILLARY  CBC WITH DIFFERENTIAL/PLATELET  BASIC METABOLIC PANEL    Imaging Review No results found. I have personally reviewed and evaluated these images and lab results as part of my medical decision-making.   EKG Interpretation None      MDM   Final diagnoses:  Urinary tract infection associated with indwelling urethral catheter, initial encounter    Catheter associated UTI in a 81 y/o male. He had acute urinary retention which resolved with large catheter insertion and flushing .  i have begun IV rocephin Pain is poorly controlled he will be admitted for catheter associated urinary tract infection. Seen in shared visit with attending physician. He is afebrile, no leukocytosis, labs are otherwise reassuring.  Margarita Mail, PA-C 04/28/15 [place], MD 04/29/15 2211

## 2015-04-26 ENCOUNTER — Inpatient Hospital Stay (HOSPITAL_COMMUNITY): Payer: Medicare Other

## 2015-04-26 ENCOUNTER — Encounter (HOSPITAL_COMMUNITY): Payer: Self-pay | Admitting: Internal Medicine

## 2015-04-26 DIAGNOSIS — H9190 Unspecified hearing loss, unspecified ear: Secondary | ICD-10-CM | POA: Diagnosis present

## 2015-04-26 DIAGNOSIS — N179 Acute kidney failure, unspecified: Secondary | ICD-10-CM | POA: Diagnosis present

## 2015-04-26 DIAGNOSIS — I10 Essential (primary) hypertension: Secondary | ICD-10-CM | POA: Diagnosis not present

## 2015-04-26 DIAGNOSIS — R140 Abdominal distension (gaseous): Secondary | ICD-10-CM | POA: Diagnosis present

## 2015-04-26 DIAGNOSIS — R0902 Hypoxemia: Secondary | ICD-10-CM | POA: Diagnosis present

## 2015-04-26 DIAGNOSIS — N400 Enlarged prostate without lower urinary tract symptoms: Secondary | ICD-10-CM | POA: Diagnosis present

## 2015-04-26 DIAGNOSIS — N390 Urinary tract infection, site not specified: Secondary | ICD-10-CM | POA: Diagnosis present

## 2015-04-26 DIAGNOSIS — T83511A Infection and inflammatory reaction due to indwelling urethral catheter, initial encounter: Secondary | ICD-10-CM

## 2015-04-26 DIAGNOSIS — Z7982 Long term (current) use of aspirin: Secondary | ICD-10-CM | POA: Diagnosis not present

## 2015-04-26 DIAGNOSIS — I129 Hypertensive chronic kidney disease with stage 1 through stage 4 chronic kidney disease, or unspecified chronic kidney disease: Secondary | ICD-10-CM | POA: Diagnosis present

## 2015-04-26 DIAGNOSIS — I2510 Atherosclerotic heart disease of native coronary artery without angina pectoris: Secondary | ICD-10-CM | POA: Diagnosis present

## 2015-04-26 DIAGNOSIS — Y846 Urinary catheterization as the cause of abnormal reaction of the patient, or of later complication, without mention of misadventure at the time of the procedure: Secondary | ICD-10-CM | POA: Diagnosis present

## 2015-04-26 DIAGNOSIS — G9341 Metabolic encephalopathy: Secondary | ICD-10-CM | POA: Diagnosis present

## 2015-04-26 DIAGNOSIS — N183 Chronic kidney disease, stage 3 (moderate): Secondary | ICD-10-CM | POA: Diagnosis present

## 2015-04-26 DIAGNOSIS — I25810 Atherosclerosis of coronary artery bypass graft(s) without angina pectoris: Secondary | ICD-10-CM | POA: Diagnosis not present

## 2015-04-26 DIAGNOSIS — Z951 Presence of aortocoronary bypass graft: Secondary | ICD-10-CM | POA: Diagnosis not present

## 2015-04-26 DIAGNOSIS — G40909 Epilepsy, unspecified, not intractable, without status epilepticus: Secondary | ICD-10-CM | POA: Diagnosis present

## 2015-04-26 DIAGNOSIS — R310 Gross hematuria: Secondary | ICD-10-CM | POA: Diagnosis present

## 2015-04-26 DIAGNOSIS — I350 Nonrheumatic aortic (valve) stenosis: Secondary | ICD-10-CM | POA: Diagnosis present

## 2015-04-26 DIAGNOSIS — Z8673 Personal history of transient ischemic attack (TIA), and cerebral infarction without residual deficits: Secondary | ICD-10-CM | POA: Diagnosis not present

## 2015-04-26 DIAGNOSIS — E785 Hyperlipidemia, unspecified: Secondary | ICD-10-CM | POA: Diagnosis present

## 2015-04-26 DIAGNOSIS — R339 Retention of urine, unspecified: Secondary | ICD-10-CM | POA: Diagnosis present

## 2015-04-26 DIAGNOSIS — I471 Supraventricular tachycardia: Secondary | ICD-10-CM | POA: Diagnosis present

## 2015-04-26 DIAGNOSIS — G4733 Obstructive sleep apnea (adult) (pediatric): Secondary | ICD-10-CM | POA: Diagnosis present

## 2015-04-26 DIAGNOSIS — G9340 Encephalopathy, unspecified: Secondary | ICD-10-CM | POA: Diagnosis not present

## 2015-04-26 LAB — CBC
HEMATOCRIT: 33 % — AB (ref 39.0–52.0)
Hemoglobin: 10.7 g/dL — ABNORMAL LOW (ref 13.0–17.0)
MCH: 31.8 pg (ref 26.0–34.0)
MCHC: 32.4 g/dL (ref 30.0–36.0)
MCV: 97.9 fL (ref 78.0–100.0)
Platelets: 228 10*3/uL (ref 150–400)
RBC: 3.37 MIL/uL — ABNORMAL LOW (ref 4.22–5.81)
RDW: 14.2 % (ref 11.5–15.5)
WBC: 8.7 10*3/uL (ref 4.0–10.5)

## 2015-04-26 LAB — COMPREHENSIVE METABOLIC PANEL
ALT: 22 U/L (ref 17–63)
AST: 20 U/L (ref 15–41)
Albumin: 2.8 g/dL — ABNORMAL LOW (ref 3.5–5.0)
Alkaline Phosphatase: 56 U/L (ref 38–126)
Anion gap: 5 (ref 5–15)
BILIRUBIN TOTAL: 0.8 mg/dL (ref 0.3–1.2)
BUN: 21 mg/dL — AB (ref 6–20)
CHLORIDE: 109 mmol/L (ref 101–111)
CO2: 28 mmol/L (ref 22–32)
CREATININE: 1.15 mg/dL (ref 0.61–1.24)
Calcium: 8.8 mg/dL — ABNORMAL LOW (ref 8.9–10.3)
GFR, EST AFRICAN AMERICAN: 60 mL/min — AB (ref >60)
GFR, EST NON AFRICAN AMERICAN: 52 mL/min — AB (ref >60)
Glucose, Bld: 99 mg/dL (ref 65–99)
POTASSIUM: 4.1 mmol/L (ref 3.5–5.1)
Sodium: 142 mmol/L (ref 135–145)
TOTAL PROTEIN: 6.5 g/dL (ref 6.5–8.1)

## 2015-04-26 LAB — GLUCOSE, CAPILLARY: Glucose-Capillary: 68 mg/dL (ref 65–99)

## 2015-04-26 LAB — PHOSPHORUS: PHOSPHORUS: 4 mg/dL (ref 2.5–4.6)

## 2015-04-26 LAB — MAGNESIUM: Magnesium: 1.8 mg/dL (ref 1.7–2.4)

## 2015-04-26 LAB — TSH: TSH: 5.552 u[IU]/mL — AB (ref 0.350–4.500)

## 2015-04-26 MED ORDER — ONDANSETRON HCL 4 MG PO TABS: 4.0000 mg | ORAL_TABLET | Freq: Four times a day (QID) | ORAL | Status: DC | PRN

## 2015-04-26 MED ORDER — CEFTRIAXONE SODIUM 1 G IJ SOLR
1.0000 g | INTRAMUSCULAR | Status: DC
  Administered 2015-04-26 – 2015-04-28 (×3): 1 g via INTRAVENOUS
  Filled 2015-04-26 (×5): qty 10

## 2015-04-26 MED ORDER — LEVETIRACETAM 500 MG PO TABS
500.0000 mg | ORAL_TABLET | Freq: Two times a day (BID) | ORAL | Status: DC
  Administered 2015-04-26 – 2015-04-29 (×7): 500 mg via ORAL
  Filled 2015-04-26 (×8): qty 1

## 2015-04-26 MED ORDER — FINASTERIDE 5 MG PO TABS
5.0000 mg | ORAL_TABLET | Freq: Every day | ORAL | Status: DC
  Administered 2015-04-26 – 2015-04-29 (×4): 5 mg via ORAL
  Filled 2015-04-26 (×4): qty 1

## 2015-04-26 MED ORDER — SODIUM CHLORIDE 0.9 % IV SOLN
INTRAVENOUS | Status: AC
  Administered 2015-04-26: 03:00:00 via INTRAVENOUS

## 2015-04-26 MED ORDER — METOPROLOL SUCCINATE ER 25 MG PO TB24
25.0000 mg | ORAL_TABLET | Freq: Every day | ORAL | Status: DC
  Administered 2015-04-26 – 2015-04-29 (×3): 25 mg via ORAL
  Filled 2015-04-26 (×3): qty 1

## 2015-04-26 MED ORDER — TRAMADOL HCL 50 MG PO TABS
50.0000 mg | ORAL_TABLET | Freq: Three times a day (TID) | ORAL | Status: DC | PRN
  Administered 2015-04-27: 50 mg via ORAL
  Filled 2015-04-26: qty 1

## 2015-04-26 MED ORDER — MIRABEGRON ER 25 MG PO TB24
25.0000 mg | ORAL_TABLET | Freq: Every day | ORAL | Status: DC
  Administered 2015-04-26 – 2015-04-29 (×4): 25 mg via ORAL
  Filled 2015-04-26 (×4): qty 1

## 2015-04-26 MED ORDER — ASPIRIN EC 81 MG PO TBEC
81.0000 mg | DELAYED_RELEASE_TABLET | Freq: Every day | ORAL | Status: DC
Start: 2015-04-26 — End: 2015-04-29
  Administered 2015-04-26 – 2015-04-29 (×4): 81 mg via ORAL
  Filled 2015-04-26 (×4): qty 1

## 2015-04-26 MED ORDER — TRIAMCINOLONE ACETONIDE 0.1 % EX CREA
1.0000 | TOPICAL_CREAM | Freq: Two times a day (BID) | CUTANEOUS | Status: DC
Start: 2015-04-26 — End: 2015-04-29
  Administered 2015-04-26 – 2015-04-28 (×7): 1 via TOPICAL
  Filled 2015-04-26: qty 15

## 2015-04-26 MED ORDER — TAMSULOSIN HCL 0.4 MG PO CAPS
0.4000 mg | ORAL_CAPSULE | Freq: Every day | ORAL | Status: DC
  Administered 2015-04-27 – 2015-04-28 (×2): 0.4 mg via ORAL
  Filled 2015-04-26 (×3): qty 1

## 2015-04-26 MED ORDER — ONDANSETRON HCL 4 MG/2ML IJ SOLN
4.0000 mg | Freq: Four times a day (QID) | INTRAMUSCULAR | Status: DC | PRN
  Administered 2015-04-27: 4 mg via INTRAVENOUS
  Filled 2015-04-26: qty 2

## 2015-04-26 MED ORDER — DEXTROSE 50 % IV SOLN
25.0000 g | Freq: Once | INTRAVENOUS | Status: AC
  Administered 2015-04-26: 25 g via INTRAVENOUS

## 2015-04-26 MED ORDER — MIRTAZAPINE 15 MG PO TABS
7.5000 mg | ORAL_TABLET | Freq: Every day | ORAL | Status: DC
  Administered 2015-04-26 – 2015-04-28 (×3): 7.5 mg via ORAL
  Filled 2015-04-26 (×4): qty 1

## 2015-04-26 NOTE — H&P (Signed)
PCP:  Doristine Devoid, MD  Cardiology [facility] Referring provider Margarita Mail   Chief Complaint:   Pain in the urethra  HPI: Larry Bradford is a 81 y.o. male   has a past medical history of Seizures ([place]); Cataract; Hard of hearing; Renal disorder; Depression; HTN (hypertension); BPH (benign prostatic hyperplasia); CAD (coronary artery disease); Hyperlipidemia; OSA (obstructive sleep apnea); Aortic aneurysm ([place]); Carotid stenosis; DDD (degenerative disc disease); Trigeminal neuralgia; Lower GI bleed; Stroke [facility]); Allergy; and Aortic stenosis.   Patient has history of recurrent UTIs secondary to chronic indwelling Foley catheter he has been admitted in the past on a 6 urinary tract infection treated with IV ceftriaxone. The plan was for patient to follow-up with urology regarding the foley catheter. Patient has been residing at [facility] facility up until Saturday when he was released his been having some occasional diarrhea although mild and started to have blood and urine today. Family became concerned a washer for how long the catheter has been noticed that his abdomen becoming distended. On 23rd of November they brought him in to emerge department and was diagnosed with urethral irritation. Patient presented back today the acute urinary retention and inability to pass urine for past 12 hours. He's been reporting significant abdominal pain that had not had any fevers or chills. Patient has been having some bloody discharge from around meatus they have dilatated and secondary to chronic indwelling Foley. In emergency department a larger catheter was placed and flushed. Large  clot has passed and since then patient was able to produce urine was still complaining of abdominal discomfort. UA showing many bacteria as well as to numerous to count white blood cells and red blood cells. Diarrhea seems to have resolved currently abdominal pain improved. Noted  to be transiently hypoxic down to 84% on RA  Patient is being admitted for recurrent UTI   Hospitalist was called for admission for recurrent UTI bladder spasms  Review of Systems:    Pertinent positives include: Inability to urinate abdominal distention and pain, diarrhea, confusion  Constitutional:  No weight loss, night sweats, Fevers, chills, fatigue, weight loss  HEENT:  No headaches, Difficulty swallowing,Tooth/dental problems,Sore throat,  No sneezing, itching, ear ache, nasal congestion, post nasal drip,  Cardio-vascular:  No chest pain, Orthopnea, PND, anasarca, dizziness, palpitations.no Bilateral lower extremity swelling  GI:  No heartburn, indigestion,  , vomiting, change in bowel habits, loss of appetite, melena, blood in stool, hematemesis Resp:  no shortness of breath at rest. No dyspnea on exertion, No excess mucus, no productive cough, No non-productive cough, No coughing up of blood.No change in color of mucus.No wheezing. Skin:  no rash or lesions. No jaundice GU:  no dysuria, change in color of urine, no urgency or frequency. No straining to urinate.  No flank pain.  Musculoskeletal:  No joint pain or no joint swelling. No decreased range of motion. No back pain.  Psych:  No change in mood or affect. No depression or anxiety. No memory loss.  Neuro: no localizing neurological complaints, no tingling, no weakness, no double vision, no gait abnormality, no slurred speech, no confusion  Otherwise ROS are negative except for above, 10 systems were reviewed  Past Medical History: Past Medical History  Diagnosis Date  . Seizures ([place])     Onset in Sept 2006  . Cataract   . Hard of hearing   . Renal disorder   . Depression   . HTN (hypertension)   .  BPH (benign prostatic hyperplasia)   . CAD (coronary artery disease)     s/p CABG in 2002  . Hyperlipidemia   . OSA (obstructive sleep apnea)   . Aortic aneurysm ([place])     3 CM in 2007  . Carotid stenosis     . DDD (degenerative disc disease)   . Trigeminal neuralgia   . Lower GI bleed   . Stroke ([place])   . Allergy   . Aortic stenosis    Past Surgical History  Procedure Laterality Date  . Coronary artery bypass graft  2002  . Temporal artery biopsy / ligation  2004     Medications: Prior to Admission medications   Medication Sig Start Date End Date Taking? Authorizing Provider  aspirin EC 81 MG tablet Take 1 tablet (81 mg total) by mouth daily. 02/13/15  Yes Shanker Kristeen Mans, MD  finasteride (PROSCAR) 5 MG tablet Take 1 tablet (5 mg total) by mouth daily. 02/10/15  Yes Shanker Kristeen Mans, MD  levETIRAcetam (KEPPRA) 500 MG tablet Take 1 tablet (500 mg total) by mouth every 12 (twelve) hours. 01/21/15  Yes Comer Locket, PA-C  metoprolol succinate (TOPROL-XL) 25 MG 24 hr tablet Take 1 tablet (25 mg total) by mouth daily. 02/10/15  Yes Shanker Kristeen Mans, MD  mirabegron ER (MYRBETRIQ) 25 MG TB24 tablet Take 25 mg by mouth daily.   Yes Historical Provider, MD  mirtazapine (REMERON) 7.5 MG tablet Take 1 tablet (7.5 mg total) by mouth at bedtime as needed. Patient taking differently: Take 7.5 mg by mouth at bedtime.  04/11/15  Yes Lavina Hamman, MD  polyethylene glycol (MIRALAX / GLYCOLAX) packet Take 17 g by mouth daily. 04/11/15  Yes Lavina Hamman, MD  senna-docusate (SENOKOT-S) 8.6-50 MG tablet Take 2 tablets by mouth 2 (two) times daily. Patient taking differently: Take 1 tablet by mouth daily.  04/12/15  Yes Lavina Hamman, MD  tamsulosin (FLOMAX) 0.4 MG CAPS capsule Take 1 capsule (0.4 mg total) by mouth daily after supper. 02/10/15  Yes Shanker Kristeen Mans, MD  traMADol (ULTRAM) 50 MG tablet Take 50 mg by mouth every 8 (eight) hours as needed for moderate pain.   Yes Historical Provider, MD  triamcinolone cream (KENALOG) 0.1 % Apply 1 application topically 2 (two) times daily.   Yes Historical Provider, MD  feeding supplement, ENSURE ENLIVE, (ENSURE ENLIVE) LIQD Take 237 mLs by mouth 2 (two)  times daily between meals. Patient not taking: Reported on 04/25/2015 04/11/15   Lavina Hamman, MD    Allergies:   Allergies  Allergen Reactions  . Benadryl [Diphenhydramine Hcl]     "talks out of his head"  . Oxycodone     Feels crazy  . Tylenol [Acetaminophen]     unknown    Social History:  Ambulatory  [place]  Lives at home With family     reports that he has never smoked. He has never used smokeless tobacco. He reports that he does not drink alcohol or use illicit drugs.    Family History: family history includes CAD in his brother; Cancer in his mother; Heart disease in his brother.    Physical Exam: Patient Vitals for the past 24 hrs:  BP Temp Pulse Resp SpO2  04/25/15 2328 139/59 mmHg - 68 16 100 %  04/25/15 2127 133/56 mmHg - - - 100 %  04/25/15 2118 (!) 84/73 mmHg 97.6 F (36.4 C) 77 17 96 %    1. General:  in No Acute  distress 2. Psychological: Alert and   Oriented 3. Head/ENT:    Dry Mucous Membranes                          Head Non traumatic, neck supple                         Poor Dentition 4. SKIN:  decreased Skin turgor,  Skin clean Dry and intact no rash 5. Heart: Regular rate and rhythm no Murmur, Rub or gallop 6. Lungs:  no wheezes or crackles  Some coarse breath sounds 7. Abdomen: Soft, non-tender,  distended foley catheter present 8. Lower extremities: no clubbing, cyanosis, or edema 9. Neurologically Grossly intact, moving all 4 extremities equally 10. MSK: Normal range of motion  body mass index is unknown because there is no weight on file.   Labs on Admission:   Results for orders placed or performed during the hospital encounter of 04/25/15 (from the past 24 hour(s))  Urinalysis, Routine w reflex microscopic (not at [facility])     Status: Abnormal   Collection Time: 04/25/15 10:07 PM  Result Value Ref Range   Color, Urine BROWN (A) YELLOW   APPearance TURBID (A) CLEAR   Specific Gravity, Urine 1.022 1.005 - 1.030   pH 5.0 5.0 - 8.0    Glucose, UA NEGATIVE NEGATIVE mg/dL   Hgb urine dipstick LARGE (A) NEGATIVE   Bilirubin Urine NEGATIVE NEGATIVE   Ketones, ur NEGATIVE NEGATIVE mg/dL   Protein, ur 100 (A) NEGATIVE mg/dL   Nitrite NEGATIVE NEGATIVE   Leukocytes, UA MODERATE (A) NEGATIVE  Urine microscopic-add on     Status: Abnormal   Collection Time: 04/25/15 10:07 PM  Result Value Ref Range   Squamous Epithelial / LPF 0-5 (A) NONE SEEN   WBC, UA TOO NUMEROUS TO COUNT 0 - 5 WBC/hpf   RBC / HPF TOO NUMEROUS TO COUNT 0 - 5 RBC/hpf   Bacteria, UA MANY (A) NONE SEEN   Urine-Other URINALYSIS PERFORMED ON SUPERNATANT   Basic metabolic panel     Status: Abnormal   Collection Time: 04/25/15 10:27 PM  Result Value Ref Range   Sodium 142 135 - 145 mmol/L   Potassium 4.2 3.5 - 5.1 mmol/L   Chloride 109 101 - 111 mmol/L   CO2 27 22 - 32 mmol/L   Glucose, Bld 126 (H) 65 - 99 mg/dL   BUN 23 (H) 6 - 20 mg/dL   Creatinine, Ser 1.32 (H) 0.61 - 1.24 mg/dL   Calcium 9.4 8.9 - 10.3 mg/dL   GFR calc non Af Amer 44 (L) >60 mL/min   GFR calc Af Amer 51 (L) >60 mL/min   Anion gap 6 5 - 15  CBC with Differential/Platelet     Status: Abnormal   Collection Time: 04/25/15 11:02 PM  Result Value Ref Range   WBC 9.8 4.0 - 10.5 K/uL   RBC 3.42 (L) 4.22 - 5.81 MIL/uL   Hemoglobin 10.7 (L) 13.0 - 17.0 g/dL   HCT 33.1 (L) 39.0 - 52.0 %   MCV 96.8 78.0 - 100.0 fL   MCH 31.3 26.0 - 34.0 pg   MCHC 32.3 30.0 - 36.0 g/dL   RDW 14.2 11.5 - 15.5 %   Platelets 228 150 - 400 K/uL   Neutrophils Relative % 65 %   Neutro Abs 6.4 1.7 - 7.7 K/uL   Lymphocytes Relative 22 %   Lymphs Abs 2.1  0.7 - 4.0 K/uL   Monocytes Relative 8 %   Monocytes Absolute 0.8 0.1 - 1.0 K/uL   Eosinophils Relative 5 %   Eosinophils Absolute 0.5 0.0 - 0.7 K/uL   Basophils Relative 0 %   Basophils Absolute 0.0 0.0 - 0.1 K/uL    UA with blood cell, red blood cell to numerous to count,  many bacteria  Lab Results  Component Value Date   HGBA1C 5.6 12/21/2012      Estimated Creatinine Clearance: 32.7 mL/min (by C-G formula based on Cr of 1.32).  BNP (last 3 results) No results for input(s): PROBNP in the last 8760 hours.  Other results:  I have pearsonaly reviewed this: ECG REPORT not obtained   There were no vitals filed for this visit.   Cultures:    Component Value Date/Time   SDES URINE, CATHETERIZED 04/23/2015 2214   SPECREQUEST NONE 04/23/2015 2214   CULT  04/23/2015 2214    NO GROWTH 2 DAYS Performed at [place] 04/25/2015 FINAL 04/23/2015 2214     Radiological Exams on Admission: No results found.  Chart has been reviewed  Family  Not at  Bedside    Assessment/Plan  81 year old gentleman with history of recurrent UTIs.  With urinary retention secondary to clot formation in the bladder currently is in good urine flow will admit for IV antibiotics and monitor urine output will benefit from urology input in the morning  Present on Admission:  . UTI (lower urinary tract infection), secondary to chronic indwelling Foley catheter - cover with rocephin for now, urine culture pending URinary retention likely due to clot formation will continue to irrigate foley would benefit from urology consult in AM . Hypertension - continue home medications . Coronary artery disease involving coronary bypass graft of native heart without angina pectoris - stable, chest pain free  . CKD (chronic kidney disease), stage III - monitor cr, given urinary retention . Acute encephalopathy - mild confusion now resolved  abdominal distention-  will obtain KUB to evaluate for stool burden and or ileus  diarrhea seem to have resolved, hold off laxative for now and resume as needed Hypoxia - will obtain CXR   Prophylaxis: SCD   CODE STATUS:  Presumed FULL CODE  Will need to be readdressed in AM  Disposition:   likely will need placement for rehabilitation                          Other plan as per orders.  I have  spent a total of 55 min on this admission  Tyra Michelle 04/26/2015, 12:49 AM  [facility]  Pager [phone]   after 2 AM please page floor coverage PA If 7AM-7PM, please contact the day team taking care of the patient  [email or web address]  Password [email or web address]

## 2015-04-26 NOTE — Progress Notes (Signed)
[facility] PROGRESS NOTE   Larry Bradford [id number] DOB: 09/01/1916 DOA: 04/25/2015 PCP: Doristine Devoid, MD  HPI/Subjective: Patient is very hard of hearing, no complaints.  Assessment/Plan: Active Problems:   Hypertension   Seizures (HCC)   CKD (chronic kidney disease), stage III   Acute encephalopathy   Atrial tachycardia (H[facility])   Coronary artery disease involving coronary bypass graft of native heart without angina pectoris   UTI (lower urinary tract infection), secondary to chronic indwelling Foley catheter   UTI (urinary tract infection) due to urinary indwelling catheter (H[facility])   Abdominal distention   Patient admitted earlier today by my colleague Dr. Roel Cluck, this is a no charge note. Patient seen and examined, data base reviewed. Admitted with urinary retention secondary to clot formation in the tip of indwelling Foley catheter. Patient has indwelling Foley catheter, has gross hematuria.  Code Status: Full Code Family Communication: Plan discussed with the patient. Disposition Plan: Remains inpatient Diet: Diet Heart Room service appropriate?: Yes; Fluid consistency:: Thin  Consultants:  None  Procedures:  None  Antibiotics:  Ceftriaxone 04/26/2015 (indicate start date, and stop date if known)   Objective: Filed Vitals:   04/26/15 0142 04/26/15 0545  BP: 121/76 115/44  Pulse: 85 78  Temp: 98.4 F (36.9 C) 97.4 F (36.3 C)  Resp: 16 17    Intake/Output Summary (Last 24 hours) at 04/26/15 1221 Last data filed at 04/26/15 0700  Gross per 24 hour  Intake    150 ml  Output    850 ml  Net   -700 ml   There were no vitals filed for this visit.  Exam: General: Alert and awake, oriented x3, not in any acute distress. HEENT: anicteric sclera, pupils reactive to light and accommodation, EOMI CVS: S1-S2 clear, no murmur rubs or gallops Chest: clear to auscultation bilaterally, no wheezing, rales or rhonchi Abdomen: soft nontender,  nondistended, normal bowel sounds, no organomegaly Extremities: no cyanosis, clubbing or edema noted bilaterally Neuro: Cranial nerves II-XII intact, no focal neurological deficits  Data Reviewed: Basic Metabolic Panel:  Recent Labs Lab 04/23/15 2153 04/25/15 2227 04/26/15 0404  NA 139 142 142  K 4.7 4.2 4.1  CL 108 109 109  CO2 27 27 28   GLUCOSE 138* 126* 99  BUN 27* 23* 21*  CREATININE 1.32* 1.32* 1.15  CALCIUM 9.2 9.4 8.8*  MG  --   --  1.8  PHOS  --   --  4.0   Liver Function Tests:  Recent Labs Lab 04/26/15 0404  AST 20  ALT 22  ALKPHOS 56  BILITOT 0.8  PROT 6.5  ALBUMIN 2.8*   No results for input(s): LIPASE, AMYLASE in the last 168 hours. No results for input(s): AMMONIA in the last 168 hours. CBC:  Recent Labs Lab 04/23/15 2153 04/25/15 2302 04/26/15 0404  WBC 9.1 9.8 8.7  NEUTROABS 6.2 6.4  --   HGB 12.0* 10.7* 10.7*  HCT 36.8* 33.1* 33.0*  MCV 96.6 96.8 97.9  PLT 254 228 228   Cardiac Enzymes: No results for input(s): CKTOTAL, CKMB, CKMBINDEX, TROPONINI in the last 168 hours. BNP (last 3 results)  Recent Labs  02/07/15 2154  BNP 48.2    ProBNP (last 3 results) No results for input(s): PROBNP in the last 8760 hours.  CBG: No results for input(s): GLUCAP in the last 168 hours.  Micro Recent Results (from the past 240 hour(s))  Urine culture     Status: None   Collection Time: 04/23/15 10:14  PM  Result Value Ref Range Status   Specimen Description URINE, CATHETERIZED  Final   Special Requests NONE  Final   Culture   Final    NO GROWTH 2 DAYS Performed at [facility]    Report Status 04/25/2015 FINAL  Final     Studies: Dg Chest 2 View  04/26/2015  CLINICAL DATA:  Abdominal distention EXAM: CHEST  2 VIEW COMPARISON:  04/06/2015 chest radiograph. FINDINGS: Median sternotomy wires are aligned and intact. Coronary artery stent overlies the left heart. CABG clips are seen throughout the mediastinum. Low lung volumes. Stable  cardiomediastinal silhouette with mild cardiomegaly. No pneumothorax. Increased small left pleural effusion. No right pleural effusion. No overt pulmonary edema. Patchy opacities in both lower lobes, slightly increased. IMPRESSION: 1. Stable mild cardiomegaly without overt pulmonary edema. 2. Increased small left pleural effusion. 3. Low lung volumes. Mild patchy bibasilar lung opacities, slightly increased, favor atelectasis. Electronically Signed   By: Ilona Sorrel M.D.   On: 04/26/2015 09:58   Dg Abd 1 View  04/26/2015  CLINICAL DATA:  Abdominal distention EXAM: ABDOMEN - 1 VIEW COMPARISON:  04/11/2015 abdominal radiograph FINDINGS: No disproportionately dilated small bowel loops. Mild to moderate stool throughout the visualized colon. No evidence of pneumatosis or pneumoperitoneum. Mild degenerative changes in the lumbar spine. No pathologic soft tissue calcifications. IMPRESSION: Nonobstructive bowel gas pattern.  Mild-to-moderate colonic stool. Electronically Signed   By: Ilona Sorrel M.D.   On: 04/26/2015 09:51    Scheduled Meds: . aspirin EC  81 mg Oral Daily  . cefTRIAXone (ROCEPHIN) IVPB 1 gram/50 mL D5W  1 g Intravenous Q24H  . finasteride  5 mg Oral Daily  . levETIRAcetam  500 mg Oral Q12H  . lidocaine  1 application Urethral Once  . metoprolol succinate  25 mg Oral Daily  . mirabegron ER  25 mg Oral Daily  . mirtazapine  7.5 mg Oral QHS  . tamsulosin  0.4 mg Oral QPC supper  . triamcinolone cream  1 application Topical BID   Continuous Infusions:      Time spent: 35 minutes    [facility]  [facility] Pager [phone] If 7PM-7AM, please contact night-coverage at [email or web address], password [email or web address] 04/26/2015, 12:21 PM  LOS: 0 days

## 2015-04-27 DIAGNOSIS — I25810 Atherosclerosis of coronary artery bypass graft(s) without angina pectoris: Secondary | ICD-10-CM

## 2015-04-27 DIAGNOSIS — N390 Urinary tract infection, site not specified: Secondary | ICD-10-CM

## 2015-04-27 DIAGNOSIS — G9340 Encephalopathy, unspecified: Secondary | ICD-10-CM

## 2015-04-27 DIAGNOSIS — R140 Abdominal distension (gaseous): Secondary | ICD-10-CM

## 2015-04-27 DIAGNOSIS — R569 Unspecified convulsions: Secondary | ICD-10-CM

## 2015-04-27 DIAGNOSIS — I10 Essential (primary) hypertension: Secondary | ICD-10-CM

## 2015-04-27 LAB — URINE CULTURE
Culture: NO GROWTH
Special Requests: NORMAL

## 2015-04-27 LAB — CBC
HCT: 33 % — ABNORMAL LOW (ref 39.0–52.0)
Hemoglobin: 10.7 g/dL — ABNORMAL LOW (ref 13.0–17.0)
MCH: 32.1 pg (ref 26.0–34.0)
MCHC: 32.4 g/dL (ref 30.0–36.0)
MCV: 99.1 fL (ref 78.0–100.0)
PLATELETS: 198 10*3/uL (ref 150–400)
RBC: 3.33 MIL/uL — AB (ref 4.22–5.81)
RDW: 14.2 % (ref 11.5–15.5)
WBC: 8.3 10*3/uL (ref 4.0–10.5)

## 2015-04-27 LAB — BASIC METABOLIC PANEL
Anion gap: 7 (ref 5–15)
BUN: 19 mg/dL (ref 6–20)
CALCIUM: 8.1 mg/dL — AB (ref 8.9–10.3)
CHLORIDE: 107 mmol/L (ref 101–111)
CO2: 22 mmol/L (ref 22–32)
CREATININE: 1.14 mg/dL (ref 0.61–1.24)
GFR, EST NON AFRICAN AMERICAN: 52 mL/min — AB (ref >60)
Glucose, Bld: 86 mg/dL (ref 65–99)
Potassium: 4.1 mmol/L (ref 3.5–5.1)
SODIUM: 136 mmol/L (ref 135–145)

## 2015-04-27 LAB — GLUCOSE, CAPILLARY: Glucose-Capillary: 99 mg/dL (ref 65–99)

## 2015-04-27 LAB — T4, FREE: FREE T4: 0.64 ng/dL (ref 0.61–1.12)

## 2015-04-27 MED ORDER — CETYLPYRIDINIUM CHLORIDE 0.05 % MT LIQD
7.0000 mL | Freq: Two times a day (BID) | OROMUCOSAL | Status: DC
  Administered 2015-04-28 (×2): 7 mL via OROMUCOSAL

## 2015-04-27 MED ORDER — MAGNESIUM HYDROXIDE 400 MG/5ML PO SUSP
30.0000 mL | Freq: Once | ORAL | Status: AC
  Administered 2015-04-27: 30 mL via ORAL
  Filled 2015-04-27: qty 30

## 2015-04-27 MED ORDER — POLYETHYLENE GLYCOL 3350 17 G PO PACK
17.0000 g | PACK | Freq: Every day | ORAL | Status: DC
  Administered 2015-04-28: 17 g via ORAL
  Filled 2015-04-27 (×3): qty 1

## 2015-04-27 MED ORDER — HYDROMORPHONE HCL 1 MG/ML IJ SOLN
0.5000 mg | INTRAMUSCULAR | Status: DC | PRN
  Administered 2015-04-27: 0.5 mg via INTRAVENOUS
  Filled 2015-04-27: qty 1

## 2015-04-27 MED ORDER — CHLORHEXIDINE GLUCONATE 0.12 % MT SOLN
15.0000 mL | Freq: Two times a day (BID) | OROMUCOSAL | Status: DC
  Administered 2015-04-27 – 2015-04-29 (×3): 15 mL via OROMUCOSAL
  Filled 2015-04-27 (×3): qty 15

## 2015-04-27 MED ORDER — DEXTROSE-NACL 5-0.9 % IV SOLN
INTRAVENOUS | Status: DC
  Filled 2015-04-27: qty 1000

## 2015-04-27 MED ORDER — DEXTROSE-NACL 5-0.9 % IV SOLN
INTRAVENOUS | Status: DC
  Administered 2015-04-27 (×2): via INTRAVENOUS
  Administered 2015-04-28: 1000 mL via INTRAVENOUS

## 2015-04-27 NOTE — Progress Notes (Signed)
This RN called to room at 1825, patient having seizure witnessed by family who called staff. Patient turned by staff to his left side and vital signs were being taken. 1840- Patient starting to talk to family and able to recognize them . Dr Hartford Poli notified at 1845 of event. Will continue to monitor.

## 2015-04-27 NOTE — Progress Notes (Signed)
MD, it was reported to me that patient had been very lethargic and sleepy during the day shift (unable to take medications and nurse had to sternal rub to wake up patient).  During night shift, patient continued to be somnolent/very lethargic and not waking up with even a sternal rub.  Patient's wife was called to assess baseline.  She reported that this is definitely not his normal behavior (he is normally pretty independent and alert).  She did say this has happened (this level of confusion) once before when he was in the hospital.  MD and Rapid Response RN were both called regarding patient.  Rapid response RN came to help assess the patient.  During workup we found that patient had a CBG of 68.  He was given dextrose 25 g and instantly he woke up and was irritable/yelling/fighting.  He now talks in sentences but does not follow commands well and is still very lethargic/wanting to sleep.  He will now wake up with minimal stimulation.  He also would not take any of his medications d/t confusion and lethargy.  MD was informed of condition and vitals.  BP and pulse slightly on the lower end but stable, MD aware.  Will continue to monitor patient overnight. Azzie Glatter [place]

## 2015-04-27 NOTE — Progress Notes (Signed)
Assessed patient due to lethargy. He was admitted with UTI and has been getting more lethargic through the day and night and unable to wake and follow commands. BP 111/60-117/50, heart rate 48-58 SB, sats 95% on room air. Pt will moan to sternal rub. Pupils 3 and reactive. CBG = 68 --56ml of D50 given and patient became agitated, yelling to remove BP cuff and moving all extremities. He was unable to console. Heart rate up to 70's SR. NP was notified of events.Gradually he was more relaxed and allowed to rest.

## 2015-04-27 NOTE — Progress Notes (Signed)
[facility] PROGRESS NOTE   Larry Bradford [id number] DOB: 01/19/1917 DOA: 04/25/2015 PCP: Doristine Devoid, MD  HPI/Subjective: Patient is very hard of hearing, was sleepy yesterday, easy to arouse. No Fever or chills.  Assessment/Plan: Active Problems:   Hypertension   Seizures (HCC)   CKD (chronic kidney disease), stage III   Acute encephalopathy   Atrial tachycardia (HCC)   Coronary artery disease involving coronary bypass graft of native heart without angina pectoris   UTI (lower urinary tract infection), secondary to chronic indwelling Foley catheter   UTI (urinary tract infection) due to urinary indwelling catheter (HCC)   Abdominal distention    UTI Complicated UTI, because of chronic indwelling Foley catheter. Urinalysis consistent with UTI, started on Rocephin, continue. Just antibiotics according to the culture results.  Acute metabolic encephalopathy Patient was lethargic and somnolent yesterday which is likely secondary to the UTI. No sedating medications, avoid narcotics and benzos, follow clinically.  Abdominal distention Patient abdomen is distended and mild diffuse tenderness. Abdominal x-ray showed normal bowel pattern. There is moderate stool burden. We'll give milk of magnesia and MiraLAX.  Acute renal failure Baseline creatinine is 1.1, patient presented with creatinine of 1.3. This is improved after IV fluid hydration to 1.1 again.  History of seizure disorder On Keppra, continue.  Hypertension Chronic condition, stable.  Code Status: Full Code Family Communication: Plan discussed with the patient. Disposition Plan: Remains inpatient Diet: Diet Heart Room service appropriate?: Yes; Fluid consistency:: Thin  Consultants:  None  Procedures:  None  Antibiotics:  Ceftriaxone 04/26/2015 (indicate start date, and stop date if known)   Objective: Filed Vitals:   04/27/15 0539 04/27/15 0620  BP: 109/35 104/50  Pulse:  54   Temp: 98.5 F (36.9 C)   Resp: 14     Intake/Output Summary (Last 24 hours) at 04/27/15 1007 Last data filed at 04/27/15 [id number]  Gross per 24 hour  Intake    170 ml  Output    200 ml  Net    -30 ml   Filed Weights   04/26/15 1500  Weight: 73.029 kg (161 lb)    Exam: General: Alert and awake, oriented x3, not in any acute distress. HEENT: anicteric sclera, pupils reactive to light and accommodation, EOMI CVS: S1-S2 clear, no murmur rubs or gallops Chest: clear to auscultation bilaterally, no wheezing, rales or rhonchi Abdomen: soft nontender, nondistended, normal bowel sounds, no organomegaly Extremities: no cyanosis, clubbing or edema noted bilaterally Neuro: Cranial nerves II-XII intact, no focal neurological deficits  Data Reviewed: Basic Metabolic Panel:  Recent Labs Lab 04/23/15 2153 04/25/15 2227 04/26/15 0404 04/27/15 0400  NA 139 142 142 136  K 4.7 4.2 4.1 4.1  CL 108 109 109 107  CO2 27 27 28 22   GLUCOSE 138* 126* 99 86  BUN 27* 23* 21* 19  CREATININE 1.32* 1.32* 1.15 1.14  CALCIUM 9.2 9.4 8.8* 8.1*  MG  --   --  1.8  --   PHOS  --   --  4.0  --    Liver Function Tests:  Recent Labs Lab 04/26/15 0404  AST 20  ALT 22  ALKPHOS 56  BILITOT 0.8  PROT 6.5  ALBUMIN 2.8*   No results for input(s): LIPASE, AMYLASE in the last 168 hours. No results for input(s): AMMONIA in the last 168 hours. CBC:  Recent Labs Lab 04/23/15 2153 04/25/15 2302 04/26/15 0404 04/27/15 0400  WBC 9.1 9.8 8.7 8.3  NEUTROABS 6.2 6.4  --   --  HGB 12.0* 10.7* 10.7* 10.7*  HCT 36.8* 33.1* 33.0* 33.0*  MCV 96.6 96.8 97.9 99.1  PLT 254 228 228 198   Cardiac Enzymes: No results for input(s): CKTOTAL, CKMB, CKMBINDEX, TROPONINI in the last 168 hours. BNP (last 3 results)  Recent Labs  02/07/15 2154  BNP 48.2    ProBNP (last 3 results) No results for input(s): PROBNP in the last 8760 hours.  CBG:  Recent Labs Lab 04/26/15 2338 04/27/15 0001  GLUCAP 68  99    Micro Recent Results (from the past 240 hour(s))  Urine culture     Status: None   Collection Time: 04/23/15 10:14 PM  Result Value Ref Range Status   Specimen Description URINE, CATHETERIZED  Final   Special Requests NONE  Final   Culture   Final    NO GROWTH 2 DAYS Performed at [facility]    Report Status 04/25/2015 FINAL  Final     Studies: Dg Chest 2 View  04/26/2015  CLINICAL DATA:  Abdominal distention EXAM: CHEST  2 VIEW COMPARISON:  04/06/2015 chest radiograph. FINDINGS: Median sternotomy wires are aligned and intact. Coronary artery stent overlies the left heart. CABG clips are seen throughout the mediastinum. Low lung volumes. Stable cardiomediastinal silhouette with mild cardiomegaly. No pneumothorax. Increased small left pleural effusion. No right pleural effusion. No overt pulmonary edema. Patchy opacities in both lower lobes, slightly increased. IMPRESSION: 1. Stable mild cardiomegaly without overt pulmonary edema. 2. Increased small left pleural effusion. 3. Low lung volumes. Mild patchy bibasilar lung opacities, slightly increased, favor atelectasis. Electronically Signed   By: Ilona Sorrel M.D.   On: 04/26/2015 09:58   Dg Abd 1 View  04/26/2015  CLINICAL DATA:  Abdominal distention EXAM: ABDOMEN - 1 VIEW COMPARISON:  04/11/2015 abdominal radiograph FINDINGS: No disproportionately dilated small bowel loops. Mild to moderate stool throughout the visualized colon. No evidence of pneumatosis or pneumoperitoneum. Mild degenerative changes in the lumbar spine. No pathologic soft tissue calcifications. IMPRESSION: Nonobstructive bowel gas pattern.  Mild-to-moderate colonic stool. Electronically Signed   By: Ilona Sorrel M.D.   On: 04/26/2015 09:51    Scheduled Meds: . antiseptic oral rinse  7 mL Mouth Rinse q12n4p  . aspirin EC  81 mg Oral Daily  . cefTRIAXone (ROCEPHIN) IVPB 1 gram/50 mL D5W  1 g Intravenous Q24H  . chlorhexidine  15 mL Mouth Rinse BID  .  finasteride  5 mg Oral Daily  . levETIRAcetam  500 mg Oral Q12H  . lidocaine  1 application Urethral Once  . metoprolol succinate  25 mg Oral Daily  . mirabegron ER  25 mg Oral Daily  . mirtazapine  7.5 mg Oral QHS  . tamsulosin  0.4 mg Oral QPC supper  . triamcinolone cream  1 application Topical BID   Continuous Infusions: . dextrose 5 % and 0.9% NaCl         Time spent: 35 minutes    [facility]  [facility] Pager [phone] If 7PM-7AM, please contact night-coverage at [email or web address], password [facility] 04/27/2015, 10:07 AM  LOS: 1 day

## 2015-04-27 NOTE — Progress Notes (Signed)
Hypoglycemic Event  CBG: 68 (at 2338 pm on 11/26)  Treatment:25 cc IV dextrose  Symptoms: Confusion, lethargic, minimally responsive  Follow-up CBG: Time:0001 on 11/27 CBG Result:99  Possible Reasons for Event: unknown  Comments/MD notified:Yes, MD was called about low CBG and confusion; Rapid Response RN was also notified.    Azzie Glatter [place]

## 2015-04-27 NOTE — Progress Notes (Signed)
Patients foley catheter flushed with sterile NS, but unable to get return. Charge nurse from 4th floor called to assist with foley. Sonia Baller RN cleaned catheter with betadine and advanced, re inflated balloon with 15 cc sterile NS and was able to flush catheter with sterile NS and get return which contalined bleed clots, 500 cc bloody urine was returned. Will continue to monitor.

## 2015-04-27 NOTE — Progress Notes (Signed)
Patient unable to answer admission history questions and no family present to answer these questions.  Will have to wait until family arrives.  Will notify day shift RN also. Azzie Glatter [place]

## 2015-04-28 DIAGNOSIS — T83511A Infection and inflammatory reaction due to indwelling urethral catheter, initial encounter: Principal | ICD-10-CM

## 2015-04-28 DIAGNOSIS — I471 Supraventricular tachycardia: Secondary | ICD-10-CM

## 2015-04-28 DIAGNOSIS — N183 Chronic kidney disease, stage 3 (moderate): Secondary | ICD-10-CM

## 2015-04-28 LAB — CBC
HCT: 33.7 % — ABNORMAL LOW (ref 39.0–52.0)
HEMOGLOBIN: 10.9 g/dL — AB (ref 13.0–17.0)
MCH: 31.1 pg (ref 26.0–34.0)
MCHC: 32.3 g/dL (ref 30.0–36.0)
MCV: 96 fL (ref 78.0–100.0)
Platelets: 210 10*3/uL (ref 150–400)
RBC: 3.51 MIL/uL — AB (ref 4.22–5.81)
RDW: 13.7 % (ref 11.5–15.5)
WBC: 9.2 10*3/uL (ref 4.0–10.5)

## 2015-04-28 LAB — BASIC METABOLIC PANEL
ANION GAP: 5 (ref 5–15)
BUN: 17 mg/dL (ref 6–20)
CALCIUM: 8.6 mg/dL — AB (ref 8.9–10.3)
CHLORIDE: 110 mmol/L (ref 101–111)
CO2: 27 mmol/L (ref 22–32)
Creatinine, Ser: 1.09 mg/dL (ref 0.61–1.24)
GFR calc non Af Amer: 55 mL/min — ABNORMAL LOW (ref >60)
Glucose, Bld: 176 mg/dL — ABNORMAL HIGH (ref 65–99)
Potassium: 3.8 mmol/L (ref 3.5–5.1)
Sodium: 142 mmol/L (ref 135–145)

## 2015-04-28 LAB — T3: T3, Total: 54 ng/dL — ABNORMAL LOW (ref 71–180)

## 2015-04-28 MED ORDER — MAGNESIUM CITRATE PO SOLN
1.0000 | Freq: Once | ORAL | Status: AC
  Administered 2015-04-28: 1 via ORAL
  Filled 2015-04-28: qty 296

## 2015-04-28 NOTE — Progress Notes (Addendum)
Shift progressed uneventfully. Patient slept well overnight and denied any pain. Vital signs remained stable. Manual bladder irrigation was performed a total three times this past shift, with immediate return or clear to light yellow urinary output. Procedure was well tolerated by patient. Patient remains on continuous, supplemental oxygen therapy via nasal cannula, set at 4 liters/minute. Pulse oximetry readings were predominantly above > 92%. There was no evidence of nocturnal desaturations to < 88 % occurring while patient was asleep.

## 2015-04-28 NOTE — Plan of Care (Signed)
Problem: Safety: Goal: Ability to remain free from injury will improve Outcome: Progressing Patient attempted to get out of bed, patient on bed alarm,low bed also provided for safety measures.  Patient encourged to call for assist by using his call button,patient verbalized understanding.

## 2015-04-28 NOTE — Evaluation (Signed)
Physical Therapy Evaluation Patient Details Name: Larry Bradford MRN: [id number] DOB: 03-27-1917 Today's Date: 04/28/2015   History of Present Illness  Pt is a 81 year old male with past medical history of Seizures; Cataract; Hard of hearing; Renal disorder; Depression; HTN; BPH (benign prostatic hyperplasia); CAD (coronary artery disease); Hyperlipidemia; OSA (obstructive sleep apnea); Aortic aneurysm; Carotid stenosis; DDD (degenerative disc disease); Trigeminal neuralgia; Lower GI bleed; Stroke; Allergy;  and admitted for acute metabolic encephalopathy and UTI  Clinical Impression  Pt admitted with above diagnosis. Pt currently with functional limitations due to the deficits listed below (see PT Problem List).  Pt will benefit from skilled PT to increase their independence and safety with mobility to allow discharge to the venue listed below.  Pt with recent admission and discharged to SNF.  No family present and pt very HOH and appears slightly confused today.  Uncertain of assist available at home however pt currently requiring at least mod assist for mobility and correcting LOB, so would benefit from return to SNF for rehab if family is unable to assist.     Follow Up Recommendations SNF;Supervision/Assistance - 24 hour    Equipment Recommendations  Rolling walker with 5" wheels    Recommendations for Other Services       Precautions / Restrictions Precautions Precautions: Fall      Mobility  Bed Mobility Overal bed mobility: Needs Assistance Bed Mobility: Supine to Sit;Sit to Supine     Supine to sit: Min guard Sit to supine: Mod assist   General bed mobility comments: guard hands support provided upright  Transfers Overall transfer level: Needs assistance Equipment used: Rolling walker (2 wheeled) Transfers: Sit to/from Stand Sit to Stand: Mod assist;+2 safety/equipment         General transfer comment: assist to rise and mod assist for steadying due to  posteior LOB  Ambulation/Gait Ambulation/Gait assistance: Min assist;+2 safety/equipment Ambulation Distance (Feet): 40 Feet Assistive device: Rolling walker (2 wheeled) Gait Pattern/deviations: Step-through pattern;Decreased stride length;Trunk flexed Gait velocity: decreased    General Gait Details: poor motor control observed, pt required max cues for keeping RW closer (maintains too far forward) and min assist for steadying  Stairs            Wheelchair Mobility    Modified Rankin (Stroke Patients Only)       Balance Overall balance assessment: Needs assistance         Standing balance support: Bilateral upper extremity supported Standing balance-Leahy Scale: Poor                               Pertinent Vitals/Pain Pain Assessment: No/denies pain    Home Living Family/patient expects to be discharged to:: Skilled nursing facility                 Additional Comments: wife not present, pt is not an accurate historian at this time    Prior Function                 Hand Dominance        Extremity/Trunk Assessment   Upper Extremity Assessment: Generalized weakness           Lower Extremity Assessment: Generalized weakness      Cervical / Trunk Assessment: Kyphotic (pt maintained kyphotic posture throughout session despite posture cues)  Communication   Communication: HOH  Cognition Arousal/Alertness: Awake/alert Behavior During Therapy: WFL for tasks assessed/performed Overall Cognitive  Status: No family/caregiver present to determine baseline cognitive functioning Area of Impairment: Orientation;Safety/judgement;Following commands;Problem solving Orientation Level: Disoriented to;Situation;Place;Time     Following Commands: Follows one step commands with increased time     Problem Solving: Slow processing;Difficulty sequencing;Requires verbal cues;Requires tactile cues General Comments: hears better with Right ear,  able to follow simple commands, seemed to become more anxious upon return to bed (he also verbalized this) requiring more assist and cues for safety    General Comments      Exercises        Assessment/Plan    PT Assessment Patient needs continued PT services  PT Diagnosis Difficulty walking;Generalized weakness   PT Problem List Decreased strength;Decreased activity tolerance;Decreased balance;Decreased mobility;Decreased safety awareness;Decreased cognition  PT Treatment Interventions DME instruction;Gait training;Functional mobility training;Therapeutic activities;Patient/family education;Therapeutic exercise;Balance training   PT Goals (Current goals can be found in the Care Plan section) Acute Rehab PT Goals PT Goal Formulation: Patient unable to participate in goal setting Time For Goal Achievement: 05/12/15 Potential to Achieve Goals: Fair    Frequency Min 3X/week   Barriers to discharge        Co-evaluation               End of Session Equipment Utilized During Treatment: Gait belt Activity Tolerance: Patient limited by fatigue Patient left: in bed;with call bell/phone within reach;with bed alarm set Nurse Communication: Mobility status (observed pt ambulating in hallway)         Time: 1458-1510 PT Time Calculation (min) (ACUTE ONLY): 12 min   Charges:   PT Evaluation $Initial PT Evaluation Tier I: 1 Procedure     PT G Codes:        Pranit Owensby,KATHrine E 04/28/2015, 4:02 PM Carmelia Bake, PT, DPT 04/28/2015 Pager: [phone]

## 2015-04-28 NOTE — Clinical Social Work Placement (Signed)
   CLINICAL SOCIAL WORK PLACEMENT  NOTE  Date:  04/28/2015  Patient Details  Name: Larry Bradford MRN: [id number] Date of Birth: [birth year]  Clinical Social Work is seeking post-discharge placement for this patient at the [place] level of care (*CSW will initial, date and re-position this form in  chart as items are completed):  Yes   Patient/family provided with [facility] Work [facility]'s list of facilities offering this level of care within the geographic area requested by the patient (or if unable, by the patient's family).  Yes   Patient/family informed of their freedom to choose among providers that offer the needed level of care, that participate in Medicare, Medicaid or managed care program needed by the patient, have an available bed and are willing to accept the patient.  Yes   Patient/family informed of 's ownership interest in [facility] and [facility], as well as of the fact that they are under no obligation to receive care at these facilities.  PASRR submitted to EDS on       PASRR number received on       Existing PASRR number confirmed on 04/28/15     FL2 transmitted to all facilities in geographic area requested by pt/family on 04/28/15     FL2 transmitted to all facilities within larger geographic area on       Patient informed that his/her managed care company has contracts with or will negotiate with certain facilities, including the following:            Patient/family informed of bed offers received.  Patient chooses bed at       Physician recommends and patient chooses bed at      Patient to be transferred to   on  .  Patient to be transferred to facility by       Patient family notified on   of transfer.  Name of family member notified:        PHYSICIAN Please sign FL2     Additional Comment:    _______________________________________________ Ladell Pier, LCSW 04/28/2015, 5:10  PM

## 2015-04-28 NOTE — Progress Notes (Signed)
[facility] PROGRESS NOTE   Larry Bradford [id number] DOB: 09-05-1916 DOA: 04/25/2015 PCP: Doristine Devoid, MD  HPI/Subjective: Patient is very hard of hearing, feels much better today, sitting and eating as breakfast. Reportedly had seizure episodes yesterday for which after he was not confused (?real episode) No Fever or chills.  Assessment/Plan: Active Problems:   Hypertension   Seizures (HCC)   CKD (chronic kidney disease), stage III   Acute encephalopathy   Atrial tachycardia (HCC)   Coronary artery disease involving coronary bypass graft of native heart without angina pectoris   UTI (lower urinary tract infection), secondary to chronic indwelling Foley catheter   UTI (urinary tract infection) due to urinary indwelling catheter (HCC)   Abdominal distention    UTI Complicated UTI, because of chronic indwelling Foley catheter. Urinalysis consistent with UTI, started on Rocephin, continue. Urine culture showed no growth, still continue antibiotics as patient has strongly positive urine.  Acute metabolic encephalopathy Patient was lethargic and somnolent yesterday which is likely secondary to the UTI. No sedating medications, avoid narcotics and benzos, follow clinically.  Abdominal distention Patient abdomen is distended and mild diffuse tenderness. Abdominal x-ray showed normal bowel pattern. There is moderate stool burden. Did not have bowel movement all now, repeat laxatives  Acute renal failure Baseline creatinine is 1.1, patient presented with creatinine of 1.3. This is improved after IV fluid hydration to 1.1 again.  History of seizure disorder On Keppra, continue. Reportedly had seizure episode yesterday, will not change medications. Patient receiving medication can decrease seizure threshold like benzos and narcotics.  Hypertension Chronic condition, stable.  Code Status: Full Code Family Communication: Plan discussed with the patient.  PT/OT to evaluate and treat Disposition Plan: Remains inpatient Diet: Diet Heart Room service appropriate?: Yes; Fluid consistency:: Thin  Consultants:  None  Procedures:  None  Antibiotics:  Ceftriaxone 04/26/2015 (indicate start date, and stop date if known)   Objective: Filed Vitals:   04/28/15 0419 04/28/15 0957  BP: 123/68 130/58  Pulse: 70 69  Temp: 97.6 F (36.4 C)   Resp: 20     Intake/Output Summary (Last 24 hours) at 04/28/15 1036 Last data filed at 04/28/15 0645  Gross per 24 hour  Intake   1990 ml  Output   1725 ml  Net    265 ml   Filed Weights   04/26/15 1500  Weight: 73.029 kg (161 lb)    Exam: General: Alert and awake, oriented x3, not in any acute distress. HEENT: anicteric sclera, pupils reactive to light and accommodation, EOMI CVS: S1-S2 clear, no murmur rubs or gallops Chest: clear to auscultation bilaterally, no wheezing, rales or rhonchi Abdomen: soft nontender, nondistended, normal bowel sounds, no organomegaly Extremities: no cyanosis, clubbing or edema noted bilaterally Neuro: Cranial nerves II-XII intact, no focal neurological deficits  Data Reviewed: Basic Metabolic Panel:  Recent Labs Lab 04/23/15 2153 04/25/15 2227 04/26/15 0404 04/27/15 0400 04/28/15 0411  NA 139 142 142 136 142  K 4.7 4.2 4.1 4.1 3.8  CL 108 109 109 107 110  CO2 27 27 28 22 27   GLUCOSE 138* 126* 99 86 176*  BUN 27* 23* 21* 19 17  CREATININE 1.32* 1.32* 1.15 1.14 1.09  CALCIUM 9.2 9.4 8.8* 8.1* 8.6*  MG  --   --  1.8  --   --   PHOS  --   --  4.0  --   --    Liver Function Tests:  Recent Labs Lab 04/26/15 0404  AST  20  ALT 22  ALKPHOS 56  BILITOT 0.8  PROT 6.5  ALBUMIN 2.8*   No results for input(s): LIPASE, AMYLASE in the last 168 hours. No results for input(s): AMMONIA in the last 168 hours. CBC:  Recent Labs Lab 04/23/15 2153 04/25/15 2302 04/26/15 0404 04/27/15 0400 04/28/15 0411  WBC 9.1 9.8 8.7 8.3 9.2  NEUTROABS 6.2 6.4   --   --   --   HGB 12.0* 10.7* 10.7* 10.7* 10.9*  HCT 36.8* 33.1* 33.0* 33.0* 33.7*  MCV 96.6 96.8 97.9 99.1 96.0  PLT 254 228 228 198 210   Cardiac Enzymes: No results for input(s): CKTOTAL, CKMB, CKMBINDEX, TROPONINI in the last 168 hours. BNP (last 3 results)  Recent Labs  02/07/15 2154  BNP 48.2    ProBNP (last 3 results) No results for input(s): PROBNP in the last 8760 hours.  CBG:  Recent Labs Lab 04/26/15 2338 04/27/15 0001  GLUCAP 68 99    Micro Recent Results (from the past 240 hour(s))  Urine culture     Status: None   Collection Time: 04/23/15 10:14 PM  Result Value Ref Range Status   Specimen Description URINE, CATHETERIZED  Final   Special Requests NONE  Final   Culture   Final    NO GROWTH 2 DAYS Performed at [facility]    Report Status 04/25/2015 FINAL  Final  Urine culture     Status: None   Collection Time: 04/25/15 10:07 PM  Result Value Ref Range Status   Specimen Description URINE, CATHETERIZED  Final   Special Requests Normal  Final   Culture   Final    NO GROWTH 1 DAY Performed at [facility]    Report Status 04/27/2015 FINAL  Final     Studies: No results found.  Scheduled Meds: . antiseptic oral rinse  7 mL Mouth Rinse q12n4p  . aspirin EC  81 mg Oral Daily  . cefTRIAXone (ROCEPHIN) IVPB 1 gram/50 mL D5W  1 g Intravenous Q24H  . chlorhexidine  15 mL Mouth Rinse BID  . finasteride  5 mg Oral Daily  . levETIRAcetam  500 mg Oral Q12H  . lidocaine  1 application Urethral Once  . metoprolol succinate  25 mg Oral Daily  . mirabegron ER  25 mg Oral Daily  . mirtazapine  7.5 mg Oral QHS  . polyethylene glycol  17 g Oral Daily  . tamsulosin  0.4 mg Oral QPC supper  . triamcinolone cream  1 application Topical BID   Continuous Infusions: . dextrose 5 % and 0.9% NaCl 10 mL/hr at 04/28/15 0711       Time spent: 35 minutes    [facility]  [facility] Pager [phone] If 7PM-7AM, please contact  night-coverage at [email or web address], password [facility] 04/28/2015, 10:36 AM  LOS: 2 days

## 2015-04-28 NOTE — NC FL2 (Signed)
[place] LEVEL OF CARE SCREENING TOOL     IDENTIFICATION  Patient Name: Larry Bradford Birthdate: 1916-11-08 Sex: male Admission Date (Current Location): 04/25/2015  [facility] and [place] Number: [facility] and Address:         Provider Number:    Attending Physician Name and Address:  Verlee Monte, MD  Relative Name and Phone Number:       Current Level of Care: Hospital Recommended Level of Care: [place] Prior Approval Number:    Date Approved/Denied:   PASRR Number:  ([id number])  Discharge Plan: SNF    Current Diagnoses: Patient Active Problem List   Diagnosis Date Noted  . UTI (urinary tract infection) due to urinary indwelling catheter ([place]) 04/26/2015  . Abdominal distention   . UTI (lower urinary tract infection), secondary to chronic indwelling Foley catheter 04/06/2015  . Delirium 04/06/2015  . Atrial tachycardia ([place])   . Coronary artery disease involving coronary bypass graft of native heart without angina pectoris   . Altered mental status 02/08/2015  . Tachycardia 02/07/2015  . Rash 02/07/2015  . Fall 02/07/2015  . Acute encephalopathy 02/07/2015  . History of stroke 04/19/2013  . Conjunctivitis, bacterial 01/09/2013  . SOB (shortness of breath) 01/07/2013  . Aortic valve disorder 01/07/2013  . Hyperkalemia 12/20/2012  . Seizures ([place])   . Cataract   . Hard of hearing   . CKD (chronic kidney disease), stage III   . Depression   . BPH (benign prostatic hyperplasia)   . CAD (coronary artery disease)   . Hyperlipidemia   . Carotid stenosis   . Hypertension 06/18/2011  . Weakness 06/18/2011    Orientation ACTIVITIES/SOCIAL BLADDER RESPIRATION           O2 (As needed) (4L/min)  BEHAVIORAL SYMPTOMS/MOOD NEUROLOGICAL BOWEL NUTRITION STATUS    Convulsions/Seizures (hx of seizure disorder, on keppra)   Diet (Diet Heart)  PHYSICIAN VISITS COMMUNICATION OF NEEDS Height & Weight Skin      5'  9" (175.3 cm) 161 lbs.            AMBULATORY STATUS RESPIRATION    Supervision limited (+1 assist, +2 for safety/equipment) O2 (As needed) (4L/min)      Personal Care Assistance Level of Assistance  Bathing, Feeding, Dressing Bathing Assistance: Limited assistance Feeding assistance: Independent Dressing Assistance: Limited assistance      Functional Limitations [place]  PT (By licensed PT), OT (By licensed OT)     PT Frequency: 5 x a week OT Frequency: 5 x a week           Additional Factors Info  Allergies   Allergies Info: Benadryl, Oxycodone, Tylenol           Current Medications (04/28/2015):  This is the current hospital active medication list Current Facility-Administered Medications  Medication Dose Route Frequency Provider Last Rate Last Dose  . antiseptic oral rinse (CPC / CETYLPYRIDINIUM CHLORIDE 0.05%) solution 7 mL  7 mL Mouth Rinse q12n4p Verlee Monte, MD   7 mL at 04/28/15 1600  . aspirin EC tablet 81 mg  81 mg Oral Daily Toy Baker, MD   81 mg at 04/28/15 0957  . cefTRIAXone (ROCEPHIN) 1 g in dextrose 5 % 50 mL IVPB  1 g Intravenous Q24H Toy Baker, MD   1 g at 04/27/15 2218  . chlorhexidine (PERIDEX) 0.12 %  solution 15 mL  15 mL Mouth Rinse BID Verlee Monte, MD   15 mL at 04/28/15 0957  . dextrose 5 %-0.9 % sodium chloride infusion   Intravenous Continuous Verlee Monte, MD 10 mL/hr at 04/28/15 0711    . finasteride (PROSCAR) tablet 5 mg  5 mg Oral Daily Toy Baker, MD   5 mg at 04/28/15 0957  . HYDROmorphone (DILAUDID) injection 0.5 mg  0.5 mg Intravenous Q4H PRN Verlee Monte, MD   0.5 mg at 04/27/15 1810  . levETIRAcetam (KEPPRA) tablet 500 mg  500 mg Oral Q12H Toy Baker, MD   500 mg at 04/28/15 0957  . lidocaine (XYLOCAINE) 2 % jelly 1 application  1 application Urethral Once Blanchie Dessert, MD   1 application at [id number]  . metoprolol succinate (TOPROL-XL) 24  hr tablet 25 mg  25 mg Oral Daily Toy Baker, MD   25 mg at 04/28/15 0957  . mirabegron ER (MYRBETRIQ) tablet 25 mg  25 mg Oral Daily Toy Baker, MD   25 mg at 04/28/15 0957  . mirtazapine (REMERON) tablet 7.5 mg  7.5 mg Oral QHS Toy Baker, MD   7.5 mg at 04/27/15 2219  . ondansetron (ZOFRAN) tablet 4 mg  4 mg Oral Q6H PRN Toy Baker, MD       Or  . ondansetron (ZOFRAN) injection 4 mg  4 mg Intravenous Q6H PRN Toy Baker, MD   4 mg at 04/27/15 1637  . polyethylene glycol (MIRALAX / GLYCOLAX) packet 17 g  17 g Oral Daily Verlee Monte, MD   17 g at 04/28/15 0957  . tamsulosin (FLOMAX) capsule 0.4 mg  0.4 mg Oral QPC supper Toy Baker, MD   0.4 mg at 04/27/15 2219  . traMADol (ULTRAM) tablet 50 mg  50 mg Oral Q8H PRN Toy Baker, MD   50 mg at 04/27/15 1129  . triamcinolone cream (KENALOG) 0.1 % 1 application  1 application Topical BID Toy Baker, MD   1 application at [id number]     Discharge Medications: Please see discharge summary for a list of discharge medications.  Relevant Imaging Results:  Relevant Lab Results:  Recent Labs    Additional Information SS # [id number]   Adrianah Prophete A, LCSW

## 2015-04-29 LAB — BASIC METABOLIC PANEL
Anion gap: 6 (ref 5–15)
BUN: 13 mg/dL (ref 6–20)
CHLORIDE: 106 mmol/L (ref 101–111)
CO2: 29 mmol/L (ref 22–32)
CREATININE: 1.02 mg/dL (ref 0.61–1.24)
Calcium: 8.5 mg/dL — ABNORMAL LOW (ref 8.9–10.3)
GFR calc Af Amer: >60 mL/min (ref >60)
GFR calc non Af Amer: 60 mL/min — ABNORMAL LOW (ref >60)
Glucose, Bld: 113 mg/dL — ABNORMAL HIGH (ref 65–99)
POTASSIUM: 4 mmol/L (ref 3.5–5.1)
SODIUM: 141 mmol/L (ref 135–145)

## 2015-04-29 MED ORDER — LORAZEPAM 0.5 MG PO TABS: 0.5000 mg | ORAL_TABLET | Freq: Three times a day (TID) | ORAL | Status: DC | PRN

## 2015-04-29 MED ORDER — FLUCONAZOLE 100 MG PO TABS: 100.0000 mg | ORAL_TABLET | Freq: Every day | ORAL | Status: DC

## 2015-04-29 MED ORDER — TRAMADOL HCL 50 MG PO TABS: 50.0000 mg | ORAL_TABLET | Freq: Three times a day (TID) | ORAL | Status: DC | PRN

## 2015-04-29 MED ORDER — QUETIAPINE FUMARATE 25 MG PO TABS: 25.0000 mg | ORAL_TABLET | Freq: Every day | ORAL | Status: AC

## 2015-04-29 MED ORDER — LORAZEPAM 2 MG/ML IJ SOLN
1.0000 mg | Freq: Once | INTRAMUSCULAR | Status: AC
  Administered 2015-04-29: 0.5 mg via INTRAVENOUS
  Filled 2015-04-29: qty 1

## 2015-04-29 MED ORDER — CEFUROXIME AXETIL 500 MG PO TABS: 500.0000 mg | ORAL_TABLET | Freq: Two times a day (BID) | ORAL | Status: DC

## 2015-04-29 NOTE — Progress Notes (Signed)
Patient calmer but still suspicious of staff and untrusting of staff. Lying in bed. Will continue to monitor.

## 2015-04-29 NOTE — Progress Notes (Signed)
CSW continuing to follow.   CSW followed up with pt daughter, Jenny Reichmann this morning to discuss SNF bed offers. CSW reviewed offers with pt daughter and pt daughter chooses [facility].   CSW discussed with pt daughter that pt displayed confusion overnight. Pt daughter expressed that pt is not confused as exhibited last night at baseline. CSW informed pt daughter that if pt became confused and agitated at SNF more than the facility can handle then it is likely that the SNF will contact family to come sit with pt or ask family to hire someone to come be with pt. Pt daughter expressed understanding and is hopeful that pt will not have further episodes of confusion displayed last night.   CSW contacted [facility] and confirmed bed availability for today.   CSW sent clinical information to [facility] and received insurance authorization from [facility] for SNF stay (Auth #: [id number]).   CSW facilitated pt discharge needs including contacting facility, faxing pt discharge information via [place] received [employer] authorization information, providing RN phone number to call report, discussing with pt daughter, Jenny Reichmann at bedside, and arranging ambulance transport for pt to [facility].   No further social work needs identified at this time.  CSW signing off.   Alison Murray, MSW, [place] Work [phone]

## 2015-04-29 NOTE — Care Management Important Message (Signed)
Important Message  Patient Details  Name: ARL VANDORN MRN: [id number] Date of Birth: 08-03-1916   Medicare Important Message Given:  Yes    Camillo Flaming 04/29/2015, 11:10 AMImportant Message  Patient Details  Name: EYOEL DAWS MRN: [id number] Date of Birth: [birth year]   Medicare Important Message Given:  Yes    Camillo Flaming 04/29/2015, 11:10 AM

## 2015-04-29 NOTE — Progress Notes (Signed)
Patient has slept very little tonight. Slept at the start of the shift about 1.5 hours. He had slept about 1 hour since then. Been lying in bed watching TV. Calm and cooperative.

## 2015-04-29 NOTE — Clinical Social Work Placement (Signed)
   CLINICAL SOCIAL WORK PLACEMENT  NOTE  Date:  04/29/2015  Patient Details  Name: Larry Bradford MRN: [id number] Date of Birth: March 17, 1917  Clinical Social Work is seeking post-discharge placement for this patient at the [place] level of care (*CSW will initial, date and re-position this form in  chart as items are completed):  Yes   Patient/family provided with [place] Work Department's list of facilities offering this level of care within the geographic area requested by the patient (or if unable, by the patient's family).  Yes   Patient/family informed of their freedom to choose among providers that offer the needed level of care, that participate in Medicare, Medicaid or managed care program needed by the patient, have an available bed and are willing to accept the patient.  Yes   Patient/family informed of [facility]'s ownership interest in [facility] and [facility], as well as of the fact that they are under no obligation to receive care at these facilities.  PASRR submitted to EDS on       PASRR number received on       Existing PASRR number confirmed on 04/28/15     FL2 transmitted to all facilities in geographic area requested by pt/family on 04/28/15     FL2 transmitted to all facilities within larger geographic area on       Patient informed that his/her managed care company has contracts with or will negotiate with certain facilities, including the following:        Yes   Patient/family informed of bed offers received.  Patient chooses bed at [facility]     Physician recommends and patient chooses bed at      Patient to be transferred to [facility] on 04/29/15.  Patient to be transferred to facility by ambulance [name])     Patient family notified on 04/29/15 of transfer.  Name of family member notified:  pt daughter, Larry Bradford notified at bedside     PHYSICIAN Please sign FL2     Additional Comment:     _______________________________________________ Ladell Pier, LCSW 04/29/2015, 5:52 PM

## 2015-04-29 NOTE — Discharge Summary (Signed)
Physician Discharge Summary  Larry Bradford [id number] DOB: 1916/09/25 DOA: 04/25/2015  PCP: Doristine Devoid, MD  Admit date: 04/25/2015 Discharge date: 04/29/2015  Time spent: 40 minutes  Recommendations for Outpatient Follow-up:  1. Follow-up with nursing home M.D. 2. Seroquel 25 mg added at nighttime, Ativan 0.5 mg as needed for agitation. 3. Continue Ceftin and fluconazole for 5 more days.   Discharge Diagnoses:  Active Problems:   Hypertension   Seizures ([place])   CKD (chronic kidney disease), stage III   Acute encephalopathy   Atrial tachycardia (HCC)   Coronary artery disease involving coronary bypass graft of native heart without angina pectoris   UTI (lower urinary tract infection), secondary to chronic indwelling Foley catheter   UTI (urinary tract infection) due to urinary indwelling catheter ([place])   Abdominal distention   Discharge Condition: Stable  Diet recommendation: Heart healthy  Filed Weights   04/26/15 1500  Weight: 73.029 kg (161 lb)    History of present illness:  Larry Bradford is a 81 y.o. male   has a past medical history of Seizures ([place]); Cataract; Hard of hearing; Renal disorder; Depression; HTN (hypertension); BPH (benign prostatic hyperplasia); CAD (coronary artery disease); Hyperlipidemia; OSA (obstructive sleep apnea); Aortic aneurysm ([place]); Carotid stenosis; DDD (degenerative disc disease); Trigeminal neuralgia; Lower GI bleed; Stroke [facility]); Allergy; and Aortic stenosis.   Patient has history of recurrent UTIs secondary to chronic indwelling Foley catheter he has been admitted in the past on a 6 urinary tract infection treated with IV ceftriaxone. The plan was for patient to follow-up with urology regarding the foley catheter. Patient has been residing at [facility] facility up until Saturday when he was released his been having some occasional diarrhea although mild and started to have blood and urine today. Family became  concerned a washer for how long the catheter has been noticed that his abdomen becoming distended. On 23rd of November they brought him in to emerge department and was diagnosed with urethral irritation. Patient presented back today the acute urinary retention and inability to pass urine for past 12 hours. He's been reporting significant abdominal pain that had not had any fevers or chills. Patient has been having some bloody discharge from around meatus they have dilatated and secondary to chronic indwelling Foley. In emergency department a larger catheter was placed and flushed. Large clot has passed and since then patient was able to produce urine was still complaining of abdominal discomfort. UA showing many bacteria as well as to numerous to count white blood cells and red blood cells. Diarrhea seems to have resolved currently abdominal pain improved. Noted to be transiently hypoxic down to 84% on RA Patient is being admitted for recurrent UTI  Hospital Course:   UTI Complicated UTI, because of chronic indwelling Foley catheter. Urinalysis consistent with UTI, started on Rocephin, continue. Urine culture showed no growth, still continue antibiotics as patient has strongly positive urine. On discharge Ceftin and fluconazole for 5 more days.  Acute metabolic encephalopathy Patient was lethargic and somnolent yesterday which is likely secondary to the UTI. No sedating medications, avoid narcotics and benzos. Patient gets confused towards nighttime which likely secondary to acute delirium/sundowning. Seroquel 25 mg added at nighttime, Ativan 0.5 mg at needed for agitation.  Abdominal distention Patient abdomen is distended and mild diffuse tenderness. Abdominal x-ray showed normal bowel pattern. There is moderate stool burden. This is improved after bowel movements.  Acute renal failure Baseline creatinine is 1.1, patient presented with creatinine of 1.3.  Acute renal failure resolved  after hydration with IV fluids.  History of seizure disorder On Keppra, continue. Reportedly had seizure episode while he was in the hospital, will not change medications. Unclear to me the reported episode was clear seizure or decrease responsiveness. Patient did not have postictal state afterwards, no changes in medications.  Hypertension Chronic condition, stable.   Procedures:  None  Consultations:  None  Discharge Exam: Filed Vitals:   04/28/15 2205 04/29/15 0118  BP: 128/53   Pulse: 98 68  Temp: 98.8 F (37.1 C)   Resp: 18    General: Alert and awake, oriented x3, not in any acute distress. HEENT: anicteric sclera, pupils reactive to light and accommodation, EOMI CVS: S1-S2 clear, no murmur rubs or gallops Chest: clear to auscultation bilaterally, no wheezing, rales or rhonchi Abdomen: soft nontender, nondistended, normal bowel sounds, no organomegaly Extremities: no cyanosis, clubbing or edema noted bilaterally Neuro: Cranial nerves II-XII intact, no focal neurological deficits  Discharge Instructions   Discharge Instructions    Diet - low sodium heart healthy    Complete by:  As directed      Increase activity slowly    Complete by:  As directed           Current Discharge Medication List    START taking these medications   Details  cefUROXime (CEFTIN) 500 MG tablet Take 1 tablet (500 mg total) by mouth 2 (two) times daily with a meal. Qty: 10 tablet, Refills: 0    fluconazole (DIFLUCAN) 100 MG tablet Take 1 tablet (100 mg total) by mouth daily. Qty: 10 tablet, Refills: 0    LORazepam (ATIVAN) 0.5 MG tablet Take 1 tablet (0.5 mg total) by mouth every 8 (eight) hours as needed (For Agitation). Qty: 10 tablet, Refills: 0    QUEtiapine (SEROQUEL) 25 MG tablet Take 1 tablet (25 mg total) by mouth at bedtime.      CONTINUE these medications which have CHANGED   Details  traMADol (ULTRAM) 50 MG tablet Take 1 tablet (50 mg total) by mouth every 8  (eight) hours as needed for moderate pain. Qty: 10 tablet, Refills: 0      CONTINUE these medications which have NOT CHANGED   Details  aspirin EC 81 MG tablet Take 1 tablet (81 mg total) by mouth daily.    finasteride (PROSCAR) 5 MG tablet Take 1 tablet (5 mg total) by mouth daily.    levETIRAcetam (KEPPRA) 500 MG tablet Take 1 tablet (500 mg total) by mouth every 12 (twelve) hours. Qty: 30 tablet, Refills: 0    metoprolol succinate (TOPROL-XL) 25 MG 24 hr tablet Take 1 tablet (25 mg total) by mouth daily.    mirabegron ER (MYRBETRIQ) 25 MG TB24 tablet Take 25 mg by mouth daily.    mirtazapine (REMERON) 7.5 MG tablet Take 1 tablet (7.5 mg total) by mouth at bedtime as needed.    polyethylene glycol (MIRALAX / GLYCOLAX) packet Take 17 g by mouth daily. Qty: 14 each, Refills: 0    senna-docusate (SENOKOT-S) 8.6-50 MG tablet Take 2 tablets by mouth 2 (two) times daily. Qty: 30 tablet, Refills: 0    tamsulosin (FLOMAX) 0.4 MG CAPS capsule Take 1 capsule (0.4 mg total) by mouth daily after supper.    triamcinolone cream (KENALOG) 0.1 % Apply 1 application topically 2 (two) times daily.      STOP taking these medications     feeding supplement, ENSURE ENLIVE, (ENSURE ENLIVE) LIQD  Allergies  Allergen Reactions  . Benadryl [Diphenhydramine Hcl]     "talks out of his head"  . Oxycodone     Feels crazy  . Tylenol [Acetaminophen]     unknown      The results of significant diagnostics from this hospitalization (including imaging, microbiology, ancillary and laboratory) are listed below for reference.    Significant Diagnostic Studies: Dg Chest 2 View  04/26/2015  CLINICAL DATA:  Abdominal distention EXAM: CHEST  2 VIEW COMPARISON:  04/06/2015 chest radiograph. FINDINGS: Median sternotomy wires are aligned and intact. Coronary artery stent overlies the left heart. CABG clips are seen throughout the mediastinum. Low lung volumes. Stable cardiomediastinal silhouette  with mild cardiomegaly. No pneumothorax. Increased small left pleural effusion. No right pleural effusion. No overt pulmonary edema. Patchy opacities in both lower lobes, slightly increased. IMPRESSION: 1. Stable mild cardiomegaly without overt pulmonary edema. 2. Increased small left pleural effusion. 3. Low lung volumes. Mild patchy bibasilar lung opacities, slightly increased, favor atelectasis. Electronically Signed   By: Ilona Sorrel M.D.   On: 04/26/2015 09:58   Dg Chest 2 View  04/06/2015  CLINICAL DATA:  [place] today.  Confusion. EXAM: CHEST  2 VIEW COMPARISON:  04/03/2015 FINDINGS: The heart is enlarged but stable. Stable surgical changes from bypass surgery. Low lung volumes with vascular crowding and streaky basilar atelectasis. No definite infiltrates or effusions. The bony thorax is intact. Stable remote compression fracture of L1. IMPRESSION: Stable cardiac enlargement. Low lung volumes with vascular crowding and streaky bibasilar atelectasis. No definite infiltrates, effusions or edema. Electronically Signed   By: Marijo Sanes M.D.   On: 04/06/2015 16:09   Dg Chest 2 View  04/03/2015  CLINICAL DATA:  81 year old male presents with generalized weakeness x 3-4 weeks w/ decreased body temp of 91. Hx of CABG, seizure, CAD, HTN, EXAM: CHEST  2 VIEW COMPARISON:  02/07/2015 FINDINGS: Lateral view degraded by patient arm position. Prior median sternotomy. Mild vertebral body height loss of an Upper lumbar or lumbosacral junction body, chronic. Midline trachea. Numerous leads and wires project over the chest. Moderate cardiomegaly. No pleural effusion or pneumothorax. No congestive failure. Clear lungs. IMPRESSION: Cardiomegaly, without acute disease. Electronically Signed   By: Abigail Miyamoto M.D.   On: 04/03/2015 19:06   Dg Abd 1 View  04/26/2015  CLINICAL DATA:  Abdominal distention EXAM: ABDOMEN - 1 VIEW COMPARISON:  04/11/2015 abdominal radiograph FINDINGS: No disproportionately dilated small bowel  loops. Mild to moderate stool throughout the visualized colon. No evidence of pneumatosis or pneumoperitoneum. Mild degenerative changes in the lumbar spine. No pathologic soft tissue calcifications. IMPRESSION: Nonobstructive bowel gas pattern.  Mild-to-moderate colonic stool. Electronically Signed   By: Ilona Sorrel M.D.   On: 04/26/2015 09:51   Ct Head Wo Contrast  04/08/2015  CLINICAL DATA:  Acute onset confusion and altered mental status EXAM: CT HEAD WITHOUT CONTRAST TECHNIQUE: Contiguous axial images were obtained from the base of the skull through the vertex without intravenous contrast. COMPARISON:  April 06, 2015 FINDINGS: There is mild generalized atrophy 3 there is no intracranial mass, hemorrhage, extra-axial fluid collection, or midline shift. Patchy small vessel disease in the centra semiovale bilaterally is stable. There is a prior lacunar infarct in the left thalamus, stable. There is a prior small lacunar infarct at the genu of the right internal capsule, stable. No acute infarct evident. Bony calvarium appears intact. The mastoid air cells are clear. There is a stable retention cyst in right maxillary antrum.  IMPRESSION: Stable atrophy with periventricular small vessel disease. Prior small infarcts in the left thalamus and right internal capsule are stable. No acute infarct evident. No mass or hemorrhage. Stable retention cyst in right maxillary antrum. Electronically Signed   By: Lowella Grip III M.D.   On: 04/08/2015 15:13   Ct Head Wo Contrast  04/06/2015  CLINICAL DATA:  Fall.  Confusion. EXAM: CT HEAD WITHOUT CONTRAST CT CERVICAL SPINE WITHOUT CONTRAST TECHNIQUE: Multidetector CT imaging of the head and cervical spine was performed following the standard protocol without intravenous contrast. Multiplanar CT image reconstructions of the cervical spine were also generated. COMPARISON:  02/07/2015 head and cervical spine CT. FINDINGS: CT HEAD FINDINGS No evidence of parenchymal  hemorrhage or extra-axial fluid collection. No mass lesion, mass effect, or midline shift. No CT evidence of acute infarction. There is intracranial atherosclerosis. There is stable diffuse cerebral volume loss. There is stable encephalomalacia in the right temporal lobe with stable ex vacuo dilatation of the temporal horn of the right lateral ventricle. Stable lacunar infarct in the left thalamus. Nonspecific stable subcortical and periventricular white matter hypodensity, most in keeping with chronic small vessel ischemic change. No acute ventriculomegaly. Stable mucous retention cyst versus polyp in the posterior inferior right maxillary sinus, decreased in size since 12/20/2012. The visualized paranasal sinuses are otherwise essentially clear. The mastoid air cells are unopacified. No evidence of calvarial fracture. CT CERVICAL SPINE FINDINGS No fracture or acute subluxation is detected in the cervical spine. No prevertebral soft tissue swelling. There is straightening of the cervical spine, usually due to positioning and/or muscle spasm. Dens is well positioned between the lateral masses of C1. The lateral masses appear well-aligned. Marked degenerative disc disease in the mid to lower cervical spine, most prominent at C5-6. Marked bilateral facet arthropathy, asymmetric to the left. Stable degenerative 3 mm anterolisthesis at C4-5 and C5-6. Mild degenerative foraminal stenosis on the left at C3-4 and bilaterally at C4-5. Visualized mastoid air cells appear clear. No evidence of intra-axial hemorrhage in the visualized brain. No gross cervical canal hematoma. No acute consolidative airspace disease or significant pulmonary nodules at the visualized lung apices. No appreciable cervical adenopathy or other cervical spine soft tissue abnormality. IMPRESSION: 1.  No evidence of acute intracranial abnormality. 2. Stable intracranial atherosclerosis, diffuse cerebral volume loss, right temporal lobe encephalomalacia,  left colonic infarct and chronic small vessel ischemic white matter change. 3. No fracture or acute malalignment in the cervical spine. 4. Marked degenerative changes in the cervical spine as described. Electronically Signed   By: Ilona Sorrel M.D.   On: 04/06/2015 16:34   Ct Cervical Spine Wo Contrast  04/06/2015  CLINICAL DATA:  Fall.  Confusion. EXAM: CT HEAD WITHOUT CONTRAST CT CERVICAL SPINE WITHOUT CONTRAST TECHNIQUE: Multidetector CT imaging of the head and cervical spine was performed following the standard protocol without intravenous contrast. Multiplanar CT image reconstructions of the cervical spine were also generated. COMPARISON:  02/07/2015 head and cervical spine CT. FINDINGS: CT HEAD FINDINGS No evidence of parenchymal hemorrhage or extra-axial fluid collection. No mass lesion, mass effect, or midline shift. No CT evidence of acute infarction. There is intracranial atherosclerosis. There is stable diffuse cerebral volume loss. There is stable encephalomalacia in the right temporal lobe with stable ex vacuo dilatation of the temporal horn of the right lateral ventricle. Stable lacunar infarct in the left thalamus. Nonspecific stable subcortical and periventricular white matter hypodensity, most in keeping with chronic small vessel ischemic change. No acute ventriculomegaly. Stable mucous  retention cyst versus polyp in the posterior inferior right maxillary sinus, decreased in size since 12/20/2012. The visualized paranasal sinuses are otherwise essentially clear. The mastoid air cells are unopacified. No evidence of calvarial fracture. CT CERVICAL SPINE FINDINGS No fracture or acute subluxation is detected in the cervical spine. No prevertebral soft tissue swelling. There is straightening of the cervical spine, usually due to positioning and/or muscle spasm. Dens is well positioned between the lateral masses of C1. The lateral masses appear well-aligned. Marked degenerative disc disease in the mid  to lower cervical spine, most prominent at C5-6. Marked bilateral facet arthropathy, asymmetric to the left. Stable degenerative 3 mm anterolisthesis at C4-5 and C5-6. Mild degenerative foraminal stenosis on the left at C3-4 and bilaterally at C4-5. Visualized mastoid air cells appear clear. No evidence of intra-axial hemorrhage in the visualized brain. No gross cervical canal hematoma. No acute consolidative airspace disease or significant pulmonary nodules at the visualized lung apices. No appreciable cervical adenopathy or other cervical spine soft tissue abnormality. IMPRESSION: 1.  No evidence of acute intracranial abnormality. 2. Stable intracranial atherosclerosis, diffuse cerebral volume loss, right temporal lobe encephalomalacia, left colonic infarct and chronic small vessel ischemic white matter change. 3. No fracture or acute malalignment in the cervical spine. 4. Marked degenerative changes in the cervical spine as described. Electronically Signed   By: Ilona Sorrel M.D.   On: 04/06/2015 16:34   Ct Abdomen Pelvis W Contrast  04/03/2015  CLINICAL DATA:  81 year old male with weakness and abdominal pain EXAM: CT ABDOMEN AND PELVIS WITH CONTRAST TECHNIQUE: Multidetector CT imaging of the abdomen and pelvis was performed using the standard protocol following bolus administration of intravenous contrast. CONTRAST:  24mL OMNIPAQUE IOHEXOL 300 MG/ML SOLN, 168mL OMNIPAQUE IOHEXOL 300 MG/ML SOLN COMPARISON:  Prior CT abdomen/ pelvis 02/08/2015 FINDINGS: Lower Chest: The visualized lower lungs are clear. Stable borderline cardiomegaly. Atherosclerotic calcification of the aortic valve. No pericardial effusion. Unremarkable distal thoracic esophagus. Abdomen: Unremarkable CT appearance of the stomach, duodenum, spleen, adrenal glands and pancreas. Normal hepatic contour morphology. There are a few tiny circumscribed hypo intense lesions likely representing small cysts or biliary hamartomas. Cholelithiasis  without secondary findings to suggest acute cholecystitis. No intra or extrahepatic biliary ductal dilatation. Multiple circumscribed water attenuation cysts arising from both kidneys. The largest is stable at 9.7 cm exophytic from the lower pole of the left kidney. No enhancing renal mass or hydronephrosis. Colonic diverticular disease without CT evidence of active inflammation. No evidence of obstruction or focal bowel wall thickening. Normal appendix in the right lower quadrant. The terminal ileum is unremarkable. No free fluid or suspicious adenopathy. Pelvis: A Foley catheter is present within the bladder. Additionally, there are 2 small bladder stones. The bladder wall is circumferentially mildly thickened and there is a suggestion of haziness and stranding in the perivesicular fat. Massive prostatomegaly again noted. The prostate gland measures 7 cm in diameter. No free fluid or suspicious adenopathy. Bones/Soft Tissues: Bilateral fat containing inguinal hernias. No acute fracture or aggressive appearing lytic or blastic osseous lesion. Stable T12 and L2 compression fractures without interval progression of height loss. Multilevel degenerative disc disease. Stable grade 1 anterolisthesis of L4 on L5. Vascular: Irregular and ulcerated fibro fatty plaque versus wall adherent mural thrombus in the abdominal aorta. There is fusiform aneurysmal dilatation of the infrarenal abdominal aorta with a maximal diameter of 4.5 cm which is unchanged compared to the relatively recent prior imaging. Scattered atherosclerotic vascular calcifications. IMPRESSION: 1. Mild circumferential thickening of  the bladder wall is nonspecific in the setting of underdistention and massive prostatomegaly. This may simply represent bladder wall trabeculation from chronic bladder outlet obstruction. However, acute cystitis would be difficult to exclude entirely. 2. Two small bladder stones.  A Foley catheter is also present. 3. Mild  cardiomegaly with calcification of the aortic valve. Query clinical history of aortic stenosis? 4. Cholelithiasis without secondary findings to suggest acute cholecystitis. 5. Colonic diverticular disease without CT evidence of active inflammation. 6. Multilevel degenerative disc disease and stable grade 1 anterolisthesis of L4 on L5. 7. Stable T12 and L2 compression fractures. 8. Stable 4.5 cm infrarenal abdominal aortic aneurysm. Recommend followup by abdomen and pelvis CTA in 6 months, and vascular surgery referral/consultation if not already obtained. This recommendation follows ACR consensus guidelines: White Paper of the ACR Incidental Findings Committee II on Vascular Findings. J Am Coll Radiol 2013; 10:789-794. Electronically Signed   By: Jacqulynn Cadet M.D.   On: 04/03/2015 20:30   Dg Abd 2 Views  04/11/2015  CLINICAL DATA:  Abdominal pain EXAM: ABDOMEN - 2 VIEW COMPARISON:  CT abdomen and pelvis 04/03/2015 FINDINGS: Retained barium in distal colon and rectum. Sigmoid and descending colonic diverticulosis. Nonobstructive bowel gas pattern. No bowel dilatation, bowel wall thickening or free intraperitoneal air. Diffuse osseous demineralization with compression fractures of T12 and L2. Degenerative disc and facet disease changes lumbar spine. Atherosclerotic calcifications including at a segment of aneurysmal dilatation of the distal abdominal aorta, better visualized and measured on prior CT. Questionable tiny nonobstructing inferior pole RIGHT renal calculus. IMPRESSION: No evidence of bowel obstruction or perforation. Distal colonic diverticulosis. Abdominal aortic aneurysm, please refer to recent CT abdomen/pelvis exam. Compression fractures of T12 and L2. Question tiny nonobstructing RIGHT renal calculus. Electronically Signed   By: Lavonia Dana M.D.   On: 04/11/2015 09:49    Microbiology: Recent Results (from the past 240 hour(s))  Urine culture     Status: None   Collection Time: 04/23/15  10:14 PM  Result Value Ref Range Status   Specimen Description URINE, CATHETERIZED  Final   Special Requests NONE  Final   Culture   Final    NO GROWTH 2 DAYS Performed at [facility]    Report Status 04/25/2015 FINAL  Final  Urine culture     Status: None   Collection Time: 04/25/15 10:07 PM  Result Value Ref Range Status   Specimen Description URINE, CATHETERIZED  Final   Special Requests Normal  Final   Culture   Final    NO GROWTH 1 DAY Performed at [facility]    Report Status 04/27/2015 FINAL  Final     Labs: Basic Metabolic Panel:  Recent Labs Lab 04/25/15 2227 04/26/15 0404 04/27/15 0400 04/28/15 0411 04/29/15 0408  NA 142 142 136 142 141  K 4.2 4.1 4.1 3.8 4.0  CL 109 109 107 110 106  CO2 27 28 22 27 29   GLUCOSE 126* 99 86 176* 113*  BUN 23* 21* 19 17 13   CREATININE 1.32* 1.15 1.14 1.09 1.02  CALCIUM 9.4 8.8* 8.1* 8.6* 8.5*  MG  --  1.8  --   --   --   PHOS  --  4.0  --   --   --    Liver Function Tests:  Recent Labs Lab 04/26/15 0404  AST 20  ALT 22  ALKPHOS 56  BILITOT 0.8  PROT 6.5  ALBUMIN 2.8*   No results for input(s): LIPASE, AMYLASE in the  last 168 hours. No results for input(s): AMMONIA in the last 168 hours. CBC:  Recent Labs Lab 04/23/15 2153 04/25/15 2302 04/26/15 0404 04/27/15 0400 04/28/15 0411  WBC 9.1 9.8 8.7 8.3 9.2  NEUTROABS 6.2 6.4  --   --   --   HGB 12.0* 10.7* 10.7* 10.7* 10.9*  HCT 36.8* 33.1* 33.0* 33.0* 33.7*  MCV 96.6 96.8 97.9 99.1 96.0  PLT 254 228 228 198 210   Cardiac Enzymes: No results for input(s): CKTOTAL, CKMB, CKMBINDEX, TROPONINI in the last 168 hours. BNP: BNP (last 3 results)  Recent Labs  02/07/15 2154  BNP 48.2    ProBNP (last 3 results) No results for input(s): PROBNP in the last 8760 hours.  CBG:  Recent Labs Lab 04/26/15 2338 04/27/15 0001  GLUCAP 68 99       Signed:  Yue Glasheen A  [facility] 04/29/2015, 11:34 AM
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
## 2015-04-29 NOTE — Progress Notes (Signed)
OT Cancellation Note  Patient Details Name: Larry Bradford MRN: [id number] DOB: 1916/12/24   Cancelled Treatment:    Reason Eval/Treat Not Completed: Other (comment).  Checked on pt twice this am.  First time, he was being cleaned up by nursing.  He is now asleep, and RN reports that he did not sleep at all last night.  Left my pager number with her if he wakes up and can be seen.    Gottlieb Zuercher 04/29/2015, 8:57 AM  Lesle Chris, OTR/L [phone] 04/29/2015

## 2015-04-29 NOTE — Clinical Social Work Note (Signed)
Clinical Social Work Assessment-Late Entry  Patient Details  Name: Larry Bradford MRN: [id number] Date of Birth: 08/16/1916  Date of referral:  04/28/15               Reason for consult:  Discharge Planning                Permission sought to share information with:  Family Supports Permission granted to share information::  Yes, Verbal Permission Granted  Name::     Larry Bradford::     Relationship::  daughter  Contact Information:  [phone]  Housing/Transportation Living arrangements for the past 2 months:    Source of Information:  Adult Children Patient Interpreter Needed:  None Criminal Activity/Legal Involvement Pertinent to Current Situation/Hospitalization:  No - Comment as needed Significant Relationships:  Spouse, Adult Children Lives with:  Spouse Do you feel safe going back to the place where you live?  No Need for family participation in patient care:  Yes (Comment)  Care giving concerns:  Pt admitted from home with pt wife. PT recommending SNF   Social Worker assessment / plan:  Pt hospitalized on [id number] with a complicated UTI and acute encephalopathy. CSW consulted to assist with d/c planning. CSW attempted to meet with pt at bedside, Pt is confused and unable to participate in assessment.  Pt daughter, Larry Bradford contact information listed on board in room. CSW contacted pt daughter via telephone. CSW introduced self and explained role. CSW discussed recommendation for SNF. Pt daughter familiar with process as pt recently went to [name]. Pt daughter stated that pt family did not want pt to go back to [name] and preferred [facility]. CSW discussed process of [name] search and pt daughter agreeable.  CSW completed FL2 and initiated search to [facility]. CSW notified [place] of pt family interest.   Pt has [employer] which requires authorization prior to discharge. CSW to submit for SNF auth.  CSW to follow up  with pt daughter re: SNF bed offers.   CSW to continue to follow to provide support and assist with pt discharge needs.  Employment status:  Retired Nurse, adult PT Recommendations:  [place] / Referral to community resources:  [place]  Patient/Family's Response to care:  Pt daughter reports that pt significant other is 79 years old and can no longer manage pt's care at home. Pt daughter actively involved with pt and pt care plan. Pt daughter hopeful for [facility].   Patient/Family's Understanding of and Emotional Response to Diagnosis, Current Treatment, and Prognosis:  Pt daughter displayed knowledge surrounding pt medical status. Pt daughter agreeable to SNF upon discharge.  Emotional Assessment Appearance:  Appears stated age Attitude/Demeanor/Rapport:  Unable to Assess Affect (typically observed):  Unable to Assess Orientation:  Oriented to Self Alcohol / Substance use:  Not Applicable Psych involvement (Current and /or in the community):  No (Comment)  Discharge Needs  Concerns to be addressed:  Discharge Planning Concerns Readmission within the last 30 days:  No Current discharge risk:  Physical Impairment Barriers to Discharge:  No Barriers Identified   [place], Ethel, LCSW 04/29/2015, 5:55 PM  [phone]

## 2015-04-29 NOTE — Progress Notes (Signed)
Attempted to give report  X 3  But no one picks up the phone. Will try again

## 2015-04-30 ENCOUNTER — Encounter: Payer: Self-pay | Admitting: Adult Health

## 2015-04-30 ENCOUNTER — Non-Acute Institutional Stay (SKILLED_NURSING_FACILITY): Payer: Medicare Other | Admitting: Adult Health

## 2015-04-30 DIAGNOSIS — R569 Unspecified convulsions: Secondary | ICD-10-CM

## 2015-04-30 DIAGNOSIS — T83511S Infection and inflammatory reaction due to indwelling urethral catheter, sequela: Secondary | ICD-10-CM

## 2015-04-30 DIAGNOSIS — F419 Anxiety disorder, unspecified: Secondary | ICD-10-CM | POA: Diagnosis not present

## 2015-04-30 DIAGNOSIS — N390 Urinary tract infection, site not specified: Secondary | ICD-10-CM | POA: Diagnosis not present

## 2015-04-30 DIAGNOSIS — R400 Somnolence: Secondary | ICD-10-CM

## 2015-04-30 DIAGNOSIS — I2510 Atherosclerotic heart disease of native coronary artery without angina pectoris: Secondary | ICD-10-CM | POA: Diagnosis not present

## 2015-04-30 DIAGNOSIS — K5900 Constipation, unspecified: Secondary | ICD-10-CM

## 2015-04-30 DIAGNOSIS — N400 Enlarged prostate without lower urinary tract symptoms: Secondary | ICD-10-CM | POA: Diagnosis not present

## 2015-04-30 DIAGNOSIS — R531 Weakness: Secondary | ICD-10-CM | POA: Diagnosis not present

## 2015-04-30 DIAGNOSIS — I10 Essential (primary) hypertension: Secondary | ICD-10-CM | POA: Diagnosis not present

## 2015-04-30 DIAGNOSIS — F32A Depression, unspecified: Secondary | ICD-10-CM

## 2015-04-30 DIAGNOSIS — F329 Major depressive disorder, single episode, unspecified: Secondary | ICD-10-CM

## 2015-04-30 NOTE — Progress Notes (Signed)
Patient ID: Larry Bradford, male   DOB: 09/05/1916, 82 y.o.   MRN: [id number]    DATE:  04/30/2015   MRN:  [id number]  BIRTHDAY: [birth year]  Facility:  Nursing Home Location:  [place] Room Number: [id number]  LEVEL OF CARE:  SNF [phone])  Contact Information    Name Relation Home Work Mobile   [place] Spouse [phone]     Allred,Cynthia Daughter [phone]         Chief Complaint  Patient presents with  . Hospitalization Follow-up    Generalized weakness, UTI, somnolence, seizure, anxiety, constipation, BPH with urinary retention, hypertension, depression and CAD    HISTORY OF PRESENT ILLNESS:  This is a 81 year old male who was been admitted to [facility] on 04/29/15 from [facility]. He has PMH of seizure, cataract, HOH, renal disorder, depression, hypertension, BPH, CAD, hyperlipidemia, OSA, I aortic aneurysm, carotid stenosis, DDD, to give me no neuralgia, lower GI bleed and aortic stenosis. He has a history of having frequent UTIs due to chronic indwelling catheter. He was previously at [facility] and was recently brought to ER due to hematuria. He was diagnosed with urethral irritation. He was sent to the ED again for urinary retention 12 hours with abdominal pain. He was diagnosed with UTI. A larger Foley catheter was placed and flushed. Large clot has passed and 16 stent was able to pass urine. He was started on Rocephin but was discharged on cefepime and fluconazole 5 more days. He has been admitted for a short-term rehabilitation.  Patient was reported to be up all night and was trying to get up and walk. He is now  noted to have somnolence and wouldn't wake up. Patient is verbally responsive but unable to open eyes.   PAST MEDICAL HISTORY:  Past Medical History  Diagnosis Date  . Seizures ([place])     Onset in Sept 2006  . Cataract   . Hard of hearing   . Renal disorder   . Depression   . HTN  (hypertension)   . BPH (benign prostatic hyperplasia)   . CAD (coronary artery disease)     s/p CABG in 2002  . Hyperlipidemia   . OSA (obstructive sleep apnea)   . Aortic aneurysm ([place])     3 CM in 2007  . Carotid stenosis   . DDD (degenerative disc disease)   . Trigeminal neuralgia   . Lower GI bleed   . Stroke ([place])   . Allergy   . Aortic stenosis      CURRENT MEDICATIONS: Reviewed  Patient's Medications  New Prescriptions   No medications on file  Previous Medications   ASPIRIN EC 81 MG TABLET    Take 1 tablet (81 mg total) by mouth daily.   CEFUROXIME (CEFTIN) 500 MG TABLET    Take 1 tablet (500 mg total) by mouth 2 (two) times daily with a meal.   FINASTERIDE (PROSCAR) 5 MG TABLET    Take 1 tablet (5 mg total) by mouth daily.   FLUCONAZOLE (DIFLUCAN) 100 MG TABLET    Take 1 tablet (100 mg total) by mouth daily.   LEVETIRACETAM (KEPPRA) 500 MG TABLET    Take 1 tablet (500 mg total) by mouth every 12 (twelve) hours.   LORAZEPAM (ATIVAN) 0.5 MG TABLET    Take 1 tablet (0.5 mg total) by mouth every 8 (eight) hours as needed (For Agitation).   METOPROLOL SUCCINATE (TOPROL-XL) 25 MG 24 HR  TABLET    Take 1 tablet (25 mg total) by mouth daily.   POLYETHYLENE GLYCOL (MIRALAX / GLYCOLAX) PACKET    Take 17 g by mouth daily.   QUETIAPINE (SEROQUEL) 25 MG TABLET    Take 1 tablet (25 mg total) by mouth at bedtime.   SENNA-DOCUSATE (SENOKOT-S) 8.6-50 MG TABLET    Take 2 tablets by mouth 2 (two) times daily.   TAMSULOSIN (FLOMAX) 0.4 MG CAPS CAPSULE    Take 1 capsule (0.4 mg total) by mouth daily after supper.   TRAMADOL (ULTRAM) 50 MG TABLET    Take 1 tablet (50 mg total) by mouth every 8 (eight) hours as needed for moderate pain.   TRIAMCINOLONE CREAM (KENALOG) 0.1 %    Apply 1 application topically 2 (two) times daily.  Modified Medications   No medications on file  Discontinued Medications   MIRABEGRON ER (MYRBETRIQ) 25 MG TB24 TABLET    Take 25 mg by mouth daily.   MIRTAZAPINE  (REMERON) 7.5 MG TABLET    Take 1 tablet (7.5 mg total) by mouth at bedtime as needed.    Allergies  Allergen Reactions  . Benadryl [Diphenhydramine Hcl]     "talks out of his head"  . Oxycodone     Feels crazy  . Tylenol [Acetaminophen]     unknown     REVIEW OF SYSTEMS:  Unable to obtain due to somnolence   PHYSICAL EXAMINATION  GENERAL APPEARANCE: Well nourished. In no acute distress. Normal body habitus HEAD: Normal in size and contour. No evidence of trauma EYES: Eyes close.. No blepharitis, entropion or ectropion.  EARS: Pinnae are normal. Patient hears normal voice tunes of the examiner MOUTH : Lips are without lesions.  NECK: supple, trachea midline, no neck masses, no thyroid tenderness, no thyromegaly LYMPHATICS: no LAN in the neck, no supraclavicular LAN RESPIRATORY: breathing is even & unlabored, BS CTAB CARDIAC: RRR, no murmur,no extra heart sounds, no edema GI: abdomen soft, normal BS, no masses, no tenderness, no hepatomegaly, no splenomegaly GU:  Has foley catheter draining yellowish urine EXTREMITIES:  Unable to test due to somnolence PSYCHIATRIC: Somnolent  LABS/RADIOLOGY: Labs reviewed: Basic Metabolic Panel:  Recent Labs  04/06/15 1643  04/26/15 0404 04/27/15 0400 04/28/15 0411 04/29/15 0408  NA 137  < > 142 136 142 141  K 4.3  < > 4.1 4.1 3.8 4.0  CL 104  < > 109 107 110 106  CO2 28  < > 28 22 27 29   GLUCOSE 103*  < > 99 86 176* 113*  BUN 17  < > 21* 19 17 13   CREATININE 1.35*  < > 1.15 1.14 1.09 1.02  CALCIUM 9.1  < > 8.8* 8.1* 8.6* 8.5*  MG 2.0  --  1.8  --   --   --   PHOS  --   --  4.0  --   --   --   < > = values in this interval not displayed. Liver Function Tests:  Recent Labs  04/10/15 0528 04/11/15 0850 04/26/15 0404  AST 14* 17 20  ALT 11* 13* 22  ALKPHOS 52 51 56  BILITOT 0.5 0.4 0.8  PROT 5.9* 6.2* 6.5  ALBUMIN 2.7* 2.9* 2.8*    Recent Labs  02/08/15 1640  LIPASE 39    Recent Labs  04/09/15 0410  AMMONIA  32   CBC:  Recent Labs  04/11/15 0850 04/23/15 2153 04/25/15 2302 04/26/15 0404 04/27/15 0400 04/28/15 0411  WBC 5.7 9.1  9.8 8.7 8.3 9.2  NEUTROABS 3.1 6.2 6.4  --   --   --   HGB 11.8* 12.0* 10.7* 10.7* 10.7* 10.9*  HCT 35.5* 36.8* 33.1* 33.0* 33.0* 33.7*  MCV 93.9 96.6 96.8 97.9 99.1 96.0  PLT 203 254 228 228 198 210   A1C: Invalid input(s): A1C Lipid Panel:  Recent Labs  02/08/15 0614  HDL 39*   Cardiac Enzymes:  Recent Labs  02/08/15 0249 02/08/15 1117 04/07/15 0705  TROPONINI <0.03 <0.03 <0.03   BNP: Invalid input(s): POCBNP CBG:  Recent Labs  02/12/15 0631 04/26/15 2338 04/27/15 0001  GLUCAP 111* 68 99      Dg Chest 2 View  04/26/2015  CLINICAL DATA:  Abdominal distention EXAM: CHEST  2 VIEW COMPARISON:  04/06/2015 chest radiograph. FINDINGS: Median sternotomy wires are aligned and intact. Coronary artery stent overlies the left heart. CABG clips are seen throughout the mediastinum. Low lung volumes. Stable cardiomediastinal silhouette with mild cardiomegaly. No pneumothorax. Increased small left pleural effusion. No right pleural effusion. No overt pulmonary edema. Patchy opacities in both lower lobes, slightly increased. IMPRESSION: 1. Stable mild cardiomegaly without overt pulmonary edema. 2. Increased small left pleural effusion. 3. Low lung volumes. Mild patchy bibasilar lung opacities, slightly increased, favor atelectasis. Electronically Signed   By: Ilona Sorrel M.D.   On: 04/26/2015 09:58   Dg Chest 2 View  04/06/2015  CLINICAL DATA:  [place] today.  Confusion. EXAM: CHEST  2 VIEW COMPARISON:  04/03/2015 FINDINGS: The heart is enlarged but stable. Stable surgical changes from bypass surgery. Low lung volumes with vascular crowding and streaky basilar atelectasis. No definite infiltrates or effusions. The bony thorax is intact. Stable remote compression fracture of L1. IMPRESSION: Stable cardiac enlargement. Low lung volumes with vascular crowding  and streaky bibasilar atelectasis. No definite infiltrates, effusions or edema. Electronically Signed   By: Marijo Sanes M.D.   On: 04/06/2015 16:09   Dg Chest 2 View  04/03/2015  CLINICAL DATA:  81 year old male presents with generalized weakeness x 3-4 weeks w/ decreased body temp of 91. Hx of CABG, seizure, CAD, HTN, EXAM: CHEST  2 VIEW COMPARISON:  02/07/2015 FINDINGS: Lateral view degraded by patient arm position. Prior median sternotomy. Mild vertebral body height loss of an Upper lumbar or lumbosacral junction body, chronic. Midline trachea. Numerous leads and wires project over the chest. Moderate cardiomegaly. No pleural effusion or pneumothorax. No congestive failure. Clear lungs. IMPRESSION: Cardiomegaly, without acute disease. Electronically Signed   By: Abigail Miyamoto M.D.   On: 04/03/2015 19:06   Dg Abd 1 View  04/26/2015  CLINICAL DATA:  Abdominal distention EXAM: ABDOMEN - 1 VIEW COMPARISON:  04/11/2015 abdominal radiograph FINDINGS: No disproportionately dilated small bowel loops. Mild to moderate stool throughout the visualized colon. No evidence of pneumatosis or pneumoperitoneum. Mild degenerative changes in the lumbar spine. No pathologic soft tissue calcifications. IMPRESSION: Nonobstructive bowel gas pattern.  Mild-to-moderate colonic stool. Electronically Signed   By: Ilona Sorrel M.D.   On: 04/26/2015 09:51   Ct Head Wo Contrast  04/08/2015  CLINICAL DATA:  Acute onset confusion and altered mental status EXAM: CT HEAD WITHOUT CONTRAST TECHNIQUE: Contiguous axial images were obtained from the base of the skull through the vertex without intravenous contrast. COMPARISON:  April 06, 2015 FINDINGS: There is mild generalized atrophy 3 there is no intracranial mass, hemorrhage, extra-axial fluid collection, or midline shift. Patchy small vessel disease in the centra semiovale bilaterally is stable. There is a prior lacunar  infarct in the left thalamus, stable. There is a prior small  lacunar infarct at the genu of the right internal capsule, stable. No acute infarct evident. Bony calvarium appears intact. The mastoid air cells are clear. There is a stable retention cyst in right maxillary antrum. IMPRESSION: Stable atrophy with periventricular small vessel disease. Prior small infarcts in the left thalamus and right internal capsule are stable. No acute infarct evident. No mass or hemorrhage. Stable retention cyst in right maxillary antrum. Electronically Signed   By: Lowella Grip III M.D.   On: 04/08/2015 15:13   Ct Head Wo Contrast  04/06/2015  CLINICAL DATA:  Fall.  Confusion. EXAM: CT HEAD WITHOUT CONTRAST CT CERVICAL SPINE WITHOUT CONTRAST TECHNIQUE: Multidetector CT imaging of the head and cervical spine was performed following the standard protocol without intravenous contrast. Multiplanar CT image reconstructions of the cervical spine were also generated. COMPARISON:  02/07/2015 head and cervical spine CT. FINDINGS: CT HEAD FINDINGS No evidence of parenchymal hemorrhage or extra-axial fluid collection. No mass lesion, mass effect, or midline shift. No CT evidence of acute infarction. There is intracranial atherosclerosis. There is stable diffuse cerebral volume loss. There is stable encephalomalacia in the right temporal lobe with stable ex vacuo dilatation of the temporal horn of the right lateral ventricle. Stable lacunar infarct in the left thalamus. Nonspecific stable subcortical and periventricular white matter hypodensity, most in keeping with chronic small vessel ischemic change. No acute ventriculomegaly. Stable mucous retention cyst versus polyp in the posterior inferior right maxillary sinus, decreased in size since 12/20/2012. The visualized paranasal sinuses are otherwise essentially clear. The mastoid air cells are unopacified. No evidence of calvarial fracture. CT CERVICAL SPINE FINDINGS No fracture or acute subluxation is detected in the cervical spine. No  prevertebral soft tissue swelling. There is straightening of the cervical spine, usually due to positioning and/or muscle spasm. Dens is well positioned between the lateral masses of C1. The lateral masses appear well-aligned. Marked degenerative disc disease in the mid to lower cervical spine, most prominent at C5-6. Marked bilateral facet arthropathy, asymmetric to the left. Stable degenerative 3 mm anterolisthesis at C4-5 and C5-6. Mild degenerative foraminal stenosis on the left at C3-4 and bilaterally at C4-5. Visualized mastoid air cells appear clear. No evidence of intra-axial hemorrhage in the visualized brain. No gross cervical canal hematoma. No acute consolidative airspace disease or significant pulmonary nodules at the visualized lung apices. No appreciable cervical adenopathy or other cervical spine soft tissue abnormality. IMPRESSION: 1.  No evidence of acute intracranial abnormality. 2. Stable intracranial atherosclerosis, diffuse cerebral volume loss, right temporal lobe encephalomalacia, left colonic infarct and chronic small vessel ischemic white matter change. 3. No fracture or acute malalignment in the cervical spine. 4. Marked degenerative changes in the cervical spine as described. Electronically Signed   By: Ilona Sorrel M.D.   On: 04/06/2015 16:34   Ct Cervical Spine Wo Contrast  04/06/2015  CLINICAL DATA:  Fall.  Confusion. EXAM: CT HEAD WITHOUT CONTRAST CT CERVICAL SPINE WITHOUT CONTRAST TECHNIQUE: Multidetector CT imaging of the head and cervical spine was performed following the standard protocol without intravenous contrast. Multiplanar CT image reconstructions of the cervical spine were also generated. COMPARISON:  02/07/2015 head and cervical spine CT. FINDINGS: CT HEAD FINDINGS No evidence of parenchymal hemorrhage or extra-axial fluid collection. No mass lesion, mass effect, or midline shift. No CT evidence of acute infarction. There is intracranial atherosclerosis. There is  stable diffuse cerebral volume loss. There is stable encephalomalacia in  the right temporal lobe with stable ex vacuo dilatation of the temporal horn of the right lateral ventricle. Stable lacunar infarct in the left thalamus. Nonspecific stable subcortical and periventricular white matter hypodensity, most in keeping with chronic small vessel ischemic change. No acute ventriculomegaly. Stable mucous retention cyst versus polyp in the posterior inferior right maxillary sinus, decreased in size since 12/20/2012. The visualized paranasal sinuses are otherwise essentially clear. The mastoid air cells are unopacified. No evidence of calvarial fracture. CT CERVICAL SPINE FINDINGS No fracture or acute subluxation is detected in the cervical spine. No prevertebral soft tissue swelling. There is straightening of the cervical spine, usually due to positioning and/or muscle spasm. Dens is well positioned between the lateral masses of C1. The lateral masses appear well-aligned. Marked degenerative disc disease in the mid to lower cervical spine, most prominent at C5-6. Marked bilateral facet arthropathy, asymmetric to the left. Stable degenerative 3 mm anterolisthesis at C4-5 and C5-6. Mild degenerative foraminal stenosis on the left at C3-4 and bilaterally at C4-5. Visualized mastoid air cells appear clear. No evidence of intra-axial hemorrhage in the visualized brain. No gross cervical canal hematoma. No acute consolidative airspace disease or significant pulmonary nodules at the visualized lung apices. No appreciable cervical adenopathy or other cervical spine soft tissue abnormality. IMPRESSION: 1.  No evidence of acute intracranial abnormality. 2. Stable intracranial atherosclerosis, diffuse cerebral volume loss, right temporal lobe encephalomalacia, left colonic infarct and chronic small vessel ischemic white matter change. 3. No fracture or acute malalignment in the cervical spine. 4. Marked degenerative changes in the  cervical spine as described. Electronically Signed   By: Ilona Sorrel M.D.   On: 04/06/2015 16:34   Ct Abdomen Pelvis W Contrast  04/03/2015  CLINICAL DATA:  81 year old male with weakness and abdominal pain EXAM: CT ABDOMEN AND PELVIS WITH CONTRAST TECHNIQUE: Multidetector CT imaging of the abdomen and pelvis was performed using the standard protocol following bolus administration of intravenous contrast. CONTRAST:  70mL OMNIPAQUE IOHEXOL 300 MG/ML SOLN, 175mL OMNIPAQUE IOHEXOL 300 MG/ML SOLN COMPARISON:  Prior CT abdomen/ pelvis 02/08/2015 FINDINGS: Lower Chest: The visualized lower lungs are clear. Stable borderline cardiomegaly. Atherosclerotic calcification of the aortic valve. No pericardial effusion. Unremarkable distal thoracic esophagus. Abdomen: Unremarkable CT appearance of the stomach, duodenum, spleen, adrenal glands and pancreas. Normal hepatic contour morphology. There are a few tiny circumscribed hypo intense lesions likely representing small cysts or biliary hamartomas. Cholelithiasis without secondary findings to suggest acute cholecystitis. No intra or extrahepatic biliary ductal dilatation. Multiple circumscribed water attenuation cysts arising from both kidneys. The largest is stable at 9.7 cm exophytic from the lower pole of the left kidney. No enhancing renal mass or hydronephrosis. Colonic diverticular disease without CT evidence of active inflammation. No evidence of obstruction or focal bowel wall thickening. Normal appendix in the right lower quadrant. The terminal ileum is unremarkable. No free fluid or suspicious adenopathy. Pelvis: A Foley catheter is present within the bladder. Additionally, there are 2 small bladder stones. The bladder wall is circumferentially mildly thickened and there is a suggestion of haziness and stranding in the perivesicular fat. Massive prostatomegaly again noted. The prostate gland measures 7 cm in diameter. No free fluid or suspicious adenopathy.  Bones/Soft Tissues: Bilateral fat containing inguinal hernias. No acute fracture or aggressive appearing lytic or blastic osseous lesion. Stable T12 and L2 compression fractures without interval progression of height loss. Multilevel degenerative disc disease. Stable grade 1 anterolisthesis of L4 on L5. Vascular: Irregular and ulcerated fibro fatty  plaque versus wall adherent mural thrombus in the abdominal aorta. There is fusiform aneurysmal dilatation of the infrarenal abdominal aorta with a maximal diameter of 4.5 cm which is unchanged compared to the relatively recent prior imaging. Scattered atherosclerotic vascular calcifications. IMPRESSION: 1. Mild circumferential thickening of the bladder wall is nonspecific in the setting of underdistention and massive prostatomegaly. This may simply represent bladder wall trabeculation from chronic bladder outlet obstruction. However, acute cystitis would be difficult to exclude entirely. 2. Two small bladder stones.  A Foley catheter is also present. 3. Mild cardiomegaly with calcification of the aortic valve. Query clinical history of aortic stenosis? 4. Cholelithiasis without secondary findings to suggest acute cholecystitis. 5. Colonic diverticular disease without CT evidence of active inflammation. 6. Multilevel degenerative disc disease and stable grade 1 anterolisthesis of L4 on L5. 7. Stable T12 and L2 compression fractures. 8. Stable 4.5 cm infrarenal abdominal aortic aneurysm. Recommend followup by abdomen and pelvis CTA in 6 months, and vascular surgery referral/consultation if not already obtained. This recommendation follows ACR consensus guidelines: White Paper of the ACR Incidental Findings Committee II on Vascular Findings. J Am Coll Radiol 2013; 10:789-794. Electronically Signed   By: Jacqulynn Cadet M.D.   On: 04/03/2015 20:30   Dg Abd 2 Views  04/11/2015  CLINICAL DATA:  Abdominal pain EXAM: ABDOMEN - 2 VIEW COMPARISON:  CT abdomen and pelvis  04/03/2015 FINDINGS: Retained barium in distal colon and rectum. Sigmoid and descending colonic diverticulosis. Nonobstructive bowel gas pattern. No bowel dilatation, bowel wall thickening or free intraperitoneal air. Diffuse osseous demineralization with compression fractures of T12 and L2. Degenerative disc and facet disease changes lumbar spine. Atherosclerotic calcifications including at a segment of aneurysmal dilatation of the distal abdominal aorta, better visualized and measured on prior CT. Questionable tiny nonobstructing inferior pole RIGHT renal calculus. IMPRESSION: No evidence of bowel obstruction or perforation. Distal colonic diverticulosis. Abdominal aortic aneurysm, please refer to recent CT abdomen/pelvis exam. Compression fractures of T12 and L2. Question tiny nonobstructing RIGHT renal calculus. Electronically Signed   By: Lavonia Dana M.D.   On: 04/11/2015 09:49    ASSESSMENT/PLAN:  Generalized weakness - for rehabilitation  UTI - continue Ceftin 500 mg 1 tab by mouth twice a day and Diflucan 100 mg 1 tab by mouth daily 5 days  Somnolence - check CBC and CMP stat; hold Seroquel and Ativan for sedation; neurochecks every shift 1 week  Seizure  - continue Keppra 500 mg 1 tab by mouth every 12 hours  Anxiety - hold Ativan for sedation  Constipation - continue senna S2 tabs by mouth twice a day and MiraLAX 17 g by mouth daily  BPH with urinary retention - discontinue Myrbetriq; continue on a catheter  Hypertension - controlled; continue Toprol-XL 25 mg 1 tab by mouth daily  Depression - discontinue Remeron when necessary  CAD - stable; continue aspirin 81 mg by mouth daily     Goals of care:  Short-term rehabilitation    [facility], NP [facility] [phone]

## 2015-05-01 NOTE — Progress Notes (Addendum)
  Phone conversation with daughter [name] on 05/01/2015 at 4:30 PM  Daughter Laurey Morale called the office to ask if patient Ceftin and fluconazole could be filled in. Apparently patient was discharged to skilled nursing facility from the hospital but per the daughter she did not like the care he received that and signed him out from there and took him home. Patient wasn't described any antibiotic prescription (I suspect it was because he signed out). As per Dr. Almeta Monas discharge from 11/29 patient was supposed to be on antibiotic until 12/3. Daughter has scheduled an appointment with patient's PCP for tomorrow afternoon. Patient had received 5 days of antibiotic while in the hospital already. I have instructed her to follow-up with appointment and her PCP can decide on further course and duration of antibiotic.  Semone Orlov  [facility] Phone : [phone]

## 2015-05-02 ENCOUNTER — Emergency Department (HOSPITAL_COMMUNITY): Payer: Medicare Other

## 2015-05-02 ENCOUNTER — Inpatient Hospital Stay (HOSPITAL_COMMUNITY)
Admission: EM | Admit: 2015-05-02 | Discharge: 2015-05-07 | DRG: 698 | Disposition: A | Discharge status: Skilled Nursing Facility | Payer: Medicare Other | Attending: Internal Medicine | Admitting: Internal Medicine

## 2015-05-02 ENCOUNTER — Encounter (HOSPITAL_COMMUNITY): Payer: Self-pay | Admitting: Emergency Medicine

## 2015-05-02 DIAGNOSIS — T83518A Infection and inflammatory reaction due to other urinary catheter, initial encounter: Secondary | ICD-10-CM | POA: Diagnosis not present

## 2015-05-02 DIAGNOSIS — H9190 Unspecified hearing loss, unspecified ear: Secondary | ICD-10-CM | POA: Diagnosis present

## 2015-05-02 DIAGNOSIS — R569 Unspecified convulsions: Secondary | ICD-10-CM

## 2015-05-02 DIAGNOSIS — I10 Essential (primary) hypertension: Secondary | ICD-10-CM | POA: Diagnosis present

## 2015-05-02 DIAGNOSIS — E162 Hypoglycemia, unspecified: Secondary | ICD-10-CM | POA: Diagnosis not present

## 2015-05-02 DIAGNOSIS — Y846 Urinary catheterization as the cause of abnormal reaction of the patient, or of later complication, without mention of misadventure at the time of the procedure: Secondary | ICD-10-CM | POA: Diagnosis present

## 2015-05-02 DIAGNOSIS — I2510 Atherosclerotic heart disease of native coronary artery without angina pectoris: Secondary | ICD-10-CM | POA: Diagnosis present

## 2015-05-02 DIAGNOSIS — Z809 Family history of malignant neoplasm, unspecified: Secondary | ICD-10-CM

## 2015-05-02 DIAGNOSIS — F419 Anxiety disorder, unspecified: Secondary | ICD-10-CM | POA: Diagnosis present

## 2015-05-02 DIAGNOSIS — Z888 Allergy status to other drugs, medicaments and biological substances status: Secondary | ICD-10-CM

## 2015-05-02 DIAGNOSIS — Z8249 Family history of ischemic heart disease and other diseases of the circulatory system: Secondary | ICD-10-CM

## 2015-05-02 DIAGNOSIS — A419 Sepsis, unspecified organism: Secondary | ICD-10-CM | POA: Diagnosis present

## 2015-05-02 DIAGNOSIS — J9811 Atelectasis: Secondary | ICD-10-CM | POA: Diagnosis present

## 2015-05-02 DIAGNOSIS — G4733 Obstructive sleep apnea (adult) (pediatric): Secondary | ICD-10-CM | POA: Diagnosis present

## 2015-05-02 DIAGNOSIS — E785 Hyperlipidemia, unspecified: Secondary | ICD-10-CM | POA: Diagnosis present

## 2015-05-02 DIAGNOSIS — I5042 Chronic combined systolic (congestive) and diastolic (congestive) heart failure: Secondary | ICD-10-CM | POA: Diagnosis present

## 2015-05-02 DIAGNOSIS — Z885 Allergy status to narcotic agent status: Secondary | ICD-10-CM

## 2015-05-02 DIAGNOSIS — N400 Enlarged prostate without lower urinary tract symptoms: Secondary | ICD-10-CM | POA: Diagnosis present

## 2015-05-02 DIAGNOSIS — F32A Depression, unspecified: Secondary | ICD-10-CM | POA: Diagnosis present

## 2015-05-02 DIAGNOSIS — M5136 Other intervertebral disc degeneration, lumbar region: Secondary | ICD-10-CM | POA: Diagnosis present

## 2015-05-02 DIAGNOSIS — Z8673 Personal history of transient ischemic attack (TIA), and cerebral infarction without residual deficits: Secondary | ICD-10-CM

## 2015-05-02 DIAGNOSIS — G9340 Encephalopathy, unspecified: Secondary | ICD-10-CM | POA: Diagnosis present

## 2015-05-02 DIAGNOSIS — Z951 Presence of aortocoronary bypass graft: Secondary | ICD-10-CM

## 2015-05-02 DIAGNOSIS — F0150 Vascular dementia without behavioral disturbance: Secondary | ICD-10-CM | POA: Diagnosis present

## 2015-05-02 DIAGNOSIS — Z66 Do not resuscitate: Secondary | ICD-10-CM | POA: Diagnosis present

## 2015-05-02 DIAGNOSIS — Z7982 Long term (current) use of aspirin: Secondary | ICD-10-CM

## 2015-05-02 DIAGNOSIS — E872 Acidosis: Secondary | ICD-10-CM | POA: Diagnosis present

## 2015-05-02 DIAGNOSIS — F329 Major depressive disorder, single episode, unspecified: Secondary | ICD-10-CM | POA: Diagnosis present

## 2015-05-02 DIAGNOSIS — R4182 Altered mental status, unspecified: Secondary | ICD-10-CM | POA: Diagnosis not present

## 2015-05-02 DIAGNOSIS — N390 Urinary tract infection, site not specified: Secondary | ICD-10-CM | POA: Diagnosis present

## 2015-05-02 DIAGNOSIS — Z515 Encounter for palliative care: Secondary | ICD-10-CM

## 2015-05-02 DIAGNOSIS — I714 Abdominal aortic aneurysm, without rupture: Secondary | ICD-10-CM | POA: Diagnosis present

## 2015-05-02 DIAGNOSIS — R627 Adult failure to thrive: Secondary | ICD-10-CM | POA: Diagnosis present

## 2015-05-02 DIAGNOSIS — Z79899 Other long term (current) drug therapy: Secondary | ICD-10-CM

## 2015-05-02 DIAGNOSIS — Z7189 Other specified counseling: Secondary | ICD-10-CM

## 2015-05-02 LAB — BASIC METABOLIC PANEL
ANION GAP: 5 (ref 5–15)
BUN: 13 mg/dL (ref 6–20)
CALCIUM: 8.9 mg/dL (ref 8.9–10.3)
CO2: 30 mmol/L (ref 22–32)
Chloride: 108 mmol/L (ref 101–111)
Creatinine, Ser: 1.09 mg/dL (ref 0.61–1.24)
GFR, EST NON AFRICAN AMERICAN: 55 mL/min — AB (ref >60)
GLUCOSE: 104 mg/dL — AB (ref 65–99)
Potassium: 3.9 mmol/L (ref 3.5–5.1)
Sodium: 143 mmol/L (ref 135–145)

## 2015-05-02 LAB — CBC WITH DIFFERENTIAL/PLATELET
BASOS ABS: 0 10*3/uL (ref 0.0–0.1)
BASOS PCT: 0 %
EOS ABS: 0.8 10*3/uL — AB (ref 0.0–0.7)
EOS PCT: 9 %
HCT: 35.6 % — ABNORMAL LOW (ref 39.0–52.0)
Hemoglobin: 11.4 g/dL — ABNORMAL LOW (ref 13.0–17.0)
Lymphocytes Relative: 21 %
Lymphs Abs: 1.7 10*3/uL (ref 0.7–4.0)
MCH: 30.9 pg (ref 26.0–34.0)
MCHC: 32 g/dL (ref 30.0–36.0)
MCV: 96.5 fL (ref 78.0–100.0)
MONO ABS: 0.6 10*3/uL (ref 0.1–1.0)
Monocytes Relative: 7 %
Neutro Abs: 5.1 10*3/uL (ref 1.7–7.7)
Neutrophils Relative %: 63 %
PLATELETS: 246 10*3/uL (ref 150–400)
RBC: 3.69 MIL/uL — ABNORMAL LOW (ref 4.22–5.81)
RDW: 14.1 % (ref 11.5–15.5)
WBC: 8.2 10*3/uL (ref 4.0–10.5)

## 2015-05-02 LAB — URINALYSIS, ROUTINE W REFLEX MICROSCOPIC
Glucose, UA: NEGATIVE mg/dL
KETONES UR: NEGATIVE mg/dL
NITRITE: NEGATIVE
PH: 6 (ref 5.0–8.0)
PROTEIN: 30 mg/dL — AB
Specific Gravity, Urine: 1.023 (ref 1.005–1.030)

## 2015-05-02 LAB — I-STAT CG4 LACTIC ACID, ED
Lactic Acid, Venous: 1.43 mmol/L (ref 0.5–2.0)
Lactic Acid, Venous: 2.13 mmol/L (ref 0.5–2.0)

## 2015-05-02 LAB — URINE MICROSCOPIC-ADD ON: BACTERIA UA: NONE SEEN

## 2015-05-02 LAB — I-STAT TROPONIN, ED: TROPONIN I, POC: 0.01 ng/mL (ref 0.00–0.08)

## 2015-05-02 MED ORDER — HYDROMORPHONE HCL 1 MG/ML IJ SOLN
0.5000 mg | Freq: Once | INTRAMUSCULAR | Status: AC
  Administered 2015-05-02: 0.5 mg via INTRAVENOUS
  Filled 2015-05-02: qty 1

## 2015-05-02 MED ORDER — LIDOCAINE HCL 2 % EX GEL
1.0000 "application " | Freq: Once | CUTANEOUS | Status: AC
  Administered 2015-05-02: 1 via URETHRAL
  Filled 2015-05-02: qty 11

## 2015-05-02 NOTE — ED Notes (Signed)
Bed: [id number] Expected date:  Expected time:  Means of arrival:  Comments: Ems-bladder issue

## 2015-05-02 NOTE — ED Provider Notes (Signed)
CSN: [id number]     Arrival date & time 05/02/15  1540 History   First MD Initiated Contact with Patient 05/02/15 1547     Chief Complaint  Patient presents with  . Blood in urinary catheter      chronic foley use  . Altered Mental Status     (Consider location/radiation/quality/duration/timing/severity/associated sxs/prior Treatment) HPI Comments: 81 y.o. Male with history of HTN, seizures, CKD, BPH, altered mental status, delirium presents from primary care physician's office for concern for clogged urinary catheter.  The patient was seen here recently for the same issue.  Initially the complaint was just regarding the urinary catheter but after that was addressed family (stepdaughter and wife) reported that the patient has been talking out of his head and has not been making sense which is new today and they say has never happened before.  They report that they are not comfortable with and cannot take him home.  The patient was recently admitted with UTI and was discharged to a nursing facility but the family signed him out of the facility because they did not like the care he was receiving there.   Past Medical History  Diagnosis Date  . Seizures ([place])     Onset in Sept 2006  . Cataract   . Hard of hearing   . Renal disorder   . Depression   . HTN (hypertension)   . BPH (benign prostatic hyperplasia)   . CAD (coronary artery disease)     s/p CABG in 2002  . Hyperlipidemia   . OSA (obstructive sleep apnea)   . Aortic aneurysm ([place])     3 CM in 2007  . Carotid stenosis   . DDD (degenerative disc disease)   . Trigeminal neuralgia   . Lower GI bleed   . Stroke ([place])   . Allergy   . Aortic stenosis    Past Surgical History  Procedure Laterality Date  . Coronary artery bypass graft  2002  . Temporal artery biopsy / ligation  2004   Family History  Problem Relation Age of Onset  . CAD Brother   . Heart disease Brother   . Cancer Mother    Social History  Substance Use  Topics  . Smoking status: Never Smoker   . Smokeless tobacco: Never Used  . Alcohol Use: No    Review of Systems  Unable to perform ROS: Mental status change      Allergies  Benadryl; Oxycodone; and Tylenol  Home Medications   Prior to Admission medications   Medication Sig Start Date End Date Taking? Authorizing Provider  aspirin EC 81 MG tablet Take 1 tablet (81 mg total) by mouth daily. 02/13/15  Yes Shanker Kristeen Mans, MD  cefUROXime (CEFTIN) 500 MG tablet Take 1 tablet (500 mg total) by mouth 2 (two) times daily with a meal. 04/29/15  Yes Verlee Monte, MD  finasteride (PROSCAR) 5 MG tablet Take 1 tablet (5 mg total) by mouth daily. 02/10/15  Yes Shanker Kristeen Mans, MD  fluconazole (DIFLUCAN) 100 MG tablet Take 1 tablet (100 mg total) by mouth daily. 04/29/15  Yes Verlee Monte, MD  levETIRAcetam (KEPPRA) 500 MG tablet Take 1 tablet (500 mg total) by mouth every 12 (twelve) hours. 01/21/15  Yes Benjamin Cartner, PA-C  LORazepam (ATIVAN) 0.5 MG tablet Take 1 tablet (0.5 mg total) by mouth every 8 (eight) hours as needed (For Agitation). Patient taking differently: Take 0.5 mg by mouth every 8 (eight) hours as needed (For Agitation).  Hold for sedation 04/29/15  Yes Verlee Monte, MD  metoprolol succinate (TOPROL-XL) 25 MG 24 hr tablet Take 1 tablet (25 mg total) by mouth daily. 02/10/15  Yes Shanker Kristeen Mans, MD  polyethylene glycol (MIRALAX / GLYCOLAX) packet Take 17 g by mouth daily. 04/11/15  Yes Lavina Hamman, MD  QUEtiapine (SEROQUEL) 25 MG tablet Take 1 tablet (25 mg total) by mouth at bedtime. Patient taking differently: Take 25 mg by mouth at bedtime. Hold for sedation 04/29/15  Yes Verlee Monte, MD  tamsulosin (FLOMAX) 0.4 MG CAPS capsule Take 1 capsule (0.4 mg total) by mouth daily after supper. 02/10/15  Yes Shanker Kristeen Mans, MD  traMADol (ULTRAM) 50 MG tablet Take 1 tablet (50 mg total) by mouth every 8 (eight) hours as needed for moderate pain. 04/29/15  Yes Verlee Monte, MD   triamcinolone cream (KENALOG) 0.1 % Apply 1 application topically 2 (two) times daily.   Yes Historical Provider, MD  senna-docusate (SENOKOT-S) 8.6-50 MG tablet Take 2 tablets by mouth 2 (two) times daily. Patient not taking: Reported on 05/02/2015 04/12/15   Lavina Hamman, MD   BP 136/49 mmHg  Pulse 71  Temp(Src) 98.6 F (37 C) (Rectal)  Resp 18  SpO2 98% Physical Exam  Constitutional: He appears distressed (uncomfortable at time of evaluation).  HENT:  Head: Normocephalic and atraumatic.  Right Ear: External ear normal.  Left Ear: External ear normal.  Mouth/Throat: Oropharyngeal exudate present.  Eyes: Pupils are equal, round, and reactive to light.  Neck: Normal range of motion. Neck supple.  Cardiovascular: Normal rate and intact distal pulses.   Pulmonary/Chest: Effort normal. No respiratory distress.  Abdominal: Soft. He exhibits distension (suprapubic region). There is tenderness (suprapubic). There is no guarding.  Genitourinary:  Foley catheter in place but not able to be flushed and not draining.  Catheter removed and patient with passage of large clots and bright red blood by penis on examination.    Musculoskeletal: He exhibits no edema or tenderness.  Neurological: He is alert.  Patient oriented to self.  Moving all extremities, able to stand up out of bed.  Following basic commands  Skin: Skin is warm and dry. He is not diaphoretic.  Vitals reviewed.   ED Course  BLADDER CATHETERIZATION Date/Time: 05/02/2015 10:51 PM Performed by: Lonia Skinner ROE Authorized by: Harvel Quale Patient identity confirmed: arm band Indications: catheter change Local anesthesia used: yes Anesthesia: local infiltration Local anesthetic: Lido Jet. Patient sedated: no Preparation: Patient was prepped and draped in the usual sterile fashion. Catheter insertion: indwelling Catheter type: triple lumen Catheter size: 24 Fr Complicated insertion: no Altered anatomy:  no Bladder irrigation: yes Number of attempts: 1 Urine volume: 300 ml Urine characteristics: blood-tinged Patient tolerance: Patient tolerated the procedure well with no immediate complications Comments: Initial urine blood tinged but cleared quickly   (including critical care time) Labs Review Labs Reviewed  URINALYSIS, ROUTINE W REFLEX MICROSCOPIC (NOT AT [facility]) - Abnormal; Notable for the following:    Color, Urine RED (*)    APPearance CLOUDY (*)    Hgb urine dipstick LARGE (*)    Bilirubin Urine SMALL (*)    Protein, ur 30 (*)    Leukocytes, UA MODERATE (*)    All other components within normal limits  CBC WITH DIFFERENTIAL/PLATELET - Abnormal; Notable for the following:    RBC 3.69 (*)    Hemoglobin 11.4 (*)    HCT 35.6 (*)    Eosinophils Absolute 0.8 (*)  All other components within normal limits  BASIC METABOLIC PANEL - Abnormal; Notable for the following:    Glucose, Bld 104 (*)    GFR calc non Af Amer 55 (*)    All other components within normal limits  URINE MICROSCOPIC-ADD ON - Abnormal; Notable for the following:    Squamous Epithelial / LPF 0-5 (*)    All other components within normal limits  I-STAT CG4 LACTIC ACID, ED - Abnormal; Notable for the following:    Lactic Acid, Venous 2.13 (*)    All other components within normal limits  URINE CULTURE  CULTURE, BLOOD (ROUTINE X 2)  CULTURE, BLOOD (ROUTINE X 2)  LEVETIRACETAM LEVEL  BRAIN NATRIURETIC PEPTIDE  I-STAT TROPOININ, ED  I-STAT CG4 LACTIC ACID, ED    Imaging Review Dg Chest 2 View  05/02/2015  CLINICAL DATA:  Mental status change EXAM: CHEST  2 VIEW COMPARISON:  04/26/2015 chest radiograph. FINDINGS: Median sternotomy wires are aligned and intact. CABG clips overlie the mediastinum. Stable cardiomediastinal silhouette with mild cardiomegaly. No pneumothorax. Stable small left pleural effusion. Low lung volumes. No overt pulmonary edema. Patchy opacity at the left greater than right lung bases appears  stable. IMPRESSION: 1. Low lung volumes. Patchy opacities at the left greater than right lung bases appear stable, favor atelectasis, cannot exclude a component of aspiration or pneumonia. 2. Stable small left pleural effusion. 3. Stable mild cardiomegaly without overt pulmonary edema . Electronically Signed   By: Ilona Sorrel M.D.   On: 05/02/2015 18:58   Ct Head Wo Contrast  05/02/2015  CLINICAL DATA:  Altered mental status. EXAM: CT HEAD WITHOUT CONTRAST TECHNIQUE: Contiguous axial images were obtained from the base of the skull through the vertex without intravenous contrast. COMPARISON:  04/08/2015 head CT. FINDINGS: No evidence of parenchymal hemorrhage or extra-axial fluid collection. No mass lesion, mass effect, or midline shift. No CT evidence of acute infarction. Intracranial atherosclerosis. Nonspecific subcortical and periventricular white matter hypodensity, most in keeping with chronic small vessel ischemic change. Stable lacunar infarcts in the left thalamus and right internal capsule. Stable encephalomalacia in the right temporal lobe with ex vacuo dilatation of the temporal horn of the right lateral ventricle. No acute ventriculomegaly. Stable mucous retention cyst versus polyp in the right maxillary sinus. The mastoid air cells are unopacified. No evidence of calvarial fracture. IMPRESSION: 1.  No evidence of acute intracranial abnormality. 2. Intracranial atherosclerosis, old right internal capsule and left thalamic lacunes, right temporal lobe encephalomalacia and chronic small vessel ischemic white matter change. 3. Stable mucous retention cyst versus polyp in the right maxillary sinus. Electronically Signed   By: Ilona Sorrel M.D.   On: 05/02/2015 18:48   I have personally reviewed and evaluated these images and lab results as part of my medical decision-making.   EKG Interpretation   Date/Time:  Friday May 02 2015 18:22:30 EST Ventricular Rate:  76 PR Interval:  140 QRS  Duration: 131 QT Interval:  418 QTC Calculation: 470 R Axis:   47 Text Interpretation:  Sinus rhythm Right bundle branch block Repol abnrm  suggests ischemia, diffuse leads Baseline wander in lead(s) V3 since last  tracing no significant change Confirmed by Eulis Foster  MD, Vira Agar [id number]) on  05/02/2015 6:27:32 PM      MDM  Patient seen and evaluated in stable condition.  Patient intermittently not making sense.  Patient's wife aggressive and irritated about fact that she wants patient admitted and does not feel comfortable taking him home.  Initial  catheter issue discussed with urology who recommended oral antibiotics and outpatient follow up.  As family reported increased mental status changes and confusion further work up was completed which was unremarkable for patient other than increased repeat lactic acid.  Care management and SW were consulted but unavailable.  Concern for family's ability to care for the patient and reported increased confusion.  Discussed with Dr. Blaine Hamper who agreed with admission and patient was admitted to telemetry under his care.  Patient started on gentamicin for UTI in light of complicated previous cultures.   Final diagnoses:  Altered mental status, unspecified altered mental status type    1. Altered mental status  2. UTI    Harvel Quale, MD 05/03/15 [phone]

## 2015-05-02 NOTE — ED Notes (Signed)
Per EMS, patient is arriving from doctors office.  Patient has blood in his catheter and reduce urine output.  Doctor's office requested we flush catheter, per EMS.???    BP:  138/70 HR:82 R:19  CBG:174

## 2015-05-03 DIAGNOSIS — E872 Acidosis: Secondary | ICD-10-CM | POA: Diagnosis present

## 2015-05-03 DIAGNOSIS — N400 Enlarged prostate without lower urinary tract symptoms: Secondary | ICD-10-CM | POA: Diagnosis present

## 2015-05-03 DIAGNOSIS — A419 Sepsis, unspecified organism: Secondary | ICD-10-CM | POA: Diagnosis present

## 2015-05-03 DIAGNOSIS — Z8673 Personal history of transient ischemic attack (TIA), and cerebral infarction without residual deficits: Secondary | ICD-10-CM | POA: Diagnosis not present

## 2015-05-03 DIAGNOSIS — R569 Unspecified convulsions: Secondary | ICD-10-CM | POA: Diagnosis present

## 2015-05-03 DIAGNOSIS — F419 Anxiety disorder, unspecified: Secondary | ICD-10-CM | POA: Diagnosis present

## 2015-05-03 DIAGNOSIS — N390 Urinary tract infection, site not specified: Secondary | ICD-10-CM | POA: Diagnosis present

## 2015-05-03 DIAGNOSIS — F329 Major depressive disorder, single episode, unspecified: Secondary | ICD-10-CM | POA: Diagnosis present

## 2015-05-03 DIAGNOSIS — I10 Essential (primary) hypertension: Secondary | ICD-10-CM | POA: Diagnosis present

## 2015-05-03 DIAGNOSIS — Z885 Allergy status to narcotic agent status: Secondary | ICD-10-CM | POA: Diagnosis not present

## 2015-05-03 DIAGNOSIS — F0150 Vascular dementia without behavioral disturbance: Secondary | ICD-10-CM | POA: Diagnosis present

## 2015-05-03 DIAGNOSIS — Z951 Presence of aortocoronary bypass graft: Secondary | ICD-10-CM | POA: Diagnosis not present

## 2015-05-03 DIAGNOSIS — I5042 Chronic combined systolic (congestive) and diastolic (congestive) heart failure: Secondary | ICD-10-CM | POA: Diagnosis present

## 2015-05-03 DIAGNOSIS — Z7189 Other specified counseling: Secondary | ICD-10-CM | POA: Diagnosis not present

## 2015-05-03 DIAGNOSIS — E785 Hyperlipidemia, unspecified: Secondary | ICD-10-CM | POA: Diagnosis present

## 2015-05-03 DIAGNOSIS — Z8249 Family history of ischemic heart disease and other diseases of the circulatory system: Secondary | ICD-10-CM | POA: Diagnosis not present

## 2015-05-03 DIAGNOSIS — Z7982 Long term (current) use of aspirin: Secondary | ICD-10-CM | POA: Diagnosis not present

## 2015-05-03 DIAGNOSIS — R627 Adult failure to thrive: Secondary | ICD-10-CM | POA: Diagnosis present

## 2015-05-03 DIAGNOSIS — R400 Somnolence: Secondary | ICD-10-CM | POA: Diagnosis not present

## 2015-05-03 DIAGNOSIS — Z79899 Other long term (current) drug therapy: Secondary | ICD-10-CM | POA: Diagnosis not present

## 2015-05-03 DIAGNOSIS — T83518A Infection and inflammatory reaction due to other urinary catheter, initial encounter: Secondary | ICD-10-CM | POA: Diagnosis present

## 2015-05-03 DIAGNOSIS — G4733 Obstructive sleep apnea (adult) (pediatric): Secondary | ICD-10-CM | POA: Diagnosis present

## 2015-05-03 DIAGNOSIS — H9190 Unspecified hearing loss, unspecified ear: Secondary | ICD-10-CM | POA: Diagnosis present

## 2015-05-03 DIAGNOSIS — Z888 Allergy status to other drugs, medicaments and biological substances status: Secondary | ICD-10-CM | POA: Diagnosis not present

## 2015-05-03 DIAGNOSIS — I2510 Atherosclerotic heart disease of native coronary artery without angina pectoris: Secondary | ICD-10-CM | POA: Diagnosis present

## 2015-05-03 DIAGNOSIS — R4182 Altered mental status, unspecified: Secondary | ICD-10-CM | POA: Diagnosis present

## 2015-05-03 DIAGNOSIS — Z66 Do not resuscitate: Secondary | ICD-10-CM | POA: Diagnosis present

## 2015-05-03 DIAGNOSIS — Z515 Encounter for palliative care: Secondary | ICD-10-CM | POA: Diagnosis not present

## 2015-05-03 DIAGNOSIS — J9811 Atelectasis: Secondary | ICD-10-CM | POA: Diagnosis present

## 2015-05-03 DIAGNOSIS — I714 Abdominal aortic aneurysm, without rupture: Secondary | ICD-10-CM | POA: Diagnosis present

## 2015-05-03 DIAGNOSIS — Z809 Family history of malignant neoplasm, unspecified: Secondary | ICD-10-CM | POA: Diagnosis not present

## 2015-05-03 DIAGNOSIS — M5136 Other intervertebral disc degeneration, lumbar region: Secondary | ICD-10-CM | POA: Diagnosis present

## 2015-05-03 DIAGNOSIS — Y846 Urinary catheterization as the cause of abnormal reaction of the patient, or of later complication, without mention of misadventure at the time of the procedure: Secondary | ICD-10-CM | POA: Diagnosis present

## 2015-05-03 DIAGNOSIS — E162 Hypoglycemia, unspecified: Secondary | ICD-10-CM | POA: Diagnosis not present

## 2015-05-03 DIAGNOSIS — G9340 Encephalopathy, unspecified: Secondary | ICD-10-CM | POA: Diagnosis not present

## 2015-05-03 LAB — COMPREHENSIVE METABOLIC PANEL
ALBUMIN: 2.2 g/dL — AB (ref 3.5–5.0)
ALT: 24 U/L (ref 17–63)
ANION GAP: 2 — AB (ref 5–15)
AST: 16 U/L (ref 15–41)
Alkaline Phosphatase: 105 U/L (ref 38–126)
BUN: 12 mg/dL (ref 6–20)
CHLORIDE: 107 mmol/L (ref 101–111)
CO2: 31 mmol/L (ref 22–32)
Calcium: 8.1 mg/dL — ABNORMAL LOW (ref 8.9–10.3)
Creatinine, Ser: 1.04 mg/dL (ref 0.61–1.24)
GFR calc non Af Amer: 58 mL/min — ABNORMAL LOW (ref >60)
GLUCOSE: 107 mg/dL — AB (ref 65–99)
Potassium: 3.5 mmol/L (ref 3.5–5.1)
SODIUM: 140 mmol/L (ref 135–145)
Total Bilirubin: 0.8 mg/dL (ref 0.3–1.2)
Total Protein: 5.7 g/dL — ABNORMAL LOW (ref 6.5–8.1)

## 2015-05-03 LAB — GLUCOSE, CAPILLARY: GLUCOSE-CAPILLARY: 79 mg/dL (ref 65–99)

## 2015-05-03 LAB — PROTIME-INR
INR: 1.14 (ref 0.00–1.49)
PROTHROMBIN TIME: 14.7 s (ref 11.6–15.2)

## 2015-05-03 LAB — CBC
HCT: 31.3 % — ABNORMAL LOW (ref 39.0–52.0)
HEMOGLOBIN: 10.2 g/dL — AB (ref 13.0–17.0)
MCH: 31.7 pg (ref 26.0–34.0)
MCHC: 32.6 g/dL (ref 30.0–36.0)
MCV: 97.2 fL (ref 78.0–100.0)
Platelets: 223 10*3/uL (ref 150–400)
RBC: 3.22 MIL/uL — AB (ref 4.22–5.81)
RDW: 14 % (ref 11.5–15.5)
WBC: 6.6 10*3/uL (ref 4.0–10.5)

## 2015-05-03 LAB — LACTIC ACID, PLASMA
LACTIC ACID, VENOUS: 1.1 mmol/L (ref 0.5–2.0)
LACTIC ACID, VENOUS: 0.9 mmol/L (ref 0.5–2.0)

## 2015-05-03 LAB — BRAIN NATRIURETIC PEPTIDE: B NATRIURETIC PEPTIDE 5: 361.5 pg/mL — AB (ref 0.0–100.0)

## 2015-05-03 LAB — MRSA PCR SCREENING: MRSA by PCR: NEGATIVE

## 2015-05-03 LAB — APTT: APTT: 24 s (ref 24–37)

## 2015-05-03 LAB — PROCALCITONIN: Procalcitonin: 0.12 ng/mL

## 2015-05-03 MED ORDER — QUETIAPINE FUMARATE 25 MG PO TABS
25.0000 mg | ORAL_TABLET | Freq: Every day | ORAL | Status: DC
  Administered 2015-05-03 – 2015-05-06 (×3): 25 mg via ORAL
  Filled 2015-05-03 (×4): qty 1

## 2015-05-03 MED ORDER — SULFAMETHOXAZOLE-TRIMETHOPRIM 400-80 MG/5ML IV SOLN
300.0000 mg | Freq: Two times a day (BID) | INTRAVENOUS | Status: DC
  Administered 2015-05-03 – 2015-05-04 (×2): 300 mg via INTRAVENOUS
  Filled 2015-05-03 (×2): qty 18.75
  Filled 2015-05-03: qty 18.8

## 2015-05-03 MED ORDER — HEPARIN SODIUM (PORCINE) 5000 UNIT/ML IJ SOLN
5000.0000 [IU] | Freq: Three times a day (TID) | INTRAMUSCULAR | Status: DC
  Administered 2015-05-03 – 2015-05-04 (×5): 5000 [IU] via SUBCUTANEOUS
  Filled 2015-05-03 (×6): qty 1

## 2015-05-03 MED ORDER — FINASTERIDE 5 MG PO TABS
5.0000 mg | ORAL_TABLET | Freq: Every day | ORAL | Status: DC
  Administered 2015-05-04 – 2015-05-07 (×4): 5 mg via ORAL
  Filled 2015-05-03 (×5): qty 1

## 2015-05-03 MED ORDER — SODIUM CHLORIDE 0.9 % IV SOLN
500.0000 mg | Freq: Two times a day (BID) | INTRAVENOUS | Status: DC
  Administered 2015-05-03 – 2015-05-07 (×8): 500 mg via INTRAVENOUS
  Filled 2015-05-03 (×10): qty 5

## 2015-05-03 MED ORDER — SODIUM CHLORIDE 0.9 % IV BOLUS (SEPSIS)
1000.0000 mL | Freq: Once | INTRAVENOUS | Status: AC
  Administered 2015-05-03: 1000 mL via INTRAVENOUS

## 2015-05-03 MED ORDER — ONDANSETRON HCL 4 MG/2ML IJ SOLN: 4.0000 mg | Freq: Four times a day (QID) | INTRAMUSCULAR | Status: DC | PRN

## 2015-05-03 MED ORDER — TAMSULOSIN HCL 0.4 MG PO CAPS
0.4000 mg | ORAL_CAPSULE | Freq: Every day | ORAL | Status: DC
  Administered 2015-05-03 – 2015-05-06 (×4): 0.4 mg via ORAL
  Filled 2015-05-03 (×5): qty 1

## 2015-05-03 MED ORDER — SODIUM CHLORIDE 0.9 % IV SOLN
500.0000 mg | Freq: Once | INTRAVENOUS | Status: DC
  Administered 2015-05-03: 500 mg via INTRAVENOUS
  Filled 2015-05-03: qty 5

## 2015-05-03 MED ORDER — SODIUM CHLORIDE 0.9 % IV SOLN
INTRAVENOUS | Status: DC
  Administered 2015-05-03 – 2015-05-04 (×4): via INTRAVENOUS

## 2015-05-03 MED ORDER — ASPIRIN EC 81 MG PO TBEC
81.0000 mg | DELAYED_RELEASE_TABLET | Freq: Every day | ORAL | Status: DC
  Administered 2015-05-04 – 2015-05-07 (×4): 81 mg via ORAL
  Filled 2015-05-03 (×5): qty 1

## 2015-05-03 MED ORDER — SODIUM CHLORIDE 0.9 % IV SOLN: 500.0000 mg | Freq: Two times a day (BID) | INTRAVENOUS | Status: DC

## 2015-05-03 MED ORDER — GENTAMICIN SULFATE 40 MG/ML IJ SOLN
360.0000 mg | Freq: Once | INTRAVENOUS | Status: AC
  Administered 2015-05-03: 360 mg via INTRAVENOUS
  Filled 2015-05-03: qty 9

## 2015-05-03 MED ORDER — TRIAMCINOLONE ACETONIDE 0.1 % EX CREA
1.0000 | TOPICAL_CREAM | Freq: Two times a day (BID) | CUTANEOUS | Status: DC
Start: 2015-05-03 — End: 2015-05-07
  Administered 2015-05-03 – 2015-05-06 (×7): 1 via TOPICAL
  Filled 2015-05-03: qty 15

## 2015-05-03 MED ORDER — ONDANSETRON HCL 4 MG PO TABS: 4.0000 mg | ORAL_TABLET | Freq: Four times a day (QID) | ORAL | Status: DC | PRN

## 2015-05-03 MED ORDER — POLYETHYLENE GLYCOL 3350 17 G PO PACK: 17.0000 g | PACK | Freq: Every day | ORAL | Status: DC | PRN

## 2015-05-03 MED ORDER — TRAMADOL HCL 50 MG PO TABS
50.0000 mg | ORAL_TABLET | Freq: Three times a day (TID) | ORAL | Status: DC | PRN
  Administered 2015-05-05: 50 mg via ORAL
  Filled 2015-05-03: qty 1

## 2015-05-03 MED ORDER — LEVETIRACETAM 500 MG PO TABS
500.0000 mg | ORAL_TABLET | Freq: Two times a day (BID) | ORAL | Status: DC
  Filled 2015-05-03 (×2): qty 1

## 2015-05-03 MED ORDER — METOPROLOL SUCCINATE ER 25 MG PO TB24
25.0000 mg | ORAL_TABLET | Freq: Every day | ORAL | Status: DC
  Administered 2015-05-04 – 2015-05-07 (×4): 25 mg via ORAL
  Filled 2015-05-03 (×4): qty 1

## 2015-05-03 MED ORDER — LORAZEPAM 0.5 MG PO TABS
0.5000 mg | ORAL_TABLET | Freq: Three times a day (TID) | ORAL | Status: DC | PRN
  Administered 2015-05-05 (×2): 0.5 mg via ORAL
  Filled 2015-05-03 (×2): qty 1

## 2015-05-03 MED ORDER — SODIUM CHLORIDE 0.9 % IJ SOLN
3.0000 mL | Freq: Two times a day (BID) | INTRAMUSCULAR | Status: DC
  Administered 2015-05-03 – 2015-05-06 (×5): 3 mL via INTRAVENOUS

## 2015-05-03 MED ORDER — METOPROLOL TARTRATE 1 MG/ML IV SOLN: 5.0000 mg | INTRAVENOUS | Status: DC | PRN

## 2015-05-03 NOTE — ED Notes (Signed)
Catheter leg strap removed, replaced with stat lock. Patient urine was initially tea colored, upon placement of the stat lock the urine began to lighten. MD aware. Patient repositioned for comfort. Bed alarm remains on. Warm blankets applied.

## 2015-05-03 NOTE — Progress Notes (Signed)
ANTIBIOTIC CONSULT NOTE - INITIAL  Pharmacy Consult for Gentamicin Indication: UTI  Allergies  Allergen Reactions  . Benadryl [Diphenhydramine Hcl]     "talks out of his head"  . Oxycodone     Feels crazy  . Tylenol [Acetaminophen]     unknown    Patient Measurements:   Wt=73 kg  Vital Signs: Temp: 98.6 F (37 C) (12/02 2358) Temp Source: Rectal (12/02 2358) BP: 136/49 mmHg (12/02 2358) Pulse Rate: 71 (12/02 2358) Intake/Output from previous day: 12/02 0701 - 12/03 0700 In: -  Out: 300 [Urine:300] Intake/Output from this shift: Total I/O In: -  Out: 300 [Urine:300]  Labs:  Recent Labs  05/02/15 1724  WBC 8.2  HGB 11.4*  PLT 246  CREATININE 1.09   Estimated Creatinine Clearance: 39.6 mL/min (by C-G formula based on Cr of 1.09). No results for input(s): VANCOTROUGH, VANCOPEAK, VANCORANDOM, GENTTROUGH, GENTPEAK, GENTRANDOM, TOBRATROUGH, TOBRAPEAK, TOBRARND, AMIKACINPEAK, AMIKACINTROU, AMIKACIN in the last 72 hours.   Microbiology: Recent Results (from the past 720 hour(s))  Urine culture     Status: None   Collection Time: 04/06/15  4:30 PM  Result Value Ref Range Status   Specimen Description URINE, CLEAN CATCH  Final   Special Requests NONE  Final   Culture   Final    >=100,000 COLONIES/mL STAPHYLOCOCCUS SPECIES (COAGULASE NEGATIVE) Performed at [facility]    Report Status 04/09/2015 FINAL  Final   Organism ID, Bacteria STAPHYLOCOCCUS SPECIES (COAGULASE NEGATIVE)  Final      Susceptibility   Staphylococcus species (coagulase negative) - MIC*    CIPROFLOXACIN >=8 RESISTANT Resistant     GENTAMICIN <=0.5 SENSITIVE Sensitive     NITROFURANTOIN <=16 SENSITIVE Sensitive     OXACILLIN <=0.25 SENSITIVE Sensitive     TETRACYCLINE >=16 RESISTANT Resistant     VANCOMYCIN <=0.5 SENSITIVE Sensitive     TRIMETH/SULFA <=10 SENSITIVE Sensitive     CLINDAMYCIN <=0.25 SENSITIVE Sensitive     RIFAMPIN <=0.5 SENSITIVE Sensitive     Inducible Clindamycin  NEGATIVE Sensitive     * >=100,000 COLONIES/mL STAPHYLOCOCCUS SPECIES (COAGULASE NEGATIVE)  Blood culture (routine x 2)     Status: None   Collection Time: 04/06/15  4:46 PM  Result Value Ref Range Status   Specimen Description BLOOD RIGHT ARM  Final   Special Requests BOTTLES DRAWN AEROBIC AND ANAEROBIC 5CC  Final   Culture   Final    NO GROWTH 5 DAYS Performed at [facility]    Report Status 04/11/2015 FINAL  Final  Blood culture (routine x 2)     Status: None   Collection Time: 04/06/15  4:46 PM  Result Value Ref Range Status   Specimen Description RIGHT ANTECUBITAL  Final   Special Requests BOTTLES DRAWN AEROBIC AND ANAEROBIC 5CC  Final   Culture   Final    NO GROWTH 5 DAYS Performed at [facility]    Report Status 04/11/2015 FINAL  Final  MRSA PCR Screening     Status: None   Collection Time: 04/07/15  7:05 PM  Result Value Ref Range Status   MRSA by PCR NEGATIVE NEGATIVE Final    Comment:        The GeneXpert MRSA Assay (FDA approved for NASAL specimens only), is one component of a comprehensive MRSA colonization surveillance program. It is not intended to diagnose MRSA infection nor to guide or monitor treatment for MRSA infections.   Urine culture     Status: None   Collection  Time: 04/23/15 10:14 PM  Result Value Ref Range Status   Specimen Description URINE, CATHETERIZED  Final   Special Requests NONE  Final   Culture   Final    NO GROWTH 2 DAYS Performed at [facility]    Report Status 04/25/2015 FINAL  Final  Urine culture     Status: None   Collection Time: 04/25/15 10:07 PM  Result Value Ref Range Status   Specimen Description URINE, CATHETERIZED  Final   Special Requests Normal  Final   Culture   Final    NO GROWTH 1 DAY Performed at [facility]    Report Status 04/27/2015 FINAL  Final    Medical History: Past Medical History  Diagnosis Date  . Seizures ([place])     Onset in Sept 2006  . Cataract   . Hard of  hearing   . Renal disorder   . Depression   . HTN (hypertension)   . BPH (benign prostatic hyperplasia)   . CAD (coronary artery disease)     s/p CABG in 2002  . Hyperlipidemia   . OSA (obstructive sleep apnea)   . Aortic aneurysm ([place])     3 CM in 2007  . Carotid stenosis   . DDD (degenerative disc disease)   . Trigeminal neuralgia   . Lower GI bleed   . Stroke ([place])   . Allergy   . Aortic stenosis     Medications:   (Not in a hospital admission) Scheduled:  . aspirin EC  81 mg Oral Daily  . finasteride  5 mg Oral Daily  . heparin  5,000 Units Subcutaneous 3 times per day  . levETIRAcetam  500 mg Oral Q12H  . metoprolol succinate  25 mg Oral Daily  . QUEtiapine  25 mg Oral QHS  . sodium chloride  3 mL Intravenous Q12H  . tamsulosin  0.4 mg Oral QPC supper  . triamcinolone cream  1 application Topical BID   Infusions:  . sodium chloride    . gentamicin    . sodium chloride     Assessment: 75 yoM with altered mental status found to have UTI.  Gentamcin per Rx for UTI.   Goal of Therapy:  Gentamicin trough level <2 mcg/ml  Per [place] Nom[facility]  Plan:   Gentamicin 360mg  x1  10 hr random gent level  F/u maintenance dose per nomogram  Lawana Pai R 05/03/2015,1:04 AM

## 2015-05-03 NOTE — Progress Notes (Signed)
Report received from A. [place],RN. No change in assessment. Larry Bradford 

## 2015-05-03 NOTE — H&P (Addendum)
[facility] History and Physical  Larry Bradford [id number] DOB: [birth year] DOA: 05/02/2015  Referring physician: ED physician PCP: Doristine Devoid, MD  Specialists:   Chief Complaint: AMS  HPI: Larry Bradford is a 81 y.o. male with PMH of Dwelling Foley catheter, Seizures ([place]); Cataract; Hard of hearing; Renal disorder; Depression; HTN (hypertension); BPH (benign prostatic hyperplasia); CAD (coronary artery disease); Hyperlipidemia; OSA (obstructive sleep apnea); Aortic aneurysm ([place]); Carotid stenosis; DDD (degenerative disc disease); Trigeminal neuralgia; Lower GI bleed; Stroke [facility]); Allergy; and Aortic stenosis, who presents with AMS.  Patient has AMS and is unable to provide accurate medical history, therefore, most of the history is obtained by discussing the case with ED physician, per EMS report, and with the nursing staff. The history is very limited.   It seems that pt was recently admitted with UTI from 11/25 to 11/29. He was discharged to a nursing facility,  but the family signed him out of the facility because they did not like the care he was receiving there. Pt is noted to be confused today. He has been talking out of his head and has not been making sense per family. Patient was noticed to have blood in his catheter and reduce urine output.They report that they are not comfortable with and cannot take him home. When I saw pt in ED, patient is disoriented, does not talk to me. Not sure whether patient has any pain anywhere. He moves all extremities. No cough or diarrhea observed. Dwelling catheter was changed in ED.  In ED, patient was found to have positive urinalysis with moderate leukocytes, WBC 8.2, temperature normal, lactate 1.43--> 2.13, tachycardia, tachypnea. CT head negative for acute abnormalities. CXR showed patchy opacities at the left greater than right lung bases appear stable, favor atelectasis, though cannot exclude a component of aspiration or  pneumonia. Patient is admitted to inpatient for further eval and treatment.  Where does patient live?   At home  Can patient participate in ADLs?  None   Review of Systems: Could not be reviewed due to altered mental status.  Allergy:  Allergies  Allergen Reactions  . Benadryl [Diphenhydramine Hcl]     "talks out of his head"  . Oxycodone     Feels crazy  . Tylenol [Acetaminophen]     unknown    Past Medical History  Diagnosis Date  . Seizures ([place])     Onset in Sept 2006  . Cataract   . Hard of hearing   . Renal disorder   . Depression   . HTN (hypertension)   . BPH (benign prostatic hyperplasia)   . CAD (coronary artery disease)     s/p CABG in 2002  . Hyperlipidemia   . OSA (obstructive sleep apnea)   . Aortic aneurysm ([place])     3 CM in 2007  . Carotid stenosis   . DDD (degenerative disc disease)   . Trigeminal neuralgia   . Lower GI bleed   . Stroke ([place])   . Allergy   . Aortic stenosis     Past Surgical History  Procedure Laterality Date  . Coronary artery bypass graft  2002  . Temporal artery biopsy / ligation  2004    Social History:  reports that he has never smoked. He has never used smokeless tobacco. He reports that he does not drink alcohol or use illicit drugs.  Family History:  Family History  Problem Relation Age of Onset  . CAD Brother   . Heart  disease Brother   . Cancer Mother      Prior to Admission medications   Medication Sig Start Date End Date Taking? Authorizing Provider  aspirin EC 81 MG tablet Take 1 tablet (81 mg total) by mouth daily. 02/13/15  Yes Shanker Kristeen Mans, MD  cefUROXime (CEFTIN) 500 MG tablet Take 1 tablet (500 mg total) by mouth 2 (two) times daily with a meal. 04/29/15  Yes Verlee Monte, MD  finasteride (PROSCAR) 5 MG tablet Take 1 tablet (5 mg total) by mouth daily. 02/10/15  Yes Shanker Kristeen Mans, MD  fluconazole (DIFLUCAN) 100 MG tablet Take 1 tablet (100 mg total) by mouth daily. 04/29/15  Yes Verlee Monte, MD   levETIRAcetam (KEPPRA) 500 MG tablet Take 1 tablet (500 mg total) by mouth every 12 (twelve) hours. 01/21/15  Yes Benjamin Cartner, PA-C  LORazepam (ATIVAN) 0.5 MG tablet Take 1 tablet (0.5 mg total) by mouth every 8 (eight) hours as needed (For Agitation). Patient taking differently: Take 0.5 mg by mouth every 8 (eight) hours as needed (For Agitation). Hold for sedation 04/29/15  Yes Verlee Monte, MD  metoprolol succinate (TOPROL-XL) 25 MG 24 hr tablet Take 1 tablet (25 mg total) by mouth daily. 02/10/15  Yes Shanker Kristeen Mans, MD  polyethylene glycol (MIRALAX / GLYCOLAX) packet Take 17 g by mouth daily. 04/11/15  Yes Lavina Hamman, MD  QUEtiapine (SEROQUEL) 25 MG tablet Take 1 tablet (25 mg total) by mouth at bedtime. Patient taking differently: Take 25 mg by mouth at bedtime. Hold for sedation 04/29/15  Yes Verlee Monte, MD  tamsulosin (FLOMAX) 0.4 MG CAPS capsule Take 1 capsule (0.4 mg total) by mouth daily after supper. 02/10/15  Yes Shanker Kristeen Mans, MD  traMADol (ULTRAM) 50 MG tablet Take 1 tablet (50 mg total) by mouth every 8 (eight) hours as needed for moderate pain. 04/29/15  Yes Verlee Monte, MD  triamcinolone cream (KENALOG) 0.1 % Apply 1 application topically 2 (two) times daily.   Yes Historical Provider, MD  senna-docusate (SENOKOT-S) 8.6-50 MG tablet Take 2 tablets by mouth 2 (two) times daily. Patient not taking: Reported on 05/02/2015 04/12/15   Lavina Hamman, MD    Physical Exam: Filed Vitals:   05/02/15 2158 05/02/15 2358 05/03/15 0143 05/03/15 0149  BP: 130/107 136/49  110/75  Pulse: 83 71  112  Temp:  98.6 F (37 C)  97.9 F (36.6 C)  TempSrc:  Rectal  Oral  Resp: 18 18  16   Weight:   71.8 kg (158 lb 4.6 oz)   SpO2: 98% 98%  98%   General: Not in acute distress HEENT:       Eyes: PERRL, EOMI, no scleral icterus.       ENT: No discharge from the ears and nose, no pharynx injection, no tonsillar enlargement.        Neck: No JVD, no bruit, no mass felt. Heme: No  neck lymph node enlargement. Cardiac: S1/S2, RRR, No murmurs, No gallops or rubs. Pulm: No rales, wheezing, rhonchi or rubs. Abd: Soft, nondistended, no organomegaly, BS present. Ext: No pitting leg edema bilaterally. 2+DP/PT pulse bilaterally. Musculoskeletal: No joint deformities, No joint redness or warmth, no limitation of ROM in spin. Skin: No rashes.  Neuro: disoiented X3, cranial nerves II-XII grossly intact, moves all extremities Psych: Could not be reviewed due to altered mental status.  Labs on Admission:  Basic Metabolic Panel:  Recent Labs Lab 04/26/15 0404 04/27/15 0400 04/28/15 0411 04/29/15 0408 05/02/15 1724  NA 142 136 142 141 143  K 4.1 4.1 3.8 4.0 3.9  CL 109 107 110 106 108  CO2 28 22 27 29 30   GLUCOSE 99 86 176* 113* 104*  BUN 21* 19 17 13 13   CREATININE 1.15 1.14 1.09 1.02 1.09  CALCIUM 8.8* 8.1* 8.6* 8.5* 8.9  MG 1.8  --   --   --   --   PHOS 4.0  --   --   --   --    Liver Function Tests:  Recent Labs Lab 04/26/15 0404  AST 20  ALT 22  ALKPHOS 56  BILITOT 0.8  PROT 6.5  ALBUMIN 2.8*   No results for input(s): LIPASE, AMYLASE in the last 168 hours. No results for input(s): AMMONIA in the last 168 hours. CBC:  Recent Labs Lab 04/26/15 0404 04/27/15 0400 04/28/15 0411 05/02/15 1724  WBC 8.7 8.3 9.2 8.2  NEUTROABS  --   --   --  5.1  HGB 10.7* 10.7* 10.9* 11.4*  HCT 33.0* 33.0* 33.7* 35.6*  MCV 97.9 99.1 96.0 96.5  PLT 228 198 210 246   Cardiac Enzymes: No results for input(s): CKTOTAL, CKMB, CKMBINDEX, TROPONINI in the last 168 hours.  BNP (last 3 results)  Recent Labs  02/07/15 2154  BNP 48.2    ProBNP (last 3 results) No results for input(s): PROBNP in the last 8760 hours.  CBG:  Recent Labs Lab 04/26/15 2338 04/27/15 0001  GLUCAP 68 99    Radiological Exams on Admission: Dg Chest 2 View  05/02/2015  CLINICAL DATA:  Mental status change EXAM: CHEST  2 VIEW COMPARISON:  04/26/2015 chest radiograph. FINDINGS:  Median sternotomy wires are aligned and intact. CABG clips overlie the mediastinum. Stable cardiomediastinal silhouette with mild cardiomegaly. No pneumothorax. Stable small left pleural effusion. Low lung volumes. No overt pulmonary edema. Patchy opacity at the left greater than right lung bases appears stable. IMPRESSION: 1. Low lung volumes. Patchy opacities at the left greater than right lung bases appear stable, favor atelectasis, cannot exclude a component of aspiration or pneumonia. 2. Stable small left pleural effusion. 3. Stable mild cardiomegaly without overt pulmonary edema . Electronically Signed   By: Ilona Sorrel M.D.   On: 05/02/2015 18:58   Ct Head Wo Contrast  05/02/2015  CLINICAL DATA:  Altered mental status. EXAM: CT HEAD WITHOUT CONTRAST TECHNIQUE: Contiguous axial images were obtained from the base of the skull through the vertex without intravenous contrast. COMPARISON:  04/08/2015 head CT. FINDINGS: No evidence of parenchymal hemorrhage or extra-axial fluid collection. No mass lesion, mass effect, or midline shift. No CT evidence of acute infarction. Intracranial atherosclerosis. Nonspecific subcortical and periventricular white matter hypodensity, most in keeping with chronic small vessel ischemic change. Stable lacunar infarcts in the left thalamus and right internal capsule. Stable encephalomalacia in the right temporal lobe with ex vacuo dilatation of the temporal horn of the right lateral ventricle. No acute ventriculomegaly. Stable mucous retention cyst versus polyp in the right maxillary sinus. The mastoid air cells are unopacified. No evidence of calvarial fracture. IMPRESSION: 1.  No evidence of acute intracranial abnormality. 2. Intracranial atherosclerosis, old right internal capsule and left thalamic lacunes, right temporal lobe encephalomalacia and chronic small vessel ischemic white matter change. 3. Stable mucous retention cyst versus polyp in the right maxillary sinus.  Electronically Signed   By: Ilona Sorrel M.D.   On: 05/02/2015 18:48    EKG: Independently reviewed.  QTC 470, RBBB.   Assessment/Plan Principal  Problem:   Acute encephalopathy Active Problems:   Hypertension   Seizures (HCC)   Depression   BPH (benign prostatic hyperplasia)   CAD (coronary artery disease)   History of stroke   Altered mental status   UTI (lower urinary tract infection), secondary to chronic indwelling Foley catheter   Sepsis (HCC)   Chronic combined systolic and diastolic congestive heart failure ([place])   Acute encephalopathy: Likely due to sepsis 2/2 UTI which is caused by Dwelling catheter. Patient is hemodynamically stable. -will admit to tele bed -treat UTI and sepsis as below -Frequent neuro check -Consult to SW for possible SNF placement.  UTI and sepsis: patient is septic on admission with tachycardia, elevated lactate. His urine culture on 03/06/15 showed coagulase negative Staphylococcus, which is sensitive to gentamicin. -  IV gentamicin was ordered by EDP, will continue - Follow up results of urine and blood cx and amend antibiotic regimen if needed per sensitivity results - will get Procalcitonin and trend lactic acid levels per sepsis protocol. - IVF: 1L of NS bolus in ED, followed by 100 cc/h (patient has a congestive heart failure, limiting aggressive IV fluids treatment).  Seizure:  -switch oral keppra to IV -check keppra level  CAD: s/p CABG 2002. Dose not seem to have CP. -continue aspirin, metoprolol,  Depression and anxiety: -Continue Seroquel and Ativan when necessary  History of stroke -On aspirin  Combined systolic and diastolic congestive heart failure: 2-D echo on 02/09/15 showed EF of 45-50 percent discrete 1 diastolic dysfunction. Patient is not on diuretics at home. No leg edema. CHF is compensated. -Continue aspirin and metoprolol -Check a BMP  BPH:  - Continue Flomax and proscar   DVT ppx: SCD Code Status: Full  code Family Communication: None at bed side.  Disposition Plan: Admit to inpatient   Date of Service 05/03/2015    Ivor Costa [facility] Pager [phone]  If 7PM-7AM, please contact night-coverage [email or web address] Password [email or web address] 05/03/2015, 3:30 AM

## 2015-05-03 NOTE — Progress Notes (Signed)
Occupational Therapy Evaluation Patient Details Name: Larry Bradford MRN: [id number] DOB: Jun 27, 1916 Today's Date: 05/03/2015    History of Present Illness 81 y.o. male with PMH of Dwelling Foley catheter, Seizures ([place]); Cataract; Hard of hearing; Renal disorder; Depression; HTN (hypertension); BPH (benign prostatic hyperplasia); CAD (coronary artery disease); Hyperlipidemia; OSA (obstructive sleep apnea); Aortic aneurysm ([place]); Carotid stenosis; DDD (degenerative disc disease); Trigeminal neuralgia; Lower GI bleed; Stroke [facility]); Allergy; and Aortic stenosis, who presents with AMS.   Clinical Impression   Patient presents to OT with decreased ADL independence and safety due to the functional limitations listed below. He will benefit from skilled OT to maximize independence and to facilitate a safe discharge plan. OT will follow.    Follow Up Recommendations  SNF;Supervision/Assistance - 24 hour    Equipment Recommendations  None recommended by OT    Recommendations for Other Services       Precautions / Restrictions Precautions Precautions: Fall Restrictions Weight Bearing Restrictions: No      Mobility Bed Mobility Overal bed mobility: Needs Assistance Bed Mobility: Supine to Sit;Sit to Supine     Supine to sit: Mod assist Sit to supine: Mod assist   General bed mobility comments: multi modal cues  Transfers Overall transfer level: Needs assistance Equipment used: Rolling walker (2 wheeled) Transfers: Sit to/from Stand Sit to Stand: Min assist;Mod assist;+2 safety/equipment         General transfer comment: assist to rise and mod assist for steadying due to posteior LOB    Balance                                            ADL Overall ADL's : Needs assistance/impaired Eating/Feeding: Bed level;Minimal assistance               Upper Body Dressing : Maximal assistance;Sitting   Lower Body Dressing: Maximal assistance;Total  assistance;Sit to/from stand               Functional mobility during ADLs: Minimal assistance;Moderate assistance;+2 for physical assistance;+2 for safety/equipment;Rolling walker General ADL Comments: Patient initially sleeping upon arrival but awoke with verbal and tactile stimuli. Very HOH; hears better R ear. Agreeable to OT/PT. Able to get OOB and ambulate a short distance with RW and assistance. Moaning during all mobility tasks. Patient returned to bed at end of session. Has foley catheter.     Vision     Perception     Praxis      Pertinent Vitals/Pain Pain Assessment: Faces Faces Pain Scale: Hurts little more Pain Location: near foley catheter insertion Pain Descriptors / Indicators: Sore Pain Intervention(s): Limited activity within patient's tolerance;Monitored during session     Hand Dominance Right   Extremity/Trunk Assessment Upper Extremity Assessment Upper Extremity Assessment: Generalized weakness   Lower Extremity Assessment Lower Extremity Assessment: Defer to PT evaluation   Cervical / Trunk Assessment Cervical / Trunk Assessment: Kyphotic   Communication Communication Communication: HOH   Cognition Arousal/Alertness: Awake/alert Behavior During Therapy: WFL for tasks assessed/performed Overall Cognitive Status: No family/caregiver present to determine baseline cognitive functioning Area of Impairment: Orientation;Safety/judgement;Following commands;Problem solving Orientation Level: Disoriented to;Situation;Place;Time Current Attention Level: Focused;Sustained   Following Commands: Follows one step commands with increased time Safety/Judgement: Decreased awareness of safety   Problem Solving: Slow processing;Difficulty sequencing;Requires verbal cues;Requires tactile cues General Comments: hears better with Right ear, able to follow simple commands  General Comments       Exercises       Shoulder Instructions      Home Living  Family/patient expects to be discharged to:: Unsure Living Arrangements: Spouse/significant other                               Additional Comments: Patient not accurate historian. Recently was at a SNF but family took him out, then they were unable to manage him at home.      Prior Functioning/Environment Level of Independence: Needs assistance  Gait / Transfers Assistance Needed: Wife reports 4-5 falls on average per day. Does not use his RW. ADL's / Homemaking Assistance Needed: Wife assists with ADLs   Comments: this information was taken from prior recent admission.    OT Diagnosis: Generalized weakness;Acute pain   OT Problem List: Decreased strength;Decreased activity tolerance;Impaired balance (sitting and/or standing);Decreased cognition;Decreased safety awareness;Decreased knowledge of use of DME or AE;Pain   OT Treatment/Interventions: Self-care/ADL training;DME and/or AE instruction;Therapeutic activities;Patient/family education    OT Goals(Current goals can be found in the care plan section) Acute Rehab OT Goals Patient Stated Goal: none stated OT Goal Formulation: Patient unable to participate in goal setting Time For Goal Achievement: 05/17/15 Potential to Achieve Goals: Fair  OT Frequency: Min 2X/week   Barriers to D/C:            Co-evaluation PT/OT/SLP Co-Evaluation/Treatment: Yes Reason for Co-Treatment: Necessary to address cognition/behavior during functional activity;For patient/therapist safety PT goals addressed during session: Mobility/safety with mobility;Proper use of DME OT goals addressed during session: ADL's and self-care;Proper use of Adaptive equipment and DME      End of Session Equipment Utilized During Treatment: Rolling walker Nurse Communication: Mobility status  Activity Tolerance: Patient tolerated treatment well;Patient limited by fatigue;Patient limited by pain Patient left: in bed;with call bell/phone within  reach;with bed alarm set;with nursing/sitter in room   Time: 1525-1541 OT Time Calculation (min): 16 min Charges:  OT General Charges $OT Visit: 1 Procedure OT Evaluation $Initial OT Evaluation Tier I: 1 Procedure G-Codes:    Conard Alvira A 2015/06/02, 4:14 PM

## 2015-05-03 NOTE — Progress Notes (Signed)
Moaning and agitated when disturbed, not open eyes, not following command, vital stable, lactic acidosis resolved, change abx to iv bactrim due coag negative strep in past urine culture, urine culture from this admission pending, it is very difficult to get labs to monitor drug level due to patient's agitation when disturbed, avoid gent or vanc. Patient is not able take po meds, change meds to iv for now, hopefully will improve and able to eat, otherwise consider ng tube.

## 2015-05-03 NOTE — ED Notes (Signed)
Patient given water to drink. When asked if he is hungry he says "no thank you".

## 2015-05-03 NOTE — Evaluation (Signed)
Physical Therapy Evaluation Patient Details Name: Larry Bradford MRN: [id number] DOB: 1916/09/11 Today's Date: 05/03/2015   History of Present Illness  81 y.o. male with PMH of Dwelling Foley catheter, Seizures ([place]); Cataract; Hard of hearing; Renal disorder; Depression; HTN (hypertension); BPH (benign prostatic hyperplasia); CAD (coronary artery disease); Hyperlipidemia; OSA (obstructive sleep apnea); Aortic aneurysm ([place]); Carotid stenosis; DDD (degenerative disc disease); Trigeminal neuralgia; Lower GI bleed; Stroke [facility]); Allergy; and Aortic stenosis, who presents with AMS.  Clinical Impression  Pt admitted with AMS and presenting with functional mobility limitations 2* generalized weakness, ambulatory balance deficits and ongoing cognitive deficits.  Pt would benefit from follow up rehab at SNF level.    Follow Up Recommendations SNF;Supervision/Assistance - 24 hour    Equipment Recommendations  None recommended by PT    Recommendations for Other Services OT consult     Precautions / Restrictions Precautions Precautions: Fall Restrictions Weight Bearing Restrictions: No      Mobility  Bed Mobility Overal bed mobility: Needs Assistance Bed Mobility: Supine to Sit;Sit to Supine     Supine to sit: Mod assist Sit to supine: Mod assist   General bed mobility comments: multi modal cues  Transfers Overall transfer level: Needs assistance Equipment used: Rolling walker (2 wheeled) Transfers: Sit to/from Stand Sit to Stand: Min assist;Mod assist;+2 safety/equipment         General transfer comment: assist to rise and mod assist for steadying due to posteior LOB  Ambulation/Gait Ambulation/Gait assistance: Min assist;Mod assist;+2 safety/equipment Ambulation Distance (Feet): 60 Feet Assistive device: Rolling walker (2 wheeled) Gait Pattern/deviations: Step-through pattern;Decreased step length - right;Decreased step length - left;Shuffle;Trunk flexed Gait velocity:  decreased    General Gait Details: cues for posture and position from RW; Physical assist to balance and RW management  Stairs            Wheelchair Mobility    Modified Rankin (Stroke Patients Only)       Balance     Sitting balance-Leahy Scale: Fair       Standing balance-Leahy Scale: Poor                               Pertinent Vitals/Pain Pain Assessment: Faces Faces Pain Scale: Hurts little more Pain Location: near catherter insert site Pain Descriptors / Indicators: Sore Pain Intervention(s): Limited activity within patient's tolerance;Monitored during session    Home Living Family/patient expects to be discharged to:: Skilled nursing facility Living Arrangements: Spouse/significant other               Additional Comments: Patient not accurate historian. Recently was at a SNF but family took him out, then they were unable to manage him at home.    Prior Function Level of Independence: Needs assistance   Gait / Transfers Assistance Needed: Wife reports 4-5 falls on average per day. Does not use his RW.  ADL's / Homemaking Assistance Needed: Wife assists with ADLs  Comments: this information was taken from prior recent admission.     Hand Dominance   Dominant Hand: Right    Extremity/Trunk Assessment   Upper Extremity Assessment: Generalized weakness           Lower Extremity Assessment: Generalized weakness      Cervical / Trunk Assessment: Kyphotic  Communication   Communication: HOH  Cognition Arousal/Alertness: Awake/alert Behavior During Therapy: WFL for tasks assessed/performed Overall Cognitive Status: No family/caregiver present to determine baseline cognitive functioning Area of  Impairment: Orientation;Safety/judgement;Following commands;Problem solving Orientation Level: Disoriented to;Situation;Place;Time Current Attention Level: Focused;Sustained   Following Commands: Follows one step commands with  increased time Safety/Judgement: Decreased awareness of safety   Problem Solving: Slow processing;Difficulty sequencing;Requires verbal cues;Requires tactile cues General Comments: hears better with Right ear, able to follow simple commands     General Comments      Exercises        Assessment/Plan    PT Assessment Patient needs continued PT services  PT Diagnosis Difficulty walking;Generalized weakness   PT Problem List Decreased strength;Decreased activity tolerance;Decreased balance;Decreased mobility;Decreased safety awareness;Decreased cognition  PT Treatment Interventions DME instruction;Gait training;Functional mobility training;Therapeutic activities;Patient/family education;Therapeutic exercise;Balance training   PT Goals (Current goals can be found in the Care Plan section) Acute Rehab PT Goals Patient Stated Goal: none stated PT Goal Formulation: Patient unable to participate in goal setting Time For Goal Achievement: 05/12/15 Potential to Achieve Goals: Fair    Frequency Min 3X/week   Barriers to discharge        Co-evaluation PT/OT/SLP Co-Evaluation/Treatment: Yes Reason for Co-Treatment: Necessary to address cognition/behavior during functional activity PT goals addressed during session: Mobility/safety with mobility OT goals addressed during session: ADL's and self-care       End of Session Equipment Utilized During Treatment: Gait belt Activity Tolerance: Patient limited by fatigue Patient left: in bed;with call bell/phone within reach;with bed alarm set Nurse Communication: Mobility status         Time: [id number] PT Time Calculation (min) (ACUTE ONLY): 18 min   Charges:   PT Evaluation $Initial PT Evaluation Tier I: 1 Procedure     PT G Codes:        Haley Roza 05/31/2015, 4:32 PM

## 2015-05-04 DIAGNOSIS — F0390 Unspecified dementia without behavioral disturbance: Secondary | ICD-10-CM

## 2015-05-04 DIAGNOSIS — R627 Adult failure to thrive: Secondary | ICD-10-CM

## 2015-05-04 DIAGNOSIS — Z9289 Personal history of other medical treatment: Secondary | ICD-10-CM

## 2015-05-04 LAB — BASIC METABOLIC PANEL
Anion gap: 3 — ABNORMAL LOW (ref 5–15)
BUN: 9 mg/dL (ref 6–20)
CALCIUM: 8.1 mg/dL — AB (ref 8.9–10.3)
CO2: 27 mmol/L (ref 22–32)
CREATININE: 0.9 mg/dL (ref 0.61–1.24)
Chloride: 111 mmol/L (ref 101–111)
GFR calc non Af Amer: >60 mL/min (ref >60)
GLUCOSE: 84 mg/dL (ref 65–99)
Potassium: 3.6 mmol/L (ref 3.5–5.1)
Sodium: 141 mmol/L (ref 135–145)

## 2015-05-04 LAB — MAGNESIUM: Magnesium: 1.6 mg/dL — ABNORMAL LOW (ref 1.7–2.4)

## 2015-05-04 LAB — GLUCOSE, CAPILLARY: Glucose-Capillary: 79 mg/dL (ref 65–99)

## 2015-05-04 LAB — AMMONIA: AMMONIA: 10 umol/L (ref 9–35)

## 2015-05-04 MED ORDER — SULFAMETHOXAZOLE-TRIMETHOPRIM 800-160 MG PO TABS
1.0000 | ORAL_TABLET | Freq: Two times a day (BID) | ORAL | Status: DC
  Administered 2015-05-04 – 2015-05-06 (×4): 1 via ORAL
  Filled 2015-05-04 (×6): qty 1

## 2015-05-04 MED ORDER — MAGNESIUM SULFATE 2 GM/50ML IV SOLN
2.0000 g | Freq: Once | INTRAVENOUS | Status: AC
  Administered 2015-05-04: 2 g via INTRAVENOUS
  Filled 2015-05-04: qty 50

## 2015-05-04 MED ORDER — ENOXAPARIN SODIUM 40 MG/0.4ML ~~LOC~~ SOLN
40.0000 mg | SUBCUTANEOUS | Status: DC
  Administered 2015-05-04 – 2015-05-06 (×3): 40 mg via SUBCUTANEOUS
  Filled 2015-05-04 (×4): qty 0.4

## 2015-05-04 NOTE — Progress Notes (Signed)
PROGRESS NOTE  Larry Bradford [id number] DOB: 1917-05-04 DOA: 05/02/2015 PCP: Doristine Devoid, MD  HPI/Recap of past 24 hours: Awake now, very hard of hearing, know he is in the hospital , reported does not remember time for a long time, following command, nad, urine clear in foley.  Assessment/Plan: Principal Problem:   Acute encephalopathy Active Problems:   Hypertension   Seizures (HCC)   Depression   BPH (benign prostatic hyperplasia)   CAD (coronary artery disease)   History of stroke   Altered mental status   UTI (lower urinary tract infection), secondary to chronic indwelling Foley catheter   Sepsis (HCC)   Chronic combined systolic and diastolic congestive heart failure (HCC)  Confusion: from seizure? No witness seizure. from uti? Urine cloudy, with indwelling foley,  No leukocytosis, no fever, does has mild lactic acid. For now will continue abx, if continue improve will stop abx. Restart oral meds now that patient awake.   Hypomagnesemia: replace mag iv  Seizure:  -switch oral keppra to IV -keppra level pending  CAD: s/p CABG 2002. Dose not seem to have CP. -continue aspirin, metoprolol,  Depression and anxiety: -Continue Seroquel and Ativan when necessary  History of stroke -On aspirin  Combined systolic and diastolic congestive heart failure: 2-D echo on 02/09/15 showed EF of 45-50 percent discrete 1 diastolic dysfunction. Patient is not on diuretics at home. No leg edema. CHF is compensated. -Continue aspirin and metoprolol   BPH:  - Continue Flomax and proscar  Dementia: likely vascular dementia with prior h/o cva    Code Status: full  Family Communication: patient   Disposition Plan: likely back to SNF early next week   Consultants:  none  Procedures:  none  Antibiotics:  Gentx1,  bactrim   Objective: BP 127/55 mmHg  Pulse 76  Temp(Src) 98.4 F (36.9 C) (Oral)  Resp 20  Ht 5\' 10"  (1.778 m)  Wt 158 lb 4.6 oz (71.8  kg)  BMI 22.71 kg/m2  SpO2 100%  Intake/Output Summary (Last 24 hours) at 05/04/15 1459 Last data filed at 05/04/15 1415  Gross per 24 hour  Intake   1910 ml  Output    900 ml  Net   1010 ml   Filed Weights   05/03/15 0143  Weight: 158 lb 4.6 oz (71.8 kg)    Exam:   General:  Frail, more alert, oriented to self and know he is in the hospital  Cardiovascular: RRR  Respiratory: CTABL  Abdomen: Soft/ND/NT, positive BS  Musculoskeletal: No Edema  Neuro: moving all extremities, dementia.  Data Reviewed: Basic Metabolic Panel:  Recent Labs Lab 04/28/15 0411 04/29/15 0408 05/02/15 1724 05/03/15 0415 05/04/15 0750  NA 142 141 143 140 141  K 3.8 4.0 3.9 3.5 3.6  CL 110 106 108 107 111  CO2 27 29 30 31 27   GLUCOSE 176* 113* 104* 107* 84  BUN 17 13 13 12 9   CREATININE 1.09 1.02 1.09 1.04 0.90  CALCIUM 8.6* 8.5* 8.9 8.1* 8.1*  MG  --   --   --   --  1.6*   Liver Function Tests:  Recent Labs Lab 05/03/15 0415  AST 16  ALT 24  ALKPHOS 105  BILITOT 0.8  PROT 5.7*  ALBUMIN 2.2*   No results for input(s): LIPASE, AMYLASE in the last 168 hours.  Recent Labs Lab 05/04/15 0750  AMMONIA 10   CBC:  Recent Labs Lab 04/28/15 0411 05/02/15 1724 05/03/15 0415  WBC 9.2 8.2  6.6  NEUTROABS  --  5.1  --   HGB 10.9* 11.4* 10.2*  HCT 33.7* 35.6* 31.3*  MCV 96.0 96.5 97.2  PLT 210 246 223   Cardiac Enzymes:   No results for input(s): CKTOTAL, CKMB, CKMBINDEX, TROPONINI in the last 168 hours. BNP (last 3 results)  Recent Labs  02/07/15 2154 05/03/15 0415  BNP 48.2 361.5*    ProBNP (last 3 results) No results for input(s): PROBNP in the last 8760 hours.  CBG:  Recent Labs Lab 05/03/15 0729 05/04/15 0804  GLUCAP 79 79    Recent Results (from the past 240 hour(s))  Urine culture     Status: None   Collection Time: 04/25/15 10:07 PM  Result Value Ref Range Status   Specimen Description URINE, CATHETERIZED  Final   Special Requests Normal   Final   Culture   Final    NO GROWTH 1 DAY Performed at [facility]    Report Status 04/27/2015 FINAL  Final  Urine culture     Status: None (Preliminary result)   Collection Time: 05/03/15  1:08 AM  Result Value Ref Range Status   Specimen Description URINE, CATHETERIZED  Final   Special Requests NONE  Final   Culture   Final    NO GROWTH < 24 HOURS Performed at [facility]    Report Status PENDING  Incomplete  Culture, blood (routine x 2)     Status: None (Preliminary result)   Collection Time: 05/03/15  1:08 AM  Result Value Ref Range Status   Specimen Description BLOOD BLOOD RIGHT FOREARM  Final   Special Requests IN PEDIATRIC BOTTLE 3CC  Final   Culture   Final    NO GROWTH 1 DAY Performed at [facility]    Report Status PENDING  Incomplete  Culture, blood (routine x 2)     Status: None (Preliminary result)   Collection Time: 05/03/15  1:12 AM  Result Value Ref Range Status   Specimen Description BLOOD RIGHT ANTECUBITAL  Final   Special Requests IN PEDIATRIC BOTTLE 3CC  Final   Culture   Final    NO GROWTH 1 DAY Performed at [facility]    Report Status PENDING  Incomplete  MRSA PCR Screening     Status: None   Collection Time: 05/03/15  6:12 AM  Result Value Ref Range Status   MRSA by PCR NEGATIVE NEGATIVE Final    Comment:        The GeneXpert MRSA Assay (FDA approved for NASAL specimens only), is one component of a comprehensive MRSA colonization surveillance program. It is not intended to diagnose MRSA infection nor to guide or monitor treatment for MRSA infections.      Studies: No results found.  Scheduled Meds: . aspirin EC  81 mg Oral Daily  . enoxaparin (LOVENOX) injection  40 mg Subcutaneous Q24H  . finasteride  5 mg Oral Daily  . levETIRAcetam  500 mg Intravenous Q12H  . metoprolol succinate  25 mg Oral Daily  . QUEtiapine  25 mg Oral QHS  . sodium chloride  3 mL Intravenous Q12H  .  sulfamethoxazole-trimethoprim  1 tablet Oral Q12H  . tamsulosin  0.4 mg Oral QPC supper  . triamcinolone cream  1 application Topical BID    Continuous Infusions:    Time spent: 61mins  Kasee Hantz MD, PhD  [facility] Pager [phone]. If 7PM-7AM, please contact night-coverage at [email or web address], password [facility] 05/04/2015, 2:59 PM  LOS: 1 day

## 2015-05-05 DIAGNOSIS — Z515 Encounter for palliative care: Secondary | ICD-10-CM

## 2015-05-05 DIAGNOSIS — Z7189 Other specified counseling: Secondary | ICD-10-CM

## 2015-05-05 LAB — URINE CULTURE: Culture: NO GROWTH

## 2015-05-05 LAB — LEVETIRACETAM LEVEL: Levetiracetam Lvl: 9.5 ug/mL — ABNORMAL LOW (ref 10.0–40.0)

## 2015-05-05 LAB — BASIC METABOLIC PANEL
ANION GAP: 4 — AB (ref 5–15)
BUN: 10 mg/dL (ref 6–20)
CHLORIDE: 110 mmol/L (ref 101–111)
CO2: 25 mmol/L (ref 22–32)
Calcium: 8.2 mg/dL — ABNORMAL LOW (ref 8.9–10.3)
Creatinine, Ser: 1.14 mg/dL (ref 0.61–1.24)
GFR calc non Af Amer: 52 mL/min — ABNORMAL LOW (ref >60)
Glucose, Bld: 80 mg/dL (ref 65–99)
POTASSIUM: 4.1 mmol/L (ref 3.5–5.1)
SODIUM: 139 mmol/L (ref 135–145)

## 2015-05-05 LAB — MAGNESIUM: MAGNESIUM: 2 mg/dL (ref 1.7–2.4)

## 2015-05-05 LAB — GLUCOSE, CAPILLARY
GLUCOSE-CAPILLARY: 68 mg/dL (ref 65–99)
Glucose-Capillary: 82 mg/dL (ref 65–99)

## 2015-05-05 MED ORDER — HALOPERIDOL LACTATE 5 MG/ML IJ SOLN
0.5000 mg | Freq: Four times a day (QID) | INTRAMUSCULAR | Status: DC | PRN
Start: 2015-05-05 — End: 2015-05-07
  Administered 2015-05-05: 0.5 mg via INTRAMUSCULAR
  Filled 2015-05-05: qty 1

## 2015-05-05 NOTE — Clinical Social Work Placement (Signed)
   CLINICAL SOCIAL WORK PLACEMENT  NOTE  Date:  05/05/2015  Patient Details  Name: Larry Bradford MRN: [id number] Date of Birth: 1917/03/08  Clinical Social Work is seeking post-discharge placement for this patient at the [place] level of care (*CSW will initial, date and re-position this form in  chart as items are completed):  Yes   Patient/family provided with [place] Work Department's list of facilities offering this level of care within the geographic area requested by the patient (or if unable, by the patient's family).  Yes   Patient/family informed of their freedom to choose among providers that offer the needed level of care, that participate in Medicare, Medicaid or managed care program needed by the patient, have an available bed and are willing to accept the patient.  Yes   Patient/family informed of [facility]'s ownership interest in [facility] and [facility], as well as of the fact that they are under no obligation to receive care at these facilities.  PASRR submitted to EDS on       PASRR number received on       Existing PASRR number confirmed on 05/05/15     FL2 transmitted to all facilities in geographic area requested by pt/family on 05/05/15     FL2 transmitted to all facilities within larger geographic area on       Patient informed that his/her managed care company has contracts with or will negotiate with certain facilities, including the following:            Patient/family informed of bed offers received.  Patient chooses bed at       Physician recommends and patient chooses bed at      Patient to be transferred to   on  .  Patient to be transferred to facility by       Patient family notified on   of transfer.  Name of family member notified:        PHYSICIAN Please sign FL2, Please prepare priority discharge summary, including medications     Additional Comment:     _______________________________________________ Ludwig Clarks, LCSW 05/05/2015, 3:54 PM

## 2015-05-05 NOTE — Consult Note (Signed)
Consultation Note Date: 05/05/2015   Patient Name: Larry Bradford  DOB: 04/21/1917  MRN: [id number]  Age / Sex: 81 y.o., male  PCP: Doristine Devoid, MD Referring Physician: Florencia Reasons, MD  Reason for Consultation: Establishing goals of care and Interfamily conflict    Clinical Assessment/Narrative:  This NP Wadie Lessen reviewed medical records, received report from team, assessed the patient and then spoke by telephone with his son Larry Bradford # [id number] (althought there is no documentation on chart stating he is the decision maker, all family look to him) discuss diagnosis, prognosis, GOC, EOL wishes disposition and options. I also spoke with step-son (Larry Bradford) who supports whatever decision [name] makes.   I offered to meet with [name] anytime at his convenience and he tells me his work keeps him from being able to come to the hospital anytime.  Patient is confused to time and place and does not have decision making capacity at this time.  A  discussion was had today regarding advanced directives.  Concepts specific to code status, artifical feeding and hydration, continued IV antibiotics and rehospitalization was had.  The difference between a aggressive medical intervention path  and a palliative comfort care path for this patient at this time was had.  Values and goals of care important to patient and family were attempted to be elicited.  Concept of Hospice and Palliative Care were discussed.  Concept of failure to thrive was too discussed.  Natural trajectory and expectations at EOL were discussed.  Questions and concerns addressed.  Family encouraged to call with questions or concerns.  PMT will continue to support holistically.   HCPOA: none documented at this point   [place]  -family desires that "everthing" be done to keep this patient alive.  "If he  needs to go to a nursing home that is ok too"  Code Status/Advance Care Planning:  Full code -strongly encouraged to consider DNR status knowing outcomes in similar patients.     Code Status Orders        Start     Ordered   05/03/15 0050  Full code   Continuous     05/03/15 0050    Advance Directive Documentation        Most Recent Value   Type of Advance Directive  Healthcare Power of Attorney   Pre-existing out of facility DNR order (yellow form or pink MOST form)     "MOST" Form in Place?        Other Directives:None   Palliative Prophylaxis:   Bowel Regimen, Delirium Protocol, Frequent Pain Assessment and Oral Care    Psycho-social/Spiritual:  Support System: [place] Desire for further Chaplaincy support:no Additional Recommendations: Education on Hospice  Prognosis: Unable to determine  Discharge Planning: [place] for rehab with Palliative care service follow-up   Chief Complaint/ Primary Diagnoses: Present on Admission:  . UTI (lower urinary tract infection), secondary to chronic indwelling Foley catheter . Hypertension . Depression . CAD (coronary artery disease) . Acute encephalopathy . Altered mental status . Sepsis ([place]) . Chronic combined systolic and diastolic congestive heart failure ([place]) . BPH (benign prostatic hyperplasia)  I have reviewed the medical record, interviewed the patient and family, and examined the patient. The following aspects are pertinent.  Past Medical History  Diagnosis Date  . Seizures ([place])     Onset in Sept 2006  . Cataract   . Hard of hearing   . Renal disorder   .  Depression   . HTN (hypertension)   . BPH (benign prostatic hyperplasia)   . CAD (coronary artery disease)     s/p CABG in 2002  . Hyperlipidemia   . OSA (obstructive sleep apnea)   . Aortic aneurysm ([place])     3 CM in 2007  . Carotid stenosis   . DDD (degenerative disc disease)   . Trigeminal neuralgia   . Lower GI bleed   .  Stroke ([place])   . Allergy   . Aortic stenosis    Social History   Social History  . Marital Status: Married    Spouse Name: N/A  . Number of Children: N/A  . Years of Education: N/A   Social History Main Topics  . Smoking status: Never Smoker   . Smokeless tobacco: Never Used  . Alcohol Use: No  . Drug Use: No  . Sexual Activity: No   Other Topics Concern  . None   Social History Narrative   reports that he has never smoked. He does not have any smokeless tobacco history on file. He reports that he does not drink alcohol. His drug history not on file.         Family History  Problem Relation Age of Onset  . CAD Brother   . Heart disease Brother   . Cancer Mother    Scheduled Meds: . aspirin EC  81 mg Oral Daily  . enoxaparin (LOVENOX) injection  40 mg Subcutaneous Q24H  . finasteride  5 mg Oral Daily  . levETIRAcetam  500 mg Intravenous Q12H  . metoprolol succinate  25 mg Oral Daily  . QUEtiapine  25 mg Oral QHS  . sodium chloride  3 mL Intravenous Q12H  . sulfamethoxazole-trimethoprim  1 tablet Oral Q12H  . tamsulosin  0.4 mg Oral QPC supper  . triamcinolone cream  1 application Topical BID   Continuous Infusions:  PRN Meds:.LORazepam, metoprolol, ondansetron **OR** ondansetron (ZOFRAN) IV, polyethylene glycol, traMADol Medications Prior to Admission:  Prior to Admission medications   Medication Sig Start Date End Date Taking? Authorizing Provider  aspirin EC 81 MG tablet Take 1 tablet (81 mg total) by mouth daily. 02/13/15  Yes Shanker Kristeen Mans, MD  cefUROXime (CEFTIN) 500 MG tablet Take 1 tablet (500 mg total) by mouth 2 (two) times daily with a meal. 04/29/15  Yes Verlee Monte, MD  finasteride (PROSCAR) 5 MG tablet Take 1 tablet (5 mg total) by mouth daily. 02/10/15  Yes Shanker Kristeen Mans, MD  fluconazole (DIFLUCAN) 100 MG tablet Take 1 tablet (100 mg total) by mouth daily. 04/29/15  Yes Verlee Monte, MD  levETIRAcetam (KEPPRA) 500 MG tablet Take 1 tablet (500  mg total) by mouth every 12 (twelve) hours. 01/21/15  Yes Benjamin Cartner, PA-C  LORazepam (ATIVAN) 0.5 MG tablet Take 1 tablet (0.5 mg total) by mouth every 8 (eight) hours as needed (For Agitation). Patient taking differently: Take 0.5 mg by mouth every 8 (eight) hours as needed (For Agitation). Hold for sedation 04/29/15  Yes Verlee Monte, MD  metoprolol succinate (TOPROL-XL) 25 MG 24 hr tablet Take 1 tablet (25 mg total) by mouth daily. 02/10/15  Yes Shanker Kristeen Mans, MD  polyethylene glycol (MIRALAX / GLYCOLAX) packet Take 17 g by mouth daily. 04/11/15  Yes Lavina Hamman, MD  QUEtiapine (SEROQUEL) 25 MG tablet Take 1 tablet (25 mg total) by mouth at bedtime. Patient taking differently: Take 25 mg by mouth at bedtime. Hold for sedation 04/29/15  Yes Mutaz  Elmahi, MD  tamsulosin (FLOMAX) 0.4 MG CAPS capsule Take 1 capsule (0.4 mg total) by mouth daily after supper. 02/10/15  Yes Shanker Kristeen Mans, MD  traMADol (ULTRAM) 50 MG tablet Take 1 tablet (50 mg total) by mouth every 8 (eight) hours as needed for moderate pain. 04/29/15  Yes Verlee Monte, MD  triamcinolone cream (KENALOG) 0.1 % Apply 1 application topically 2 (two) times daily.   Yes Historical Provider, MD  senna-docusate (SENOKOT-S) 8.6-50 MG tablet Take 2 tablets by mouth 2 (two) times daily. Patient not taking: Reported on 05/02/2015 04/12/15   Lavina Hamman, MD   Allergies  Allergen Reactions  . Benadryl [Diphenhydramine Hcl]     "talks out of his head"  . Oxycodone     Feels crazy  . Tylenol [Acetaminophen]     unknown    Review of Systems  Unable to perform ROS   Physical Exam  Constitutional: He appears well-developed and well-nourished.  Respiratory: He has decreased breath sounds in the right lower field and the left lower field.  Neurological: He is alert.  Skin: Skin is warm and dry.    Vital Signs: BP 135/68 mmHg  Pulse 66  Temp(Src) 97.5 F (36.4 C) (Oral)  Resp 20  Ht 5\' 10"  (1.778 m)  Wt 71.8 kg (158  lb 4.6 oz)  BMI 22.71 kg/m2  SpO2 97%  SpO2: SpO2: 97 % O2 Device:SpO2: 97 % O2 Flow Rate: .   IO: Intake/output summary:  Intake/Output Summary (Last 24 hours) at 05/05/15 1128 Last data filed at 05/05/15 0441  Gross per 24 hour  Intake    440 ml  Output    800 ml  Net   -360 ml    LBM: Last BM Date: 05/05/15 Baseline Weight: Weight: 71.8 kg (158 lb 4.6 oz) Most recent weight: Weight: 71.8 kg (158 lb 4.6 oz)      Palliative Assessment/Data:  Flowsheet Rows        Most Recent Value   Intake Tab    Referral Department  Hospitalist   Unit at Time of Referral  Cardiac/Telemetry Unit   Palliative Care Primary Diagnosis  Neurology   Date Notified  05/04/15   Palliative Care Type  New Palliative care   Reason for referral  Clarify Goals of Care   Date of Admission  05/02/15   Date first seen by Palliative Care  05/05/15   # of days Palliative referral response time  1 Day(s)   # of days IP prior to Palliative referral  2   Clinical Assessment    Psychosocial & Spiritual Assessment    Palliative Care Outcomes       Additional Data Reviewed:  CBC:    Component Value Date/Time   WBC 6.6 05/03/2015 0415   WBC 7.9 01/24/2015 1543   HGB 10.2* 05/03/2015 0415   HGB 14.4 01/24/2015 1543   HCT 31.3* 05/03/2015 0415   HCT 44.0 01/24/2015 1543   PLT 223 05/03/2015 0415   MCV 97.2 05/03/2015 0415   MCV 89.9 01/24/2015 1543   NEUTROABS 5.1 05/02/2015 1724   LYMPHSABS 1.7 05/02/2015 1724   MONOABS 0.6 05/02/2015 1724   EOSABS 0.8* 05/02/2015 1724   BASOSABS 0.0 05/02/2015 1724   Comprehensive Metabolic Panel:    Component Value Date/Time   NA 139 05/05/2015 0645   K 4.1 05/05/2015 0645   CL 110 05/05/2015 0645   CO2 25 05/05/2015 0645   BUN 10 05/05/2015 0645   CREATININE 1.14 05/05/2015  0645   CREATININE 1.33 01/18/2013 1204   GLUCOSE 80 05/05/2015 0645   CALCIUM 8.2* 05/05/2015 0645   AST 16 05/03/2015 0415   ALT 24 05/03/2015 0415   ALKPHOS 105 05/03/2015  0415   BILITOT 0.8 05/03/2015 0415   PROT 5.7* 05/03/2015 0415   ALBUMIN 2.2* 05/03/2015 0415     Time In: 0930 Time Out: 1045 Time Total: 75 min Greater than 50%  of this time was spent counseling and coordinating care related to the above assessment and plan.  Signed by: Wadie Lessen, NP  Knox Royalty, NP  05/05/2015, 11:28 AM  Please contact Palliative Medicine Team phone at [phone] for questions and concerns.     Discussed with Dr Erlinda Hong

## 2015-05-05 NOTE — Progress Notes (Signed)
   05/05/15 1600  Clinical Encounter Type  Visited With Patient  Visit Type Initial;Spiritual support;Social support  Referral From Palliative care team  Consult/Referral To Chaplain  Spiritual Encounters  Spiritual Needs Emotional  Ch visited with pt; difficult of hearing and difficult to understand; pleasant but in observation, [place] noticed that pt seemed a bit disoriented; [place] will follow-up and try to meet with family if possible. Gwynn Burly 4:24 PM

## 2015-05-05 NOTE — Progress Notes (Signed)
PROGRESS NOTE  Larry Bradford [id number] DOB: December 23, 1916 DOA: 05/02/2015 PCP: Doristine Devoid, MD  HPI/Recap of past 24 hours:  Now fully alert, very hard of hearing, only oriented to self, does follow command with intermittent agitation.  urine ? Blood tinged in chronic foley.  Assessment/Plan: Principal Problem:   Acute encephalopathy Active Problems:   Hypertension   Seizures (HCC)   Depression   BPH (benign prostatic hyperplasia)   CAD (coronary artery disease)   History of stroke   Altered mental status   UTI (lower urinary tract infection), secondary to chronic indwelling Foley catheter   Sepsis (HCC)   Chronic combined systolic and diastolic congestive heart failure ([place])   DNR (do not resuscitate) discussion   Palliative care encounter  Confusion:  Was stuporous on admission, now fully alert from seizure? No witness seizure. from uti? Urine cloudy, with indwelling foley,  No leukocytosis, no fever, had mild lactic acid on admission which could be from infection or seizure.  will continue abx to finish total of 7 days,  Restart oral meds now that patient is awake.   Hypomagnesemia: replace mag iv  Seizure:  -keep on iv keppra for now -keppra level borderline low at 9.5, absorption issues vs noncompliant. Continue iv keppra for now.  CAD: s/p CABG 2002. Dose not seem to have CP. -continue aspirin, metoprolol,  Depression and anxiety: -Continue Seroquel and Ativan when necessary  History of stroke -On aspirin  Combined systolic and diastolic congestive heart failure: 2-D echo on 02/09/15 showed EF of 45-50 percent discrete 1 diastolic dysfunction. Patient is not on diuretics at home. No leg edema. CHF is compensated. -Continue aspirin and metoprolol   BPH /recent CT ab showed massive prostatomegaly:  - Continue Flomax and proscar, chronic foley -check psa, consider urology referral if family choose aggressive measures.  Dementia: likely vascular  dementia with prior h/o cva  FTT with advanced age, palliative care consulted for code status discussion and goal of care.  Code Status: full  Family Communication: patient   Disposition Plan:  SNF 12/6   Consultants:  Palliative care  Procedures:  none  Antibiotics:  Gentx1,  bactrim   Objective: BP 130/74 mmHg  Pulse 70  Temp(Src) 98.8 F (37.1 C) (Oral)  Resp 21  Ht 5\' 10"  (1.778 m)  Wt 158 lb 4.6 oz (71.8 kg)  BMI 22.71 kg/m2  SpO2 99%  Intake/Output Summary (Last 24 hours) at 05/05/15 1554 Last data filed at 05/05/15 1142  Gross per 24 hour  Intake    560 ml  Output    700 ml  Net   -140 ml   Filed Weights   05/03/15 0143  Weight: 158 lb 4.6 oz (71.8 kg)    Exam:   General:  Frail, fully alert, oriented to self only today, intermittent agitation, chronic foley  Cardiovascular: RRR  Respiratory: CTABL  Abdomen: Soft/ND/NT, positive BS,   Musculoskeletal: No Edema  Neuro: moving all extremities, dementia.  Data Reviewed: Basic Metabolic Panel:  Recent Labs Lab 04/29/15 0408 05/02/15 1724 05/03/15 0415 05/04/15 0750 05/05/15 0645  NA 141 143 140 141 139  K 4.0 3.9 3.5 3.6 4.1  CL 106 108 107 111 110  CO2 29 30 31 27 25   GLUCOSE 113* 104* 107* 84 80  BUN 13 13 12 9 10   CREATININE 1.02 1.09 1.04 0.90 1.14  CALCIUM 8.5* 8.9 8.1* 8.1* 8.2*  MG  --   --   --  1.6* 2.0  Liver Function Tests:  Recent Labs Lab 05/03/15 0415  AST 16  ALT 24  ALKPHOS 105  BILITOT 0.8  PROT 5.7*  ALBUMIN 2.2*   No results for input(s): LIPASE, AMYLASE in the last 168 hours.  Recent Labs Lab 05/04/15 0750  AMMONIA 10   CBC:  Recent Labs Lab 05/02/15 1724 05/03/15 0415  WBC 8.2 6.6  NEUTROABS 5.1  --   HGB 11.4* 10.2*  HCT 35.6* 31.3*  MCV 96.5 97.2  PLT 246 223   Cardiac Enzymes:   No results for input(s): CKTOTAL, CKMB, CKMBINDEX, TROPONINI in the last 168 hours. BNP (last 3 results)  Recent Labs  02/07/15 2154  05/03/15 0415  BNP 48.2 361.5*    ProBNP (last 3 results) No results for input(s): PROBNP in the last 8760 hours.  CBG:  Recent Labs Lab 05/03/15 0729 05/04/15 0804 05/05/15 0809 05/05/15 0853  GLUCAP 79 79 68 82    Recent Results (from the past 240 hour(s))  Urine culture     Status: None   Collection Time: 04/25/15 10:07 PM  Result Value Ref Range Status   Specimen Description URINE, CATHETERIZED  Final   Special Requests Normal  Final   Culture   Final    NO GROWTH 1 DAY Performed at [facility]    Report Status 04/27/2015 FINAL  Final  Urine culture     Status: None   Collection Time: 05/03/15  1:08 AM  Result Value Ref Range Status   Specimen Description URINE, CATHETERIZED  Final   Special Requests NONE  Final   Culture   Final    NO GROWTH 2 DAYS Performed at [facility]    Report Status 05/05/2015 FINAL  Final  Culture, blood (routine x 2)     Status: None (Preliminary result)   Collection Time: 05/03/15  1:08 AM  Result Value Ref Range Status   Specimen Description BLOOD BLOOD RIGHT FOREARM  Final   Special Requests IN PEDIATRIC BOTTLE 3CC  Final   Culture   Final    NO GROWTH 2 DAYS Performed at [facility]    Report Status PENDING  Incomplete  Culture, blood (routine x 2)     Status: None (Preliminary result)   Collection Time: 05/03/15  1:12 AM  Result Value Ref Range Status   Specimen Description BLOOD RIGHT ANTECUBITAL  Final   Special Requests IN PEDIATRIC BOTTLE 3CC  Final   Culture   Final    NO GROWTH 2 DAYS Performed at [facility]    Report Status PENDING  Incomplete  MRSA PCR Screening     Status: None   Collection Time: 05/03/15  6:12 AM  Result Value Ref Range Status   MRSA by PCR NEGATIVE NEGATIVE Final    Comment:        The GeneXpert MRSA Assay (FDA approved for NASAL specimens only), is one component of a comprehensive MRSA colonization surveillance program. It is not intended to diagnose  MRSA infection nor to guide or monitor treatment for MRSA infections.      Studies: No results found.  Scheduled Meds: . aspirin EC  81 mg Oral Daily  . enoxaparin (LOVENOX) injection  40 mg Subcutaneous Q24H  . finasteride  5 mg Oral Daily  . levETIRAcetam  500 mg Intravenous Q12H  . metoprolol succinate  25 mg Oral Daily  . QUEtiapine  25 mg Oral QHS  . sodium chloride  3 mL Intravenous Q12H  .  sulfamethoxazole-trimethoprim  1 tablet Oral Q12H  . tamsulosin  0.4 mg Oral QPC supper  . triamcinolone cream  1 application Topical BID    Continuous Infusions:    Time spent: 28mins  Toleen Lachapelle MD, PhD  [facility] Pager [phone]. If 7PM-7AM, please contact night-coverage at [email or web address], password [facility] 05/05/2015, 3:54 PM  LOS: 2 days

## 2015-05-05 NOTE — Progress Notes (Signed)
CSW rec'd call from unit RN asking that CSW call step-daughter- Laurey Morale [phone]. CSW spoke with [name] who is interested in looking at [facility] for patient at [place] or [facility]. CSW attempted to speak with patient but found he was unable to engage. CSW has attempted to reach son, [name], to discuss dc plans but no answer and no VM set up. incoherent. CSW will pursue SNF options and communicate with family further for appropriate dc planning.   Eduard Clos, MSW, [place]

## 2015-05-05 NOTE — Progress Notes (Signed)
At change of shift 1930, patient had been trying to get out of the bed constantly, it was reported from the day RN that patient had progressively getting worse in confusion.  This RN had stayed in the room for 1 1/2 hours trying to keep him safe and no sitter was available at the time. Charged nurse, AC and security came to help. As a team, we transferred him to [facility] then to the recliner.  Initially, patient had refused to take PO ativan but did take it after the charge nurse convinced him.  Haldol was given via IV.  Sitter was pulled from [place] to sit with this patient. Then patient started to get out of the recliner.  It was then patient was transferred back to the bed where he eventually settle down and went to sleep. Restriant ordered but is not utilize at this time because it  felt it mayl make him more agaitated, He did refused all his night meds. An assigned sitter is sitting in his room now.  IV site is wrapped with ace to protect the line. Also, this Rn bladder scanned (410 ml) him because there was not much output in the foley bag. Also checked for blockage and flushed catheter without difficulty with small amount of  bloody color flow back.  Then this RN consulted with charged Rn and we deflated the balloon and advanced the catheher.  Immediate urine output seen ( cloudy tea color ) close to 400 ml.  New stat lock placed to secured catheter. Patient still sleeping.   Will continue to monitor.

## 2015-05-05 NOTE — Progress Notes (Signed)
Physical Therapy Treatment Patient Details Name: Larry Bradford MRN: [id number] DOB: 12/04/1916 Today's Date: 05/05/2015    History of Present Illness 81 y.o. male with PMH of [place] Foley catheter, Seizures ([place]); Cataract; Hard of hearing; Renal disorder; Depression; HTN (hypertension); BPH (benign prostatic hyperplasia); CAD (coronary artery disease); Hyperlipidemia; OSA (obstructive sleep apnea); Aortic aneurysm ([place]); Carotid stenosis; DDD (degenerative disc disease); Trigeminal neuralgia; Lower GI bleed; Stroke [facility]); Allergy; and Aortic stenosis, who presents with AMS.    PT Comments    HIGH FALL RISK. Pt. Required min-mod assist with sidelying -> sit to bring trunk upright and assist BLE to EOB; required min-mod assist x2 for physical assist and safety with STS to rise and stabilize as pt. Is VERY UNSTEADY with lateral and posterior sways; ambulated ~40' in hallway with RW - 50% VC for posture and position from RW and mod assist x2 to balance and navigate RW; pt. presents with moderate intermittent R knee buckling and UNSTEADY/UNSAFE GAIT   Follow Up Recommendations  SNF;Supervision/Assistance - 24 hour     Equipment Recommendations  None recommended by PT    Recommendations for Other Services OT consult     Precautions / Restrictions Precautions Precautions: Fall Restrictions Weight Bearing Restrictions: No    Mobility  Bed Mobility Overal bed mobility: Needs Assistance Bed Mobility: Rolling;Sidelying to Sit Rolling: Supervision;Modified independent (Device/Increase time) Sidelying to sit: Min assist;Mod assist       General bed mobility comments: required min-mod physical assist for sidelying - sit to bring trunk upright and legs to EOB  Transfers Overall transfer level: Needs assistance Equipment used: Rolling walker (2 wheeled);2 person hand held assist Transfers: Sit to/from Stand Sit to Stand: Mod assist;+2 physical assistance;+2 safety/equipment;Min  assist         General transfer comment: min-mod assist x2 to rise and stabilize as pt. very unsteady; lateral and posterior sways  Ambulation/Gait Ambulation/Gait assistance: Mod assist;+2 physical assistance;+2 safety/equipment Ambulation Distance (Feet): 40 Feet Assistive device: Rolling walker (2 wheeled);2 person hand held assist Gait Pattern/deviations: Decreased stride length;Trunk flexed;Step-through pattern Gait velocity: decreased   General Gait Details: 50% VC for posture and position from RW; mod assist x2 to balance and navigate RW; pt. presents with moderate intermittent R knee buckling   Stairs            Wheelchair Mobility    Modified Rankin (Stroke Patients Only)       Balance                                    Cognition Arousal/Alertness: Awake/alert Behavior During Therapy: WFL for tasks assessed/performed Overall Cognitive Status: No family/caregiver present to determine baseline cognitive functioning Area of Impairment: Orientation;Safety/judgement;Following commands;Problem solving;Attention Orientation Level: Disoriented to;Situation;Place;Time Current Attention Level: Focused;Sustained   Following Commands: Follows one step commands with increased time;Follows one step commands inconsistently Safety/Judgement: Decreased awareness of safety   Problem Solving: Slow processing;Difficulty sequencing;Requires verbal cues;Requires tactile cues General Comments: hears better with Right ear    Exercises      General Comments        Pertinent Vitals/Pain Pain Assessment: No/denies pain    Home Living                      Prior Function            PT Goals (current goals can now be found in the care  plan section) Acute Rehab PT Goals Time For Goal Achievement: 05/12/15 Potential to Achieve Goals: Fair Progress towards PT goals: Progressing toward goals    Frequency  Min 3X/week    PT Plan Current plan  remains appropriate    Co-evaluation             End of Session Equipment Utilized During Treatment: Gait belt Activity Tolerance: Patient tolerated treatment well Patient left: with call bell/phone within reach;in chair;with chair alarm set     Time: [id number] PT Time Calculation (min) (ACUTE ONLY): 18 min  Charges:  $Gait Training: 8-22 mins                    G CodesDenna Haggard, SPTA   05/05/2015 1:17 PM   Pager: [phone]    Reviewed and agree with above Rica Koyanagi  PTA [facility]  Acute  Rehab Pager      [phone]

## 2015-05-06 DIAGNOSIS — Z515 Encounter for palliative care: Secondary | ICD-10-CM

## 2015-05-06 LAB — BASIC METABOLIC PANEL
ANION GAP: 5 (ref 5–15)
BUN: 9 mg/dL (ref 6–20)
CHLORIDE: 104 mmol/L (ref 101–111)
CO2: 25 mmol/L (ref 22–32)
Calcium: 8.3 mg/dL — ABNORMAL LOW (ref 8.9–10.3)
Creatinine, Ser: 1.18 mg/dL (ref 0.61–1.24)
GFR, EST AFRICAN AMERICAN: 58 mL/min — AB (ref >60)
GFR, EST NON AFRICAN AMERICAN: 50 mL/min — AB (ref >60)
Glucose, Bld: 77 mg/dL (ref 65–99)
POTASSIUM: 5 mmol/L (ref 3.5–5.1)
SODIUM: 134 mmol/L — AB (ref 135–145)

## 2015-05-06 LAB — CBC
HCT: 29 % — ABNORMAL LOW (ref 39.0–52.0)
Hemoglobin: 9.7 g/dL — ABNORMAL LOW (ref 13.0–17.0)
MCH: 31.8 pg (ref 26.0–34.0)
MCHC: 33.4 g/dL (ref 30.0–36.0)
MCV: 95.1 fL (ref 78.0–100.0)
PLATELETS: 238 10*3/uL (ref 150–400)
RBC: 3.05 MIL/uL — AB (ref 4.22–5.81)
RDW: 14 % (ref 11.5–15.5)
WBC: 8 10*3/uL (ref 4.0–10.5)

## 2015-05-06 LAB — GLUCOSE, CAPILLARY
GLUCOSE-CAPILLARY: 65 mg/dL (ref 65–99)
Glucose-Capillary: 127 mg/dL — ABNORMAL HIGH (ref 65–99)

## 2015-05-06 LAB — PSA: PSA: 24.18 ng/mL — ABNORMAL HIGH (ref 0.00–4.00)

## 2015-05-06 LAB — MAGNESIUM: MAGNESIUM: 1.9 mg/dL (ref 1.7–2.4)

## 2015-05-06 LAB — BRAIN NATRIURETIC PEPTIDE: B Natriuretic Peptide: 831.7 pg/mL — ABNORMAL HIGH (ref 0.0–100.0)

## 2015-05-06 MED ORDER — DEXTROSE 50 % IV SOLN
INTRAVENOUS | Status: AC
Start: 2015-05-06 — End: 2015-05-06
  Administered 2015-05-06: 25 mL
  Filled 2015-05-06: qty 50

## 2015-05-06 MED ORDER — DEXTROSE-NACL 5-0.9 % IV SOLN
INTRAVENOUS | Status: AC
  Administered 2015-05-06: 13:00:00 via INTRAVENOUS

## 2015-05-06 NOTE — Progress Notes (Signed)
PROGRESS NOTE  Larry Bradford [id number] DOB: 24-Jul-1916 DOA: 05/02/2015 PCP: Doristine Devoid, MD  HPI/Recap of past 24 hours:  Patient was fully alert on 12/5, however was very agitated last night, received 0.5 haldol, now very lethargic, two episodes of mild hypoglycemia (65) due to inconsistent oral intake.    Assessment/Plan: Principal Problem:   Acute encephalopathy Active Problems:   Hypertension   Seizures (HCC)   Depression   BPH (benign prostatic hyperplasia)   CAD (coronary artery disease)   History of stroke   Altered mental status   UTI (lower urinary tract infection), secondary to chronic indwelling Foley catheter   Sepsis (HCC)   Chronic combined systolic and diastolic congestive heart failure ([place])   DNR (do not resuscitate) discussion   Palliative care encounter  Confusion:  Was stuporous on admission,  Suspicious this was from seizure activity due to inconsistent oral meds intake, keppra level borderline low on admission, patient also has history of sleeping for days with light sedation, from uti? But culture negative, no fever, no leukocytosis. had mild lactic acid on admission which could be from infection or seizure.   became fully alert on 12/5, then lethargic on 12/6 after 0.5mg  haldol. Start on ivf with d5/ns at 50cc/hr for 24hrs.  will continue abx to finish total of 7 days,   Hypomagnesemia: replace mag iv, repeat lab   H/o Seizure:  -keep on iv keppra for now -keppra level borderline low at 9.5, absorption issues vs noncompliant. No seizure activity in the hospital.  CAD: s/p CABG 2002. Dose not seem to have CP. -continue aspirin, metoprolol,  Depression and anxiety: -Continue Seroquel and Ativan when necessary  History of stroke -On aspirin  Combined systolic and diastolic congestive heart failure: 2-D echo on 02/09/15 showed EF of 45-50 percent discrete 1 diastolic dysfunction. Patient is not on diuretics at home. No leg edema.  CHF is compensated. -Continue aspirin and metoprolol   BPH /recent CT ab showed massive prostatomegaly:  - Continue Flomax and proscar, chronic foley -psa significantly elevated at 24 -consider urology referral if family chooses aggressive measures.  Dementia: likely vascular dementia with prior h/o cva, also extremity hard of hearing.  FTT with advanced age, recent frequent hospitalization. palliative care consulted for code status discussion and goal of care. Very difficult family dynamics, may need ethic committee involvement.  Code Status: full  Family Communication: patient   Disposition Plan:  SNF in 1-2 days, need to continue address goal of care.   Consultants:  Palliative care  Procedures:  none  Antibiotics:  Gentx1,  bactrim   Objective: BP 166/84 mmHg  Pulse 76  Temp(Src) 98.1 F (36.7 C) (Oral)  Resp 21  Ht 5\' 10"  (1.778 m)  Wt 158 lb 4.6 oz (71.8 kg)  BMI 22.71 kg/m2  SpO2 98%  Intake/Output Summary (Last 24 hours) at 05/06/15 1228 Last data filed at 05/06/15 [id number]  Gross per 24 hour  Intake    460 ml  Output   1400 ml  Net   -940 ml   Filed Weights   05/03/15 0143  Weight: 158 lb 4.6 oz (71.8 kg)    Exam:   General:  Frail, lethargic, sitting in room,   Cardiovascular: RRR  Respiratory: CTABL  Abdomen: Soft/ND/NT, positive BS,   Musculoskeletal: No Edema  Neuro: moving all extremities, dementia. Lethargic, hard of hearing.  Data Reviewed: Basic Metabolic Panel:  Recent Labs Lab 05/02/15 1724 05/03/15 0415 05/04/15 0750 05/05/15 0645 05/06/15 [id number]  NA 143 140 141 139 134*  K 3.9 3.5 3.6 4.1 5.0  CL 108 107 111 110 104  CO2 30 31 27 25 25   GLUCOSE 104* 107* 84 80 77  BUN 13 12 9 10 9   CREATININE 1.09 1.04 0.90 1.14 1.18  CALCIUM 8.9 8.1* 8.1* 8.2* 8.3*  MG  --   --  1.6* 2.0 1.9   Liver Function Tests:  Recent Labs Lab 05/03/15 0415  AST 16  ALT 24  ALKPHOS 105  BILITOT 0.8  PROT 5.7*  ALBUMIN 2.2*   No  results for input(s): LIPASE, AMYLASE in the last 168 hours.  Recent Labs Lab 05/04/15 0750  AMMONIA 10   CBC:  Recent Labs Lab 05/02/15 1724 05/03/15 0415 05/06/15 0558  WBC 8.2 6.6 8.0  NEUTROABS 5.1  --   --   HGB 11.4* 10.2* 9.7*  HCT 35.6* 31.3* 29.0*  MCV 96.5 97.2 95.1  PLT 246 223 238   Cardiac Enzymes:   No results for input(s): CKTOTAL, CKMB, CKMBINDEX, TROPONINI in the last 168 hours. BNP (last 3 results)  Recent Labs  02/07/15 2154 05/03/15 0415 05/06/15 0558  BNP 48.2 361.5* 831.7*    ProBNP (last 3 results) No results for input(s): PROBNP in the last 8760 hours.  CBG:  Recent Labs Lab 05/04/15 0804 05/05/15 0809 05/05/15 0853 05/06/15 0809 05/06/15 0836  GLUCAP 79 68 82 65 127*    Recent Results (from the past 240 hour(s))  Urine culture     Status: None   Collection Time: 05/03/15  1:08 AM  Result Value Ref Range Status   Specimen Description URINE, CATHETERIZED  Final   Special Requests NONE  Final   Culture   Final    NO GROWTH 2 DAYS Performed at [facility]    Report Status 05/05/2015 FINAL  Final  Culture, blood (routine x 2)     Status: None (Preliminary result)   Collection Time: 05/03/15  1:08 AM  Result Value Ref Range Status   Specimen Description BLOOD BLOOD RIGHT FOREARM  Final   Special Requests IN PEDIATRIC BOTTLE 3CC  Final   Culture   Final    NO GROWTH 3 DAYS Performed at [facility]    Report Status PENDING  Incomplete  Culture, blood (routine x 2)     Status: None (Preliminary result)   Collection Time: 05/03/15  1:12 AM  Result Value Ref Range Status   Specimen Description BLOOD RIGHT ANTECUBITAL  Final   Special Requests IN PEDIATRIC BOTTLE 3CC  Final   Culture   Final    NO GROWTH 3 DAYS Performed at [facility]    Report Status PENDING  Incomplete  MRSA PCR Screening     Status: None   Collection Time: 05/03/15  6:12 AM  Result Value Ref Range Status   MRSA by PCR NEGATIVE  NEGATIVE Final    Comment:        The GeneXpert MRSA Assay (FDA approved for NASAL specimens only), is one component of a comprehensive MRSA colonization surveillance program. It is not intended to diagnose MRSA infection nor to guide or monitor treatment for MRSA infections.      Studies: No results found.  Scheduled Meds: . aspirin EC  81 mg Oral Daily  . enoxaparin (LOVENOX) injection  40 mg Subcutaneous Q24H  . finasteride  5 mg Oral Daily  . levETIRAcetam  500 mg Intravenous Q12H  . metoprolol succinate  25 mg Oral  Daily  . QUEtiapine  25 mg Oral QHS  . sodium chloride  3 mL Intravenous Q12H  . sulfamethoxazole-trimethoprim  1 tablet Oral Q12H  . tamsulosin  0.4 mg Oral QPC supper  . triamcinolone cream  1 application Topical BID    Continuous Infusions:    Time spent: 23mins  Sana Tessmer MD, PhD  [facility] Pager [phone]. If 7PM-7AM, please contact night-coverage at [email or web address], password [facility] 05/06/2015, 12:28 PM  LOS: 3 days

## 2015-05-06 NOTE — Care Management Important Message (Signed)
Important Message  Patient Details  Name: Larry Bradford MRN: [id number] Date of Birth: 10-Nov-1916   Medicare Important Message Given:  Yes    Camillo Flaming 05/06/2015, 12:01 [place] Message  Patient Details  Name: Larry Bradford MRN: [id number] Date of Birth: 06/30/1916   Medicare Important Message Given:  Yes    Camillo Flaming 05/06/2015, 12:01 PM

## 2015-05-06 NOTE — Progress Notes (Signed)
Pt has not needed restraints all shift and has slept the majority of the time.  Rosine Beat, RN

## 2015-05-06 NOTE — Progress Notes (Signed)
CSW has spoken with Larry Bradford again today- she reports they are still hoping for SNF bed at one of their preferred SNF choices- I have updated her to current offers as well as a decline from [facility] which was a preference- I have left 2 more messages for the son today- [name] and have asked that he return my call.   Eduard Clos, MSW, [place]

## 2015-05-06 NOTE — Progress Notes (Signed)
Hypoglycemic Event  CBG: 65  Treatment: Dextrose 50% IV 41ml   Symptoms: none   Follow-up CBG: Time:127  CBG Result:0835  Possible Reasons for Event: poor intake   Comments/MD notified: [place]

## 2015-05-07 DIAGNOSIS — A419 Sepsis, unspecified organism: Secondary | ICD-10-CM

## 2015-05-07 DIAGNOSIS — F329 Major depressive disorder, single episode, unspecified: Secondary | ICD-10-CM

## 2015-05-07 DIAGNOSIS — I10 Essential (primary) hypertension: Secondary | ICD-10-CM

## 2015-05-07 DIAGNOSIS — G9340 Encephalopathy, unspecified: Secondary | ICD-10-CM

## 2015-05-07 DIAGNOSIS — R400 Somnolence: Secondary | ICD-10-CM

## 2015-05-07 DIAGNOSIS — N400 Enlarged prostate without lower urinary tract symptoms: Secondary | ICD-10-CM

## 2015-05-07 DIAGNOSIS — N390 Urinary tract infection, site not specified: Secondary | ICD-10-CM

## 2015-05-07 DIAGNOSIS — Z7189 Other specified counseling: Secondary | ICD-10-CM

## 2015-05-07 DIAGNOSIS — I5042 Chronic combined systolic (congestive) and diastolic (congestive) heart failure: Secondary | ICD-10-CM

## 2015-05-07 DIAGNOSIS — Z8673 Personal history of transient ischemic attack (TIA), and cerebral infarction without residual deficits: Secondary | ICD-10-CM

## 2015-05-07 DIAGNOSIS — R569 Unspecified convulsions: Secondary | ICD-10-CM

## 2015-05-07 DIAGNOSIS — I2510 Atherosclerotic heart disease of native coronary artery without angina pectoris: Secondary | ICD-10-CM

## 2015-05-07 LAB — CBC
HCT: 29.8 % — ABNORMAL LOW (ref 39.0–52.0)
HEMOGLOBIN: 9.8 g/dL — AB (ref 13.0–17.0)
MCH: 31.2 pg (ref 26.0–34.0)
MCHC: 32.9 g/dL (ref 30.0–36.0)
MCV: 94.9 fL (ref 78.0–100.0)
PLATELETS: 255 10*3/uL (ref 150–400)
RBC: 3.14 MIL/uL — ABNORMAL LOW (ref 4.22–5.81)
RDW: 14.1 % (ref 11.5–15.5)
WBC: 5.7 10*3/uL (ref 4.0–10.5)

## 2015-05-07 LAB — BASIC METABOLIC PANEL
Anion gap: 7 (ref 5–15)
BUN: 8 mg/dL (ref 6–20)
CHLORIDE: 107 mmol/L (ref 101–111)
CO2: 23 mmol/L (ref 22–32)
CREATININE: 1.2 mg/dL (ref 0.61–1.24)
Calcium: 8.4 mg/dL — ABNORMAL LOW (ref 8.9–10.3)
GFR calc Af Amer: 57 mL/min — ABNORMAL LOW (ref >60)
GFR calc non Af Amer: 49 mL/min — ABNORMAL LOW (ref >60)
Glucose, Bld: 74 mg/dL (ref 65–99)
Potassium: 4.2 mmol/L (ref 3.5–5.1)
SODIUM: 137 mmol/L (ref 135–145)

## 2015-05-07 LAB — GLUCOSE, CAPILLARY: GLUCOSE-CAPILLARY: 72 mg/dL (ref 65–99)

## 2015-05-07 LAB — MAGNESIUM: MAGNESIUM: 2 mg/dL (ref 1.7–2.4)

## 2015-05-07 NOTE — Progress Notes (Signed)
Patient for d/c today to SNF bed at [facility] rec'd from insurance [phone]). [name], family contact and patient agreeable to this plan- plan transfer via EMS. Eduard Clos, MSW, [place]

## 2015-05-07 NOTE — Progress Notes (Signed)
Physical Therapy Treatment Patient Details Name: Larry Bradford MRN: [id number] DOB: [birth year] Today's Date: 05/07/2015    History of Present Illness 81 y.o. male with PMH of [place] Foley catheter, Seizures ([place]); Cataract; Hard of hearing; Renal disorder; Depression; HTN (hypertension); BPH (benign prostatic hyperplasia); CAD (coronary artery disease); Hyperlipidemia; OSA (obstructive sleep apnea); Aortic aneurysm ([place]); Carotid stenosis; DDD (degenerative disc disease); Trigeminal neuralgia; Lower GI bleed; Stroke [facility]); Allergy; and Aortic stenosis, who presents with AMS.    PT Comments    Pt. Progressing with all mobility compared to treatment Monday; able to roll to side and sit up in bed with supervision, requiring NO physical assist, just increased time 2* weakness/fatigue; required min guard assist x2 (on therapist on R and L) for safety and RW with STS from lowered bed; ambulated ~37' in hallway with RW requiring min guard x2 for first ~25' and requiring increased assist (min assist) for remaining ~12', no LOB, required 50% VC for positioning inside the RW; LIMITED MOBILITY 2* DECREASED ENDURANCE/WEAKNESS. HIGH FALL RISK. Will benefit from SNF for increased safety and independence with mobility.    Follow Up Recommendations  SNF;Supervision/Assistance - 24 hour     Equipment Recommendations  None recommended by PT    Recommendations for Other Services OT consult     Precautions / Restrictions Precautions Precautions: Fall Restrictions Weight Bearing Restrictions: No    Mobility  Bed Mobility Overal bed mobility: Modified Independent Bed Mobility: Rolling;Sidelying to Sit Rolling: Supervision;Modified independent (Device/Increase time) Sidelying to sit: Supervision;Modified independent (Device/Increase time)       General bed mobility comments: improved bed mobility since monday; pt. able to roll to sidelying and sit up with no physical assistance, increased  time  Transfers Overall transfer level: Needs assistance Equipment used: Rolling walker (2 wheeled);2 person hand held assist Transfers: Sit to/from Stand Sit to Stand: Min guard;Supervision         General transfer comment: pt. progressing with rising and steady with STS requiring min guard x2 (therapist on R and L) for safety as pt. was stating he felt weak   Ambulation/Gait Ambulation/Gait assistance: Min guard;Min assist  + 2 assist for safety as B knees buckle with fatigue Ambulation Distance (Feet): 37 Feet Assistive device: Rolling walker (2 wheeled);2 person hand held assist Gait Pattern/deviations: Decreased stride length;Trunk flexed;Step-to pattern;Step-through pattern Gait velocity: decreased   General Gait Details: 50% VC for posture and position from RW; min guard x2 for first 25' -> min assist x2 to keep pt. standing tall for last 12'; noted decreased knee flexion bilaterally with ambulation    Stairs            Wheelchair Mobility    Modified Rankin (Stroke Patients Only)       Balance                                    Cognition Arousal/Alertness: Awake/alert Behavior During Therapy: WFL for tasks assessed/performed Overall Cognitive Status: No family/caregiver present to determine baseline cognitive functioning Area of Impairment: Orientation;Safety/judgement;Following commands;Problem solving;Attention Orientation Level: Disoriented to;Situation;Place;Time Current Attention Level: Focused;Sustained   Following Commands: Follows one step commands with increased time;Follows one step commands consistently     Problem Solving: Slow processing;Difficulty sequencing;Requires verbal cues;Requires tactile cues General Comments: hears better with Right ear; improved with cognition in todays treatment, able to verbalize that he could walk further    Exercises  General Comments        Pertinent Vitals/Pain Pain Assessment:  No/denies pain    Home Living                      Prior Function            PT Goals (current goals can now be found in the care plan section) Acute Rehab PT Goals Time For Goal Achievement: 05/12/15 Potential to Achieve Goals: Fair Progress towards PT goals: Progressing toward goals    Frequency  Min 3X/week    PT Plan Current plan remains appropriate    Co-evaluation             End of Session Equipment Utilized During Treatment: Gait belt Activity Tolerance: Patient tolerated treatment well Patient left: with call bell/phone within reach;in chair;with chair alarm set     Time: 1055-1120 PT Time Calculation (min) (ACUTE ONLY): 25 min  Charges:  $Gait Training: 8-22 mins $Therapeutic Activity: 8-22 mins                    G CodesDenna Haggard, SPTA   05/07/2015 11:50 AM   Pager: [phone]   Reviewed and agree with above Rica Koyanagi  PTA [facility]  Acute  Rehab Pager      [phone]

## 2015-05-07 NOTE — Progress Notes (Signed)
PROGRESS NOTE  Larry Bradford [id number] DOB: 1916/10/28 DOA: 05/02/2015 PCP: Doristine Devoid, MD   HPI/Recap of past 24 hours: The patient is sleepy but easy to arouse. Patient is very hard of hearing, he answers my simple questions appropriately.  Assessment/Plan: Principal Problem:   Acute encephalopathy Active Problems:   Hypertension   Seizures (HCC)   Depression   BPH (benign prostatic hyperplasia)   CAD (coronary artery disease)   History of stroke   Altered mental status   UTI (lower urinary tract infection), secondary to chronic indwelling Foley catheter   Sepsis (HCC)   Chronic combined systolic and diastolic congestive heart failure ([place])   DNR (do not resuscitate) discussion   Palliative care encounter   Somnolence  Was stuporous on admission,  Suspicious this was from seizure activity due to inconsistent oral meds intake, keppra level borderline low on admission. Patient also has history of sleeping for days with light sedation No evidence of UTI, patient was fully alert and got lethargic after menstruation of 0.5 mg of Haldol. Very difficult situation as patient not improving, technically has failure to thrive. Family met the palliative care team and they still wanted to continue "everything". I discontinued Haldol and Ativan, as well as Flomax and Bactrim . Continue Seroquel only at night.   Hypomagnesemia:  This is repleted with supplements.  H/o Seizure:  -keep on iv keppra for now -keppra level borderline low at 9.5, absorption issues vs noncompliant. No seizure activity in the hospital, repeat Keppra level in a.m.  CAD: s/p CABG 2002. Dose not seem to have CP. -continue aspirin, metoprolol,  Depression and anxiety: -Continue Seroquel and Ativan when necessary  History of stroke -On aspirin  Combined systolic and diastolic congestive heart failure: 2-D echo on 02/09/15 showed EF of 45-50 percent discrete 1 diastolic dysfunction. Patient is  not on diuretics at home. No leg edema. CHF is compensated. -Continue aspirin and metoprolol  BPH /recent CT ab showed massive prostatomegaly:  - Continue Flomax and proscar, chronic foley -psa significantly elevated at 24 -consider urology referral if family chooses aggressive measures.  Dementia: likely vascular dementia with prior h/o cva, also extremity hard of hearing.  FTT with advanced age, recent frequent hospitalization. palliative care consulted for code status discussion and goal of care. Very difficult family dynamics, may need ethic committee involvement.  Code Status: full  Family Communication: patient   Disposition Plan:  SNF in 1-2 days, need to continue address goal of care.   Consultants:  Palliative care  Procedures:  none  Antibiotics:  Gentx1,  bactrim   Objective: BP 116/53 mmHg  Pulse 56  Temp(Src) 97.4 F (36.3 C) (Axillary)  Resp 18  Ht $R'5\' 10"'Rw$  (1.778 m)  Wt 71.8 kg (158 lb 4.6 oz)  BMI 22.71 kg/m2  SpO2 97%  Intake/Output Summary (Last 24 hours) at 05/07/15 1145 Last data filed at 05/07/15 0700  Gross per 24 hour  Intake 1585.83 ml  Output   1675 ml  Net -89.17 ml   Filed Weights   05/03/15 0143  Weight: 71.8 kg (158 lb 4.6 oz)    Exam:   General:  Frail, lethargic, sitting in room,   Cardiovascular: RRR  Respiratory: CTABL  Abdomen: Soft/ND/NT, positive BS,   Musculoskeletal: No Edema  Neuro: moving all extremities, dementia. Lethargic, hard of hearing.  Data Reviewed: Basic Metabolic Panel:  Recent Labs Lab 05/03/15 0415 05/04/15 0750 05/05/15 0645 05/06/15 0558 05/07/15 0522  NA 140 141 139 134*  137  K 3.5 3.6 4.1 5.0 4.2  CL 107 111 110 104 107  CO2 $Re'31 27 25 25 23  'ZfE$ GLUCOSE 107* 84 80 77 74  BUN $Re'12 9 10 9 8  'GGt$ CREATININE 1.04 0.90 1.14 1.18 1.20  CALCIUM 8.1* 8.1* 8.2* 8.3* 8.4*  MG  --  1.6* 2.0 1.9 2.0   Liver Function Tests:  Recent Labs Lab 05/03/15 0415  AST 16  ALT 24  ALKPHOS 105    BILITOT 0.8  PROT 5.7*  ALBUMIN 2.2*   No results for input(s): LIPASE, AMYLASE in the last 168 hours.  Recent Labs Lab 05/04/15 0750  AMMONIA 10   CBC:  Recent Labs Lab 05/02/15 1724 05/03/15 0415 05/06/15 0558 05/07/15 0522  WBC 8.2 6.6 8.0 5.7  NEUTROABS 5.1  --   --   --   HGB 11.4* 10.2* 9.7* 9.8*  HCT 35.6* 31.3* 29.0* 29.8*  MCV 96.5 97.2 95.1 94.9  PLT 246 223 238 255   Cardiac Enzymes:   No results for input(s): CKTOTAL, CKMB, CKMBINDEX, TROPONINI in the last 168 hours. BNP (last 3 results)  Recent Labs  02/07/15 2154 05/03/15 0415 05/06/15 0558  BNP 48.2 361.5* 831.7*    ProBNP (last 3 results) No results for input(s): PROBNP in the last 8760 hours.  CBG:  Recent Labs Lab 05/05/15 0809 05/05/15 0853 05/06/15 0809 05/06/15 0836 05/07/15 0734  GLUCAP 68 82 65 127* 72    Recent Results (from the past 240 hour(s))  Urine culture     Status: None   Collection Time: 05/03/15  1:08 AM  Result Value Ref Range Status   Specimen Description URINE, CATHETERIZED  Final   Special Requests NONE  Final   Culture   Final    NO GROWTH 2 DAYS Performed at [facility]    Report Status 05/05/2015 FINAL  Final  Culture, blood (routine x 2)     Status: None (Preliminary result)   Collection Time: 05/03/15  1:08 AM  Result Value Ref Range Status   Specimen Description BLOOD BLOOD RIGHT FOREARM  Final   Special Requests IN PEDIATRIC BOTTLE 3CC  Final   Culture   Final    NO GROWTH 3 DAYS Performed at [facility]    Report Status PENDING  Incomplete  Culture, blood (routine x 2)     Status: None (Preliminary result)   Collection Time: 05/03/15  1:12 AM  Result Value Ref Range Status   Specimen Description BLOOD RIGHT ANTECUBITAL  Final   Special Requests IN PEDIATRIC BOTTLE 3CC  Final   Culture   Final    NO GROWTH 3 DAYS Performed at [facility]    Report Status PENDING  Incomplete  MRSA PCR Screening     Status: None    Collection Time: 05/03/15  6:12 AM  Result Value Ref Range Status   MRSA by PCR NEGATIVE NEGATIVE Final    Comment:        The GeneXpert MRSA Assay (FDA approved for NASAL specimens only), is one component of a comprehensive MRSA colonization surveillance program. It is not intended to diagnose MRSA infection nor to guide or monitor treatment for MRSA infections.      Studies: No results found.  Scheduled Meds: . aspirin EC  81 mg Oral Daily  . enoxaparin (LOVENOX) injection  40 mg Subcutaneous Q24H  . finasteride  5 mg Oral Daily  . levETIRAcetam  500 mg Intravenous Q12H  . metoprolol succinate  25 mg Oral Daily  . QUEtiapine  25 mg Oral QHS  . sodium chloride  3 mL Intravenous Q12H  . tamsulosin  0.4 mg Oral QPC supper  . triamcinolone cream  1 application Topical BID    Continuous Infusions: . dextrose 5 % and 0.9% NaCl 50 mL/hr at 05/06/15 1259     Time spent: 35 mins  [facility] A MD [facility] Pager [phone]. If 7PM-7AM, please contact night-coverage at [email or web address], password [facility] 05/07/2015, 11:45 AM  LOS: 4 days

## 2015-05-07 NOTE — Discharge Summary (Addendum)
Physician Discharge Summary  Larry Bradford EXN:[id number] DOB: 21-Oct-1916 DOA: 05/02/2015  PCP: Larry Devoid, MD  Admit date: 05/02/2015 Discharge date: 05/07/2015  Time spent: 40 minutes  Recommendations for Outpatient Follow-up:  1. Follow up with nursing home M.D. 2. Patient is very hard of hearing. 3. Has recurrent UTIs secondary to indwelling Foley catheter. 4. Palliative care to follow-up with the patient in the nursing home   Discharge Diagnoses:  Principal Problem:   Acute encephalopathy Active Problems:   Hypertension   Seizures ([place])   Depression   BPH (benign prostatic hyperplasia)   CAD (coronary artery disease)   History of stroke   Altered mental status   UTI (lower urinary tract infection), secondary to chronic indwelling Foley catheter   Sepsis ([place])   Chronic combined systolic and diastolic congestive heart failure ([place])   DNR (do not resuscitate) discussion   Palliative care encounter   Discharge Condition:  Stable  Diet recommendation: heart healthy  Filed Weights   05/03/15 0143  Weight: 71.8 kg (158 lb 4.6 oz)    History of present illness:  Larry Bradford is a 81 y.o. male with PMH of Dw[place] Foley catheter, Seizures ([place]); Cataract; Hard of hearing; Renal disorder; Depression; HTN (hypertension); BPH (benign prostatic hyperplasia); CAD (coronary artery disease); Hyperlipidemia; OSA (obstructive sleep apnea); Aortic aneurysm ([place]); Carotid stenosis; DDD (degenerative disc disease); Trigeminal neuralgia; Lower GI bleed; Stroke [facility]); Allergy; and Aortic stenosis, who presents with AMS.  Patient has AMS and is unable to provide accurate medical history, therefore, most of the history is obtained by discussing the case with ED physician, per EMS report, and with the nursing staff. The history is very limited.   It seems that pt was recently admitted with UTI from 11/25 to 11/29. He was discharged to a nursing facility, but the family signed  him out of the facility because they did not like the care he was receiving there. Pt is noted to be confused today. He has been talking out of his head and has not been making sense per family. Patient was noticed to have blood in his catheter and reduce urine output.They report that they are not comfortable with and cannot take him home.When I saw pt in ED, patient is disoriented, does not talk to me. Not sure whether patient has any pain anywhere. He moves all extremities. No cough or diarrhea observed. Dwelling catheter was changed in ED.  In ED, patient was found to have positive urinalysis with moderate leukocytes, WBC 8.2, temperature normal, lactate 1.43--> 2.13, tachycardia, tachypnea. CT head negative for acute abnormalities. CXR showed patchy opacities at the left greater than right lung bases appear stable, favor atelectasis, though cannot exclude a component of aspiration or pneumonia. Patient is admitted to inpatient for further eval and treatment.  Hospital Course:   Somnolence  Was stuporous on admission, Suspicious this was from seizure activity due to inconsistent oral meds intake, keppra level borderline low on admission. Patient also has history of sleeping for days with light sedation No evidence of UTI, patient was fully alert and got lethargic after menstruation of 0.5 mg of Haldol. Very difficult situation as patient not improving, technically has failure to thrive. Family met the palliative care team and they still wanted to continue "everything". I discontinued Hald, Ativan, Bactrim and Flomax (patient already on Proscar) He is hypersensitive to sedatives, discontinued Ativan and Haldol, continue only Seroquel 25 mg daily at bedtime.   Recent UTI Patient has recent complicated  UTI secondary to indwelling Foley catheter. Patient treated with Ceftin and fluconazole for 5 days, upon admission to the hospital this time urinalysis did not show pus cells. Anyway patient  treated with Bactrim for 4 days.  Hypomagnesemia:  This is repleted with supplements.  H/o Seizure:  -keep on iv keppra for now -keppra level borderline low at 9.5, absorption issues vs noncompliant. No seizure activity in the hospital, repeat Keppra level in a.m.  CAD: s/p CABG 2002. Dose not seem to have CP. -continue aspirin, metoprolol,  Depression and anxiety: -Continue Seroquel and Ativan when necessary  History of stroke -On aspirin  Combined systolic and diastolic congestive heart failure: 2-D echo on 02/09/15 showed EF of 45-50 percent discrete 1 diastolic dysfunction. Patient is not on diuretics at home. No leg edema. CHF is compensated. -Continue aspirin and metoprolol  BPH /recent CT ab showed massive prostatomegaly:  - Continue Flomax and proscar, chronic foley -psa significantly elevated at 24 -consider urology referral if family chooses aggressive measures.  Dementia: likely vascular dementia with prior h/o cva, also extremity hard of hearing.  FTT with advanced age, recent frequent hospitalization. palliative care consulted for code status discussion and goal of care. Very difficult family dynamics, may need ethic committee involvement.   Procedures:  None  Consultations:  none Discharge Exam: Filed Vitals:   05/06/15 2143 05/07/15 0518  BP: 124/55 116/53  Pulse: 57 56  Temp: 97.5 F (36.4 C) 97.4 F (36.3 C)  Resp: 16 18   General: Alert and awake, very hard of hearing HEENT: anicteric sclera, pupils reactive to light and accommodation, EOMI CVS: S1-S2 clear, no murmur rubs or gallops Chest: clear to auscultation bilaterally, no wheezing, rales or rhonchi Abdomen: soft nontender, nondistended, normal bowel sounds, no organomegaly Extremities: no cyanosis, clubbing or edema noted bilaterally Neuro: Cranial nerves II-XII intact, no focal neurological deficits  Discharge Instructions   Discharge Instructions    Diet - low sodium heart healthy     Complete by:  As directed      Increase activity slowly    Complete by:  As directed           Current Discharge Medication List    CONTINUE these medications which have NOT CHANGED   Details  aspirin EC 81 MG tablet Take 1 tablet (81 mg total) by mouth daily.    finasteride (PROSCAR) 5 MG tablet Take 1 tablet (5 mg total) by mouth daily.    levETIRAcetam (KEPPRA) 500 MG tablet Take 1 tablet (500 mg total) by mouth every 12 (twelve) hours. Qty: 30 tablet, Refills: 0    metoprolol succinate (TOPROL-XL) 25 MG 24 hr tablet Take 1 tablet (25 mg total) by mouth daily.    polyethylene glycol (MIRALAX / GLYCOLAX) packet Take 17 g by mouth daily. Qty: 14 each, Refills: 0    QUEtiapine (SEROQUEL) 25 MG tablet Take 1 tablet (25 mg total) by mouth at bedtime.    triamcinolone cream (KENALOG) 0.1 % Apply 1 application topically 2 (two) times daily.    senna-docusate (SENOKOT-S) 8.6-50 MG tablet Take 2 tablets by mouth 2 (two) times daily. Qty: 30 tablet, Refills: 0      STOP taking these medications     cefUROXime (CEFTIN) 500 MG tablet      fluconazole (DIFLUCAN) 100 MG tablet      LORazepam (ATIVAN) 0.5 MG tablet      tamsulosin (FLOMAX) 0.4 MG CAPS capsule      traMADol (ULTRAM) 50 MG tablet  Allergies  Allergen Reactions  . Benadryl [Diphenhydramine Hcl]     "talks out of his head"  . Oxycodone     Feels crazy  . Tylenol [Acetaminophen]     unknown   Follow-up Information    Follow up with [facility] .   Specialties:  [place], [place]   Contact information:   [address] [place] [phone]       The results of significant diagnostics from this hospitalization (including imaging, microbiology, ancillary and laboratory) are listed below for reference.    Significant Diagnostic Studies: Dg Chest 2 View  05/02/2015  CLINICAL DATA:  Mental status change EXAM:  CHEST  2 VIEW COMPARISON:  04/26/2015 chest radiograph. FINDINGS: Median sternotomy wires are aligned and intact. CABG clips overlie the mediastinum. Stable cardiomediastinal silhouette with mild cardiomegaly. No pneumothorax. Stable small left pleural effusion. Low lung volumes. No overt pulmonary edema. Patchy opacity at the left greater than right lung bases appears stable. IMPRESSION: 1. Low lung volumes. Patchy opacities at the left greater than right lung bases appear stable, favor atelectasis, cannot exclude a component of aspiration or pneumonia. 2. Stable small left pleural effusion. 3. Stable mild cardiomegaly without overt pulmonary edema . Electronically Signed   By: Ilona Sorrel M.D.   On: 05/02/2015 18:58   Dg Chest 2 View  04/26/2015  CLINICAL DATA:  Abdominal distention EXAM: CHEST  2 VIEW COMPARISON:  04/06/2015 chest radiograph. FINDINGS: Median sternotomy wires are aligned and intact. Coronary artery stent overlies the left heart. CABG clips are seen throughout the mediastinum. Low lung volumes. Stable cardiomediastinal silhouette with mild cardiomegaly. No pneumothorax. Increased small left pleural effusion. No right pleural effusion. No overt pulmonary edema. Patchy opacities in both lower lobes, slightly increased. IMPRESSION: 1. Stable mild cardiomegaly without overt pulmonary edema. 2. Increased small left pleural effusion. 3. Low lung volumes. Mild patchy bibasilar lung opacities, slightly increased, favor atelectasis. Electronically Signed   By: Ilona Sorrel M.D.   On: 04/26/2015 09:58   Dg Abd 1 View  04/26/2015  CLINICAL DATA:  Abdominal distention EXAM: ABDOMEN - 1 VIEW COMPARISON:  04/11/2015 abdominal radiograph FINDINGS: No disproportionately dilated small bowel loops. Mild to moderate stool throughout the visualized colon. No evidence of pneumatosis or pneumoperitoneum. Mild degenerative changes in the lumbar spine. No pathologic soft tissue calcifications. IMPRESSION:  Nonobstructive bowel gas pattern.  Mild-to-moderate colonic stool. Electronically Signed   By: Ilona Sorrel M.D.   On: 04/26/2015 09:51   Ct Head Wo Contrast  05/02/2015  CLINICAL DATA:  Altered mental status. EXAM: CT HEAD WITHOUT CONTRAST TECHNIQUE: Contiguous axial images were obtained from the base of the skull through the vertex without intravenous contrast. COMPARISON:  04/08/2015 head CT. FINDINGS: No evidence of parenchymal hemorrhage or extra-axial fluid collection. No mass lesion, mass effect, or midline shift. No CT evidence of acute infarction. Intracranial atherosclerosis. Nonspecific subcortical and periventricular white matter hypodensity, most in keeping with chronic small vessel ischemic change. Stable lacunar infarcts in the left thalamus and right internal capsule. Stable encephalomalacia in the right temporal lobe with ex vacuo dilatation of the temporal horn of the right lateral ventricle. No acute ventriculomegaly. Stable mucous retention cyst versus polyp in the right maxillary sinus. The mastoid air cells are unopacified. No evidence of calvarial fracture. IMPRESSION: 1.  No evidence of acute intracranial abnormality. 2. Intracranial atherosclerosis, old right internal capsule and left thalamic lacunes, right temporal lobe encephalomalacia and chronic  small vessel ischemic white matter change. 3. Stable mucous retention cyst versus polyp in the right maxillary sinus. Electronically Signed   By: Ilona Sorrel M.D.   On: 05/02/2015 18:48   Ct Head Wo Contrast  04/08/2015  CLINICAL DATA:  Acute onset confusion and altered mental status EXAM: CT HEAD WITHOUT CONTRAST TECHNIQUE: Contiguous axial images were obtained from the base of the skull through the vertex without intravenous contrast. COMPARISON:  April 06, 2015 FINDINGS: There is mild generalized atrophy 3 there is no intracranial mass, hemorrhage, extra-axial fluid collection, or midline shift. Patchy small vessel disease in the  centra semiovale bilaterally is stable. There is a prior lacunar infarct in the left thalamus, stable. There is a prior small lacunar infarct at the genu of the right internal capsule, stable. No acute infarct evident. Bony calvarium appears intact. The mastoid air cells are clear. There is a stable retention cyst in right maxillary antrum. IMPRESSION: Stable atrophy with periventricular small vessel disease. Prior small infarcts in the left thalamus and right internal capsule are stable. No acute infarct evident. No mass or hemorrhage. Stable retention cyst in right maxillary antrum. Electronically Signed   By: Lowella Grip III M.D.   On: 04/08/2015 15:13   Dg Abd 2 Views  04/11/2015  CLINICAL DATA:  Abdominal pain EXAM: ABDOMEN - 2 VIEW COMPARISON:  CT abdomen and pelvis 04/03/2015 FINDINGS: Retained barium in distal colon and rectum. Sigmoid and descending colonic diverticulosis. Nonobstructive bowel gas pattern. No bowel dilatation, bowel wall thickening or free intraperitoneal air. Diffuse osseous demineralization with compression fractures of T12 and L2. Degenerative disc and facet disease changes lumbar spine. Atherosclerotic calcifications including at a segment of aneurysmal dilatation of the distal abdominal aorta, better visualized and measured on prior CT. Questionable tiny nonobstructing inferior pole RIGHT renal calculus. IMPRESSION: No evidence of bowel obstruction or perforation. Distal colonic diverticulosis. Abdominal aortic aneurysm, please refer to recent CT abdomen/pelvis exam. Compression fractures of T12 and L2. Question tiny nonobstructing RIGHT renal calculus. Electronically Signed   By: Lavonia Dana M.D.   On: 04/11/2015 09:49    Microbiology: Recent Results (from the past 240 hour(s))  Urine culture     Status: None   Collection Time: 05/03/15  1:08 AM  Result Value Ref Range Status   Specimen Description URINE, CATHETERIZED  Final   Special Requests NONE  Final   Culture    Final    NO GROWTH 2 DAYS Performed at [facility]    Report Status 05/05/2015 FINAL  Final  Culture, blood (routine x 2)     Status: None (Preliminary result)   Collection Time: 05/03/15  1:08 AM  Result Value Ref Range Status   Specimen Description BLOOD BLOOD RIGHT FOREARM  Final   Special Requests IN PEDIATRIC BOTTLE 3CC  Final   Culture   Final    NO GROWTH 3 DAYS Performed at [facility]    Report Status PENDING  Incomplete  Culture, blood (routine x 2)     Status: None (Preliminary result)   Collection Time: 05/03/15  1:12 AM  Result Value Ref Range Status   Specimen Description BLOOD RIGHT ANTECUBITAL  Final   Special Requests IN PEDIATRIC BOTTLE 3CC  Final   Culture   Final    NO GROWTH 3 DAYS Performed at [facility]    Report Status PENDING  Incomplete  MRSA PCR Screening     Status: None   Collection Time: 05/03/15  6:12  AM  Result Value Ref Range Status   MRSA by PCR NEGATIVE NEGATIVE Final    Comment:        The GeneXpert MRSA Assay (FDA approved for NASAL specimens only), is one component of a comprehensive MRSA colonization surveillance program. It is not intended to diagnose MRSA infection nor to guide or monitor treatment for MRSA infections.      Labs: Basic Metabolic Panel:  Recent Labs Lab 05/03/15 0415 05/04/15 0750 05/05/15 0645 05/06/15 0558 05/07/15 0522  NA 140 141 139 134* 137  K 3.5 3.6 4.1 5.0 4.2  CL 107 111 110 104 107  CO2 _0 GLUCOSE 107* 84 80 77 74  BUN _1 CREATININE 1.04 0.90 1.14 1.18 1.20  CALCIUM 8.1* 8.1* 8.2* 8.3* 8.4*  MG  --  1.6* 2.0 1.9 2.0   Liver Function Tests:  Recent Labs Lab 05/03/15 0415  AST 16  ALT 24  ALKPHOS 105  BILITOT 0.8  PROT 5.7*  ALBUMIN 2.2*   No results for input(s): LIPASE, AMYLASE in the last 168 hours.  Recent Labs Lab 05/04/15 0750  AMMONIA 10   CBC:  Recent Labs Lab 05/02/15 1724 05/03/15 0415 05/06/15 0558  05/07/15 0522  WBC 8.2 6.6 8.0 5.7  NEUTROABS 5.1  --   --   --   HGB 11.4* 10.2* 9.7* 9.8*  HCT 35.6* 31.3* 29.0* 29.8*  MCV 96.5 97.2 95.1 94.9  PLT 246 223 238 255   Cardiac Enzymes: No results for input(s): CKTOTAL, CKMB, CKMBINDEX, TROPONINI in the last 168 hours. BNP: BNP (last 3 results)  Recent Labs  02/07/15 2154 05/03/15 0415 05/06/15 0558  BNP 48.2 361.5* 831.7*    ProBNP (last 3 results) No results for input(s): PROBNP in the last 8760 hours.  CBG:  Recent Labs Lab 05/05/15 0809 05/05/15 0853 05/06/15 0809 05/06/15 0836 05/07/15 0734  GLUCAP 68 82 65 127* 72       Signed:  Cherri Yera A  [facility] 05/07/2015, 12:22 PM

## 2015-05-07 NOTE — Clinical Social Work Placement (Signed)
   CLINICAL SOCIAL WORK PLACEMENT  NOTE  Date:  05/07/2015  Patient Details  Name: Larry Bradford MRN: [id number] Date of Birth: 12-Nov-1916  Clinical Social Work is seeking post-discharge placement for this patient at the [place] level of care (*CSW will initial, date and re-position this form in  chart as items are completed):  Yes   Patient/family provided with [place] Work Department's list of facilities offering this level of care within the geographic area requested by the patient (or if unable, by the patient's family).  Yes   Patient/family informed of their freedom to choose among providers that offer the needed level of care, that participate in Medicare, Medicaid or managed care program needed by the patient, have an available bed and are willing to accept the patient.  Yes   Patient/family informed of [place]'s ownership interest in [facility] and [facility], as well as of the fact that they are under no obligation to receive care at these facilities.  PASRR submitted to EDS on       PASRR number received on       Existing PASRR number confirmed on 05/05/15     FL2 transmitted to all facilities in geographic area requested by pt/family on 05/05/15     FL2 transmitted to all facilities within larger geographic area on       Patient informed that his/her managed care company has contracts with or will negotiate with certain facilities, including the following:            Patient/family informed of bed offers received.  Patient chooses bed at       Physician recommends and patient chooses bed at  [facility])    Patient to be transferred to   on 05/07/15.  Patient to be transferred to facility by       Patient family notified on 05/07/15 of transfer.  Name of family member notified:        PHYSICIAN       Additional Comment:    _______________________________________________ Ludwig Clarks,  LCSW 05/07/2015, 1:42 PM

## 2015-05-07 NOTE — Progress Notes (Signed)
Report called to [facility] at [facility].  Wife, [name], made aware of patient being discharged today. Will continue to monitor until discharge.

## 2015-05-07 NOTE — Progress Notes (Signed)
SNF bed selected at [facility]- family pleased and will assist with paperwork. Dr. Hartford Poli aware and will complete dc for transfer.    Eduard Clos, MSW, [place]

## 2015-05-08 LAB — CULTURE, BLOOD (ROUTINE X 2)
Culture: NO GROWTH
Culture: NO GROWTH

## 2015-06-13 ENCOUNTER — Emergency Department (HOSPITAL_COMMUNITY)
Admission: EM | Admit: 2015-06-13 | Discharge: 2015-06-14 | Disposition: A | Discharge status: Home / Self Care | Payer: Medicare Other | Attending: Emergency Medicine | Admitting: Emergency Medicine

## 2015-06-13 ENCOUNTER — Encounter (HOSPITAL_COMMUNITY): Payer: Self-pay | Admitting: Oncology

## 2015-06-13 DIAGNOSIS — N390 Urinary tract infection, site not specified: Secondary | ICD-10-CM | POA: Insufficient documentation

## 2015-06-13 DIAGNOSIS — F0390 Unspecified dementia without behavioral disturbance: Secondary | ICD-10-CM | POA: Diagnosis not present

## 2015-06-13 DIAGNOSIS — I2510 Atherosclerotic heart disease of native coronary artery without angina pectoris: Secondary | ICD-10-CM | POA: Insufficient documentation

## 2015-06-13 DIAGNOSIS — I10 Essential (primary) hypertension: Secondary | ICD-10-CM | POA: Insufficient documentation

## 2015-06-13 DIAGNOSIS — Z9889 Other specified postprocedural states: Secondary | ICD-10-CM | POA: Insufficient documentation

## 2015-06-13 DIAGNOSIS — T83511A Infection and inflammatory reaction due to indwelling urethral catheter, initial encounter: Secondary | ICD-10-CM | POA: Insufficient documentation

## 2015-06-13 DIAGNOSIS — Z7982 Long term (current) use of aspirin: Secondary | ICD-10-CM | POA: Diagnosis not present

## 2015-06-13 DIAGNOSIS — Y802 Prosthetic and other implants, materials and accessory physical medicine devices associated with adverse incidents: Secondary | ICD-10-CM | POA: Diagnosis not present

## 2015-06-13 DIAGNOSIS — Z8673 Personal history of transient ischemic attack (TIA), and cerebral infarction without residual deficits: Secondary | ICD-10-CM | POA: Insufficient documentation

## 2015-06-13 DIAGNOSIS — H269 Unspecified cataract: Secondary | ICD-10-CM | POA: Diagnosis not present

## 2015-06-13 DIAGNOSIS — Z8719 Personal history of other diseases of the digestive system: Secondary | ICD-10-CM | POA: Diagnosis not present

## 2015-06-13 DIAGNOSIS — F329 Major depressive disorder, single episode, unspecified: Secondary | ICD-10-CM | POA: Diagnosis not present

## 2015-06-13 DIAGNOSIS — Z951 Presence of aortocoronary bypass graft: Secondary | ICD-10-CM | POA: Diagnosis not present

## 2015-06-13 DIAGNOSIS — H9190 Unspecified hearing loss, unspecified ear: Secondary | ICD-10-CM | POA: Insufficient documentation

## 2015-06-13 DIAGNOSIS — Z8639 Personal history of other endocrine, nutritional and metabolic disease: Secondary | ICD-10-CM | POA: Diagnosis not present

## 2015-06-13 DIAGNOSIS — N400 Enlarged prostate without lower urinary tract symptoms: Secondary | ICD-10-CM | POA: Diagnosis not present

## 2015-06-13 DIAGNOSIS — N139 Obstructive and reflux uropathy, unspecified: Secondary | ICD-10-CM | POA: Diagnosis present

## 2015-06-13 LAB — CBC WITH DIFFERENTIAL/PLATELET
BASOS ABS: 0 10*3/uL (ref 0.0–0.1)
Basophils Relative: 0 %
EOS PCT: 3 %
Eosinophils Absolute: 0.4 10*3/uL (ref 0.0–0.7)
HEMATOCRIT: 34.8 % — AB (ref 39.0–52.0)
Hemoglobin: 11.3 g/dL — ABNORMAL LOW (ref 13.0–17.0)
LYMPHS ABS: 1.3 10*3/uL (ref 0.7–4.0)
LYMPHS PCT: 13 %
MCH: 30.1 pg (ref 26.0–34.0)
MCHC: 32.5 g/dL (ref 30.0–36.0)
MCV: 92.8 fL (ref 78.0–100.0)
Monocytes Absolute: 0.6 10*3/uL (ref 0.1–1.0)
Monocytes Relative: 6 %
NEUTROS ABS: 8.2 10*3/uL — AB (ref 1.7–7.7)
Neutrophils Relative %: 78 %
Platelets: 233 10*3/uL (ref 150–400)
RBC: 3.75 MIL/uL — AB (ref 4.22–5.81)
RDW: 13.6 % (ref 11.5–15.5)
WBC: 10.5 10*3/uL (ref 4.0–10.5)

## 2015-06-13 LAB — BASIC METABOLIC PANEL
ANION GAP: 8 (ref 5–15)
BUN: 15 mg/dL (ref 6–20)
CHLORIDE: 108 mmol/L (ref 101–111)
CO2: 27 mmol/L (ref 22–32)
Calcium: 9.3 mg/dL (ref 8.9–10.3)
Creatinine, Ser: 1.12 mg/dL (ref 0.61–1.24)
GFR calc Af Amer: >60 mL/min (ref >60)
GFR calc non Af Amer: 53 mL/min — ABNORMAL LOW (ref >60)
GLUCOSE: 137 mg/dL — AB (ref 65–99)
POTASSIUM: 3.8 mmol/L (ref 3.5–5.1)
Sodium: 143 mmol/L (ref 135–145)

## 2015-06-13 LAB — URINE MICROSCOPIC-ADD ON

## 2015-06-13 LAB — URINALYSIS, ROUTINE W REFLEX MICROSCOPIC
Bilirubin Urine: NEGATIVE
GLUCOSE, UA: NEGATIVE mg/dL
Ketones, ur: NEGATIVE mg/dL
Nitrite: NEGATIVE
PROTEIN: 30 mg/dL — AB
SPECIFIC GRAVITY, URINE: 1.026 (ref 1.005–1.030)
pH: 6 (ref 5.0–8.0)

## 2015-06-13 MED ORDER — LIDOCAINE HCL 2 % EX GEL
1.0000 "application " | Freq: Once | CUTANEOUS | Status: AC | PRN
  Administered 2015-06-13: 1 via URETHRAL
  Filled 2015-06-13: qty 11

## 2015-06-13 MED ORDER — DEXTROSE 5 % IV SOLN
1.0000 g | Freq: Once | INTRAVENOUS | Status: AC
  Administered 2015-06-13: 1 g via INTRAVENOUS
  Filled 2015-06-13: qty 10

## 2015-06-13 MED ORDER — CIPROFLOXACIN HCL 500 MG PO TABS: 500.0000 mg | ORAL_TABLET | Freq: Two times a day (BID) | ORAL | Status: AC

## 2015-06-13 NOTE — Discharge Instructions (Signed)
Catheter-Associated Urinary Tract Infection FAQs  What is "catheter-associated urinary tract infection"?  A urinary tract infection (also called "UTI") is an infection in the urinary system, which includes the bladder (which stores the urine) and the kidneys (which filter the blood to make urine). Germs (for example, bacteria or yeasts) do not normally live in these areas; but if germs are introduced, an infection can occur.  If you have a urinary catheter, germs can travel along the catheter and cause an infection in your bladder or your kidney; in that case it is called a catheter-associated urinary tract infection (or "CA-UTI").   What is a urinary catheter?  A urinary catheter is a thin tube placed in the bladder to drain urine. Urine drains through the tube into a bag that collects the urine. A urinary catheter may be used:  · If you are not able to urinate on your own  · To measure the amount of urine that you make, for example, during intensive care  · During and after some types of surgery  · During some tests of the kidneys and bladder  People with urinary catheters have a much higher chance of getting a urinary tract infection than people who don't have a catheter.  How do I get a catheter-associated urinary tract infection (CA-UTI)?  If germs enter the urinary tract, they may cause an infection. Many of the germs that cause a catheter-associated urinary tract infection are common germs found in your intestines that do not usually cause an infection there. Germs can enter the urinary tract when the catheter is being put in or while the catheter remains in the bladder.   What are the symptoms of a urinary tract infection?  Some of the common symptoms of a urinary tract infection are:  · Burning or pain in the lower abdomen (that is, below the stomach)  · Fever  · Bloody urine may be a sign of infection, but is also caused by other problems  · Burning during urination or an increase in the frequency of  urination after the catheter is removed.  Sometimes people with catheter-associated urinary tract infections do not have these symptoms of infection.  Can catheter-associated urinary tract infections be treated?  Yes, most catheter-associated urinary tract infections can be treated with antibiotics and removal or change of the catheter. Your doctor will determine which antibiotic is best for you.   What are some of the things that hospitals are doing to prevent catheter-associated urinary tract infections?  To prevent urinary tract infections, doctors and nurses take the following actions.   Catheter insertion  · External catheters in men (these look like condoms and are placed over the penis rather than into the penis)  · Putting a temporary catheter in to drain the urine and removing it right away. This is called intermittent urethral catheterization.  Catheter care  What can I do to help prevent catheter-associated urinary tract infections if I have a catheter?  · Always clean your hands before and after doing catheter care.  · Always keep your urine bag below the level of your bladder.  · Do not tug or pull on the tubing.  · Do not twist or kink the catheter tubing.  · Ask your healthcare provider each day if you still need the catheter.  What do I need to do when I go home from the hospital?  · If you will be going home with a catheter, your doctor or nurse should explain everything   you need to know about taking care of the catheter. Make sure you understand how to care for it before you leave the hospital.  · If you develop any of the symptoms of a urinary tract infection, such as burning or pain in the lower abdomen, fever, or an increase in the frequency of urination, contact your doctor or nurse immediately.  · Before you go home, make sure you know who to contact if you have questions or problems after you get home.  If you have questions, please ask your doctor or nurse.  Developed and co-sponsored by The  Society for Healthcare Epidemiology of America (SHEA); Infectious Diseases Society of America (IDSA); American Hospital Association; Association for Professionals in Infection Control and Epidemiology (APIC); Centers for Disease Control and Prevention (CDC); and The Joint Commission.     This information is not intended to replace advice given to you by your health care provider. Make sure you discuss any questions you have with your health care provider.     Document Released: 02/09/2012 Document Revised: 10/01/2014 Document Reviewed: 07/31/2014  Elsevier Interactive Patient Education ©2016 Elsevier Inc.

## 2015-06-13 NOTE — ED Notes (Signed)
Patient arrives by EMS with complaint of Hospice RN unable to get foley back in. EMS states Hospice nurse removed foley and was unable to get it back in.  Patient here for foley placement.

## 2015-06-13 NOTE — ED Notes (Signed)
Bed: [id number] Expected date:  Expected time:  Means of arrival:  Comments: EMS 82 yo male/hospice nurse unable to insert foley

## 2015-06-13 NOTE — Progress Notes (Signed)
CSW attempted to meet with patient at bedside. However, the patient was asleep. There was no family present.   CSW reached out to patient's wife/ [name] at [phone]. Wife states that patient has home health with [facility]. She states when home health is not present she assist patient with completing ADL's.   CSW asked wife if she would be interested in facility. Wife declined.  Willette Brace [id number] ED CSW 06/13/2015 11:11 PM

## 2015-06-13 NOTE — ED Provider Notes (Signed)
CSN: [id number]     Arrival date & time 06/13/15  2043 History   First MD Initiated Contact with Patient 06/13/15 2051     Chief Complaint  Patient presents with  . urinary obstruction      HPI Patient arrives by EMS with complaint of Hospice RN unable to get foley back in. EMS states Hospice nurse removed foley and was unable to get it back in. Patient here for foley placement. Past Medical History  Diagnosis Date  . Seizures ([place])     Onset in Sept 2006  . Cataract   . Hard of hearing   . Renal disorder   . Depression   . HTN (hypertension)   . BPH (benign prostatic hyperplasia)   . CAD (coronary artery disease)     s/p CABG in 2002  . Hyperlipidemia   . OSA (obstructive sleep apnea)   . Aortic aneurysm ([place])     3 CM in 2007  . Carotid stenosis   . DDD (degenerative disc disease)   . Trigeminal neuralgia   . Lower GI bleed   . Stroke ([place])   . Allergy   . Aortic stenosis    Past Surgical History  Procedure Laterality Date  . Coronary artery bypass graft  2002  . Temporal artery biopsy / ligation  2004   Family History  Problem Relation Age of Onset  . CAD Brother   . Heart disease Brother   . Cancer Mother    Social History  Substance Use Topics  . Smoking status: Never Smoker   . Smokeless tobacco: Never Used  . Alcohol Use: No    Review of Systems  Unable to perform ROS: Dementia      Allergies  Benadryl; Oxycodone; and Tylenol  Home Medications   Prior to Admission medications   Medication Sig Start Date End Date Taking? Authorizing Provider  aspirin EC 81 MG tablet Take 1 tablet (81 mg total) by mouth daily. 02/13/15  Yes Shanker Kristeen Mans, MD  finasteride (PROSCAR) 5 MG tablet Take 1 tablet (5 mg total) by mouth daily. 02/10/15  Yes Shanker Kristeen Mans, MD  levETIRAcetam (KEPPRA) 500 MG tablet Take 1 tablet (500 mg total) by mouth every 12 (twelve) hours. 01/21/15  Yes Benjamin Cartner, PA-C  LORazepam (ATIVAN) 0.5 MG tablet Take 0.25 mg by  mouth every 4 (four) hours as needed (sob).   Yes Historical Provider, MD  metoprolol succinate (TOPROL-XL) 25 MG 24 hr tablet Take 1 tablet (25 mg total) by mouth daily. 02/10/15  Yes Shanker Kristeen Mans, MD  morphine (ROXANOL) 20 MG/ML concentrated solution Take 2.5 mg by mouth every 4 (four) hours as needed for severe pain or shortness of breath.    Yes Historical Provider, MD  oxybutynin (DITROPAN) 5 MG tablet Take 0.5 tablet by mouth twice daily as needed for bladder spasms 05/26/15  Yes Historical Provider, MD  polyethylene glycol (MIRALAX / GLYCOLAX) packet Take 17 g by mouth daily. 04/11/15  Yes Lavina Hamman, MD  QUEtiapine (SEROQUEL) 25 MG tablet Take 1 tablet (25 mg total) by mouth at bedtime. 04/29/15  Yes Verlee Monte, MD  senna-docusate (SENOKOT-S) 8.6-50 MG tablet Take 2 tablets by mouth 2 (two) times daily. 04/12/15  Yes Lavina Hamman, MD  triamcinolone cream (KENALOG) 0.1 % Apply 1 application topically 2 (two) times daily.   Yes Historical Provider, MD  Vitamin D, Ergocalciferol, (DRISDOL) 50000 units CAPS capsule Take 50,000 Units by mouth every 7 (seven) days. Saturdays.  Yes Historical Provider, MD  ciprofloxacin (CIPRO) 500 MG tablet Take 1 tablet (500 mg total) by mouth 2 (two) times daily. 06/13/15   Leonard Schwartz, MD   BP 159/71 mmHg  Pulse 82  Temp(Src) 99.6 F (37.6 C) (Rectal)  Resp 19  SpO2 97% Physical Exam  Constitutional: He is oriented to person, place, and time. He appears well-developed and well-nourished. He appears distressed.  HENT:  Head: Normocephalic and atraumatic.  Eyes: Pupils are equal, round, and reactive to light.  Neck: Normal range of motion.  Cardiovascular: Normal rate and intact distal pulses.   Pulmonary/Chest: No respiratory distress.  Abdominal: Normal appearance. He exhibits no distension. There is no tenderness. There is no rebound.  Musculoskeletal: Normal range of motion.  Neurological: He is alert and oriented to person, place, and  time. No cranial nerve deficit.  Skin: Skin is warm and dry. No rash noted.  Psychiatric: He has a normal mood and affect. His behavior is normal.  Nursing note and vitals reviewed.   ED Course  Procedures (including critical care time) Medications  cefTRIAXone (ROCEPHIN) 1 g in dextrose 5 % 50 mL IVPB (1 g Intravenous New Bag/Given 06/13/15 2249)  lidocaine (XYLOCAINE) 2 % jelly 1 application (1 application Urethral Given 06/13/15 2123)    Labs Review Labs Reviewed  URINALYSIS, ROUTINE W REFLEX MICROSCOPIC (NOT AT [facility]) - Abnormal; Notable for the following:    APPearance CLOUDY (*)    Hgb urine dipstick LARGE (*)    Protein, ur 30 (*)    Leukocytes, UA MODERATE (*)    All other components within normal limits  CBC WITH DIFFERENTIAL/PLATELET - Abnormal; Notable for the following:    RBC 3.75 (*)    Hemoglobin 11.3 (*)    HCT 34.8 (*)    Neutro Abs 8.2 (*)    All other components within normal limits  BASIC METABOLIC PANEL - Abnormal; Notable for the following:    Glucose, Bld 137 (*)    GFR calc non Af Amer 53 (*)    All other components within normal limits  URINE MICROSCOPIC-ADD ON - Abnormal; Notable for the following:    Squamous Epithelial / LPF 0-5 (*)    Bacteria, UA FEW (*)    All other components within normal limits    Foley replaced.  Urine taken from new Foley appears to show UTI.  Patient given antibiotics since and urine cultured.    MDM   Final diagnoses:  UTI (lower urinary tract infection), secondary to chronic indwelling Foley catheter        Leonard Schwartz, MD 06/18/15 2210

## 2015-06-14 NOTE — ED Notes (Signed)
Bed: [facility] Expected date:  Expected time:  Means of arrival:  Comments: 

## 2015-06-14 NOTE — ED Notes (Signed)
Per Dr. Audie Pinto pt is to go home w/ foley.  Leg bag and peri spray provided to pt.

## 2015-06-14 NOTE — ED Notes (Signed)
Spoke w/ pt's daughter to go over d/c instructions, medications and follow up.  Pt's daughter asked that this Dentist the Hospice RN as they are the ones who acquire and provide medication.  Spoke w/ Sharyn Lull, RN from Hospice explained d/c instructions, medication prescription and follow up care.  Sharyn Lull, RN verbalized understanding.

## 2015-06-14 NOTE — ED Notes (Signed)
[facility] contacted for transport home.  

## 2015-06-15 LAB — URINE CULTURE: Special Requests: NORMAL

## 2015-07-02 DEATH — deceased

## 2017-10-01 IMAGING — CR DG CHEST 2V
2 series · 2 of 2 positions shown · non-contrast
Comparison: 04/26/2015 chest radiograph.

CLINICAL DATA: Mental status change

EXAM:
CHEST  2 VIEW

[w chest lat]
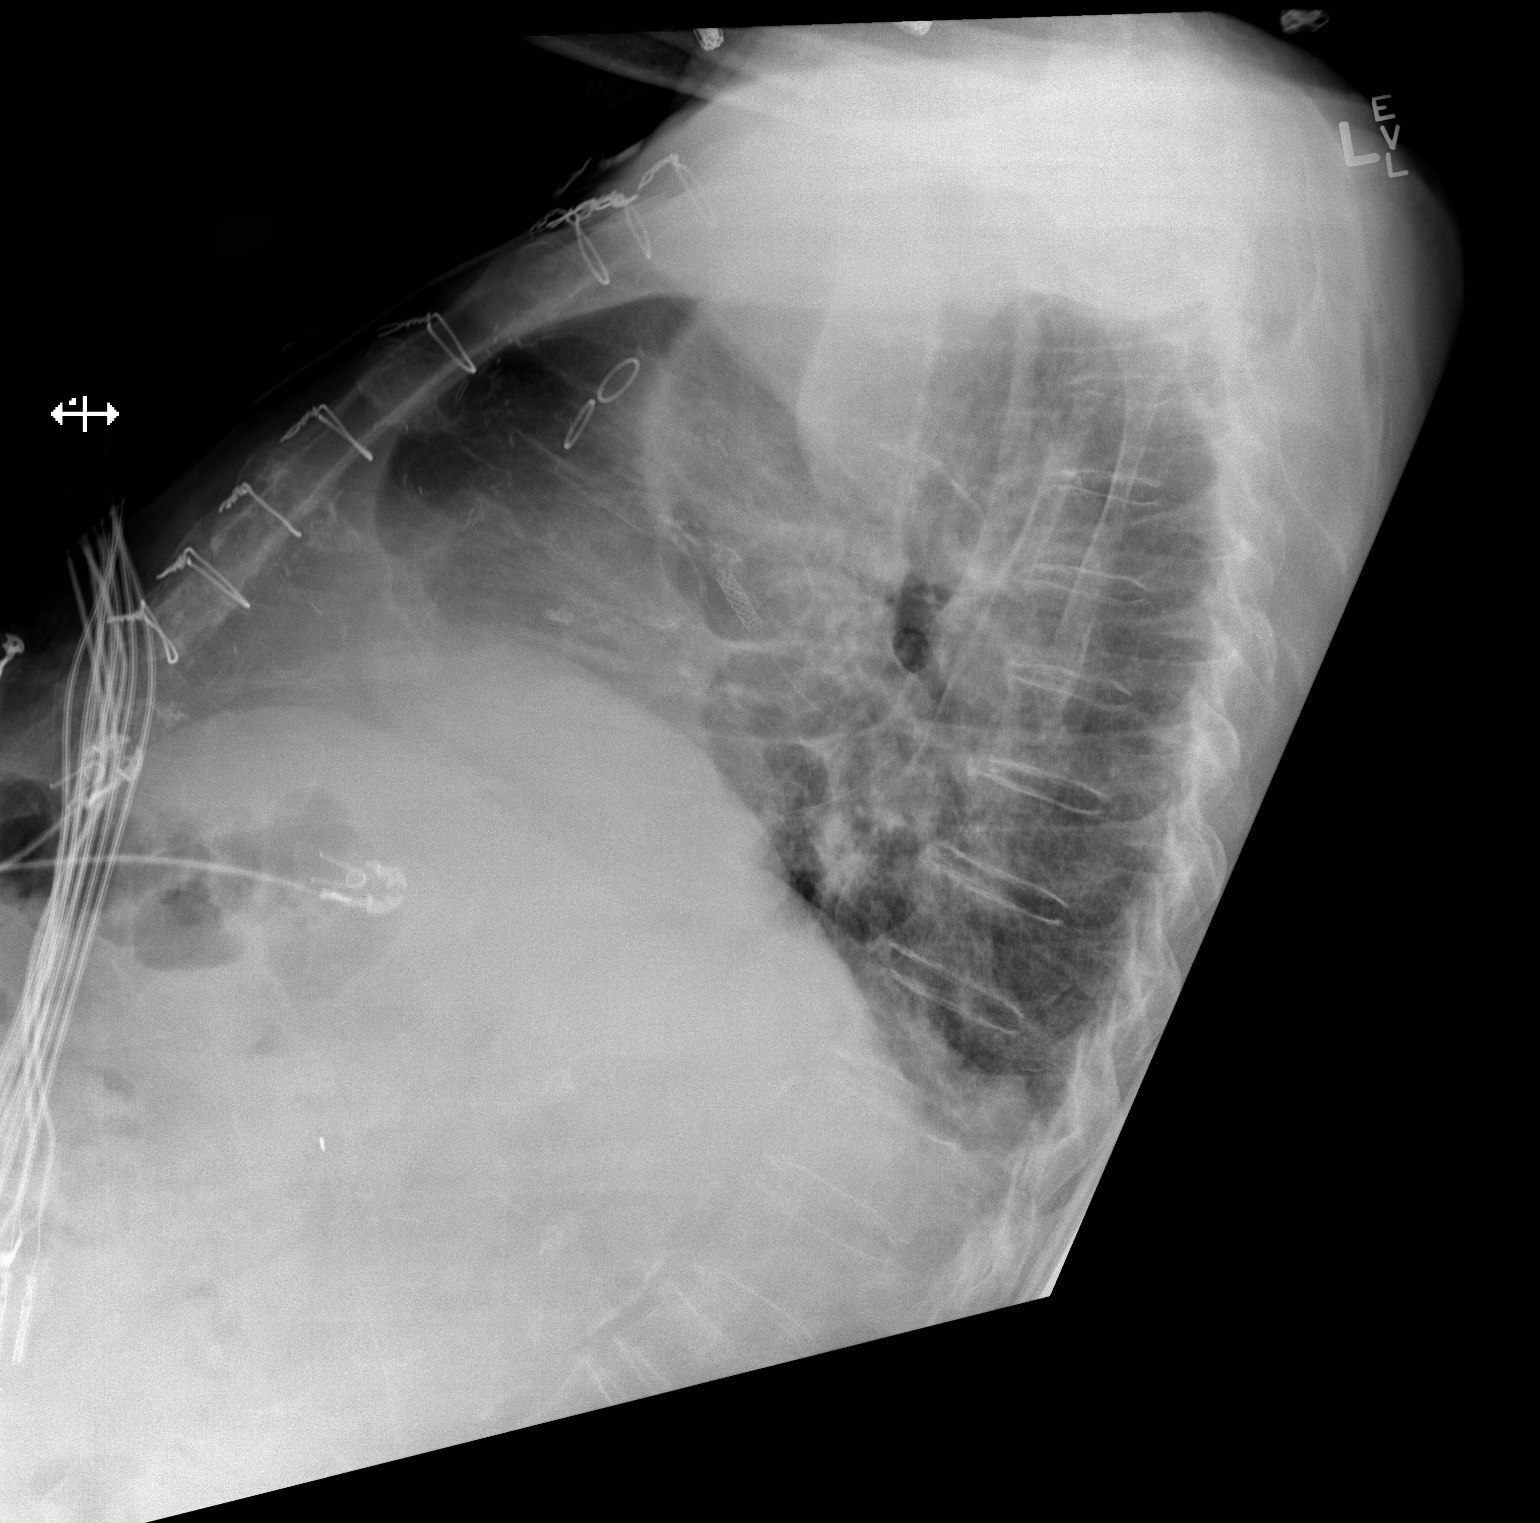

[x chest ap]
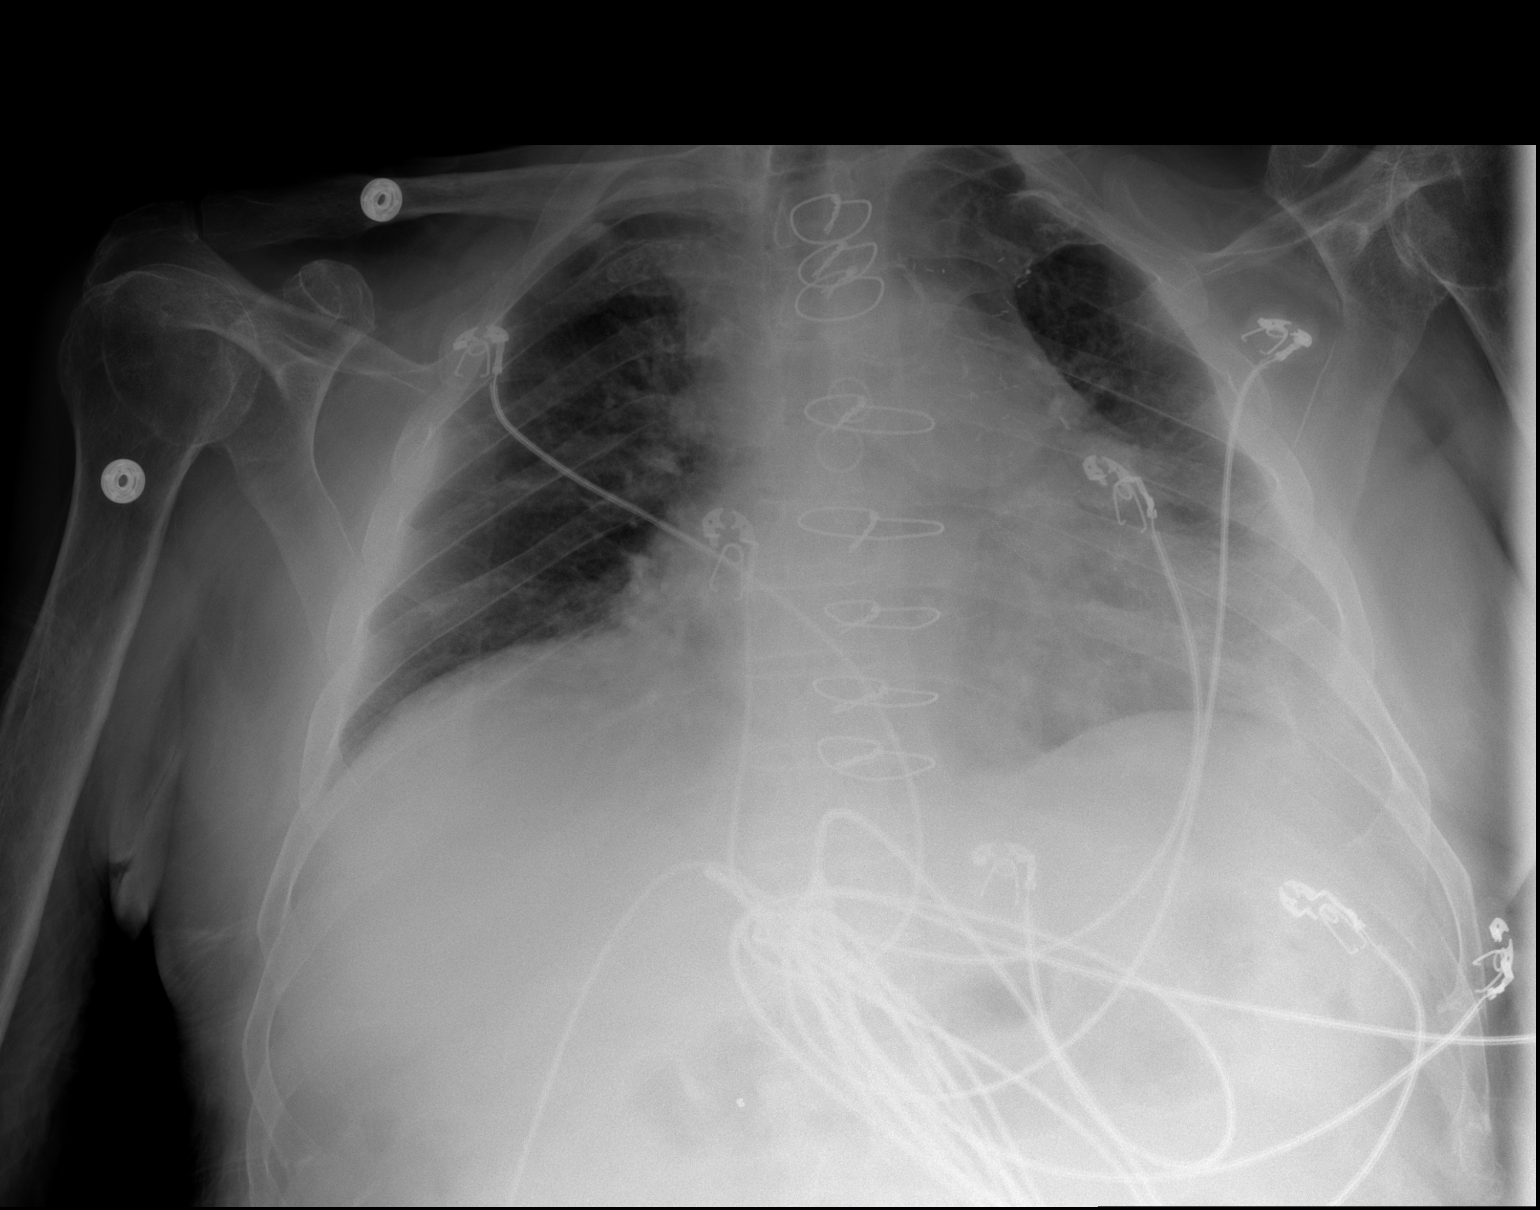

[2 of 2 positions shown; findings below may reference images not displayed]

FINDINGS: Median sternotomy wires are aligned and intact. CABG clips overlie
the mediastinum. Stable cardiomediastinal silhouette with mild
cardiomegaly. No pneumothorax. Stable small left pleural effusion.
Low lung volumes. No overt pulmonary edema. Patchy opacity at the
left greater than right lung bases appears stable.
IMPRESSION: 1. Low lung volumes. Patchy opacities at the left greater than right
lung bases appear stable, favor atelectasis, cannot exclude a
component of aspiration or pneumonia.
2. Stable small left pleural effusion.
3. Stable mild cardiomegaly without overt pulmonary edema .

## 2017-10-01 IMAGING — CT CT HEAD W/O CM
2 series · 15 of 30 positions shown, 19 images · non-contrast
Comparison: 04/08/2015 head CT.

CLINICAL DATA: Altered mental status.

EXAM:
CT HEAD WITHOUT CONTRAST
TECHNIQUE: Contiguous axial images were obtained from the base of the skull
through the vertex without intravenous contrast.

[Series 2: head w/o · axial · non-contrast · 0.44mm/px · z∈[-98,+32]mm · 13 of 32 slices shown, 17 images]
[im 3/32  brain]
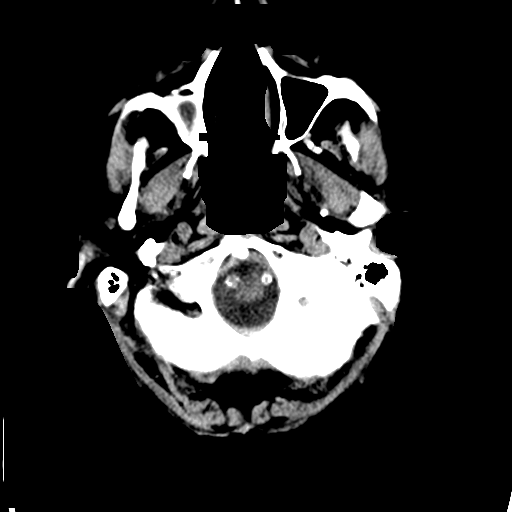
[im 3/32  bone]
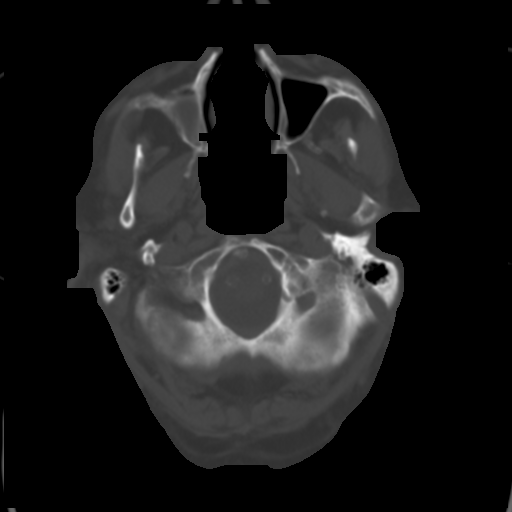
[im 5/32  brain]
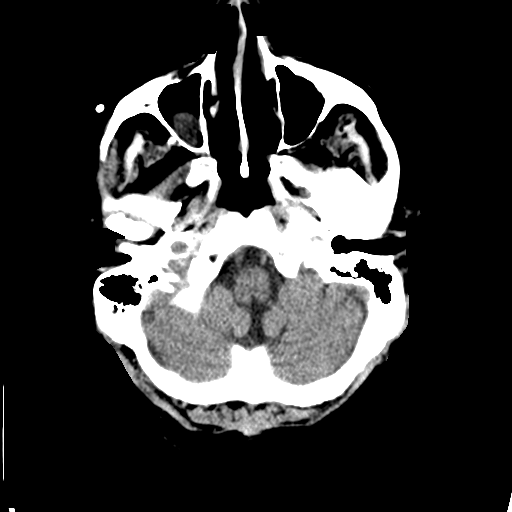
[im 7/32  brain]
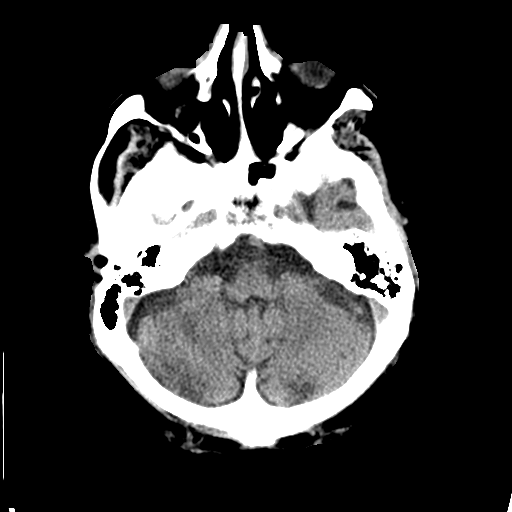
[im 9/32  brain]
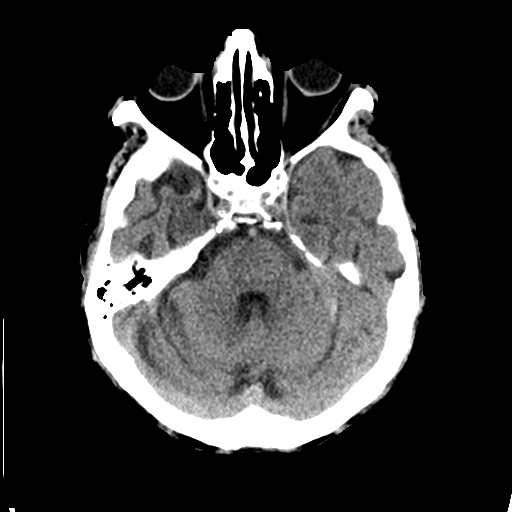
[im 12/32  brain]
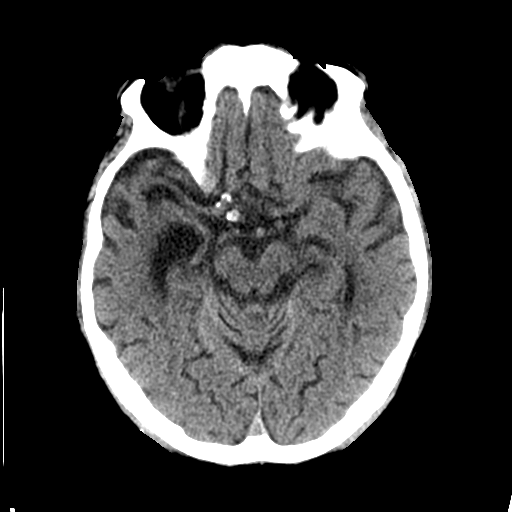
[im 12/32  bone]
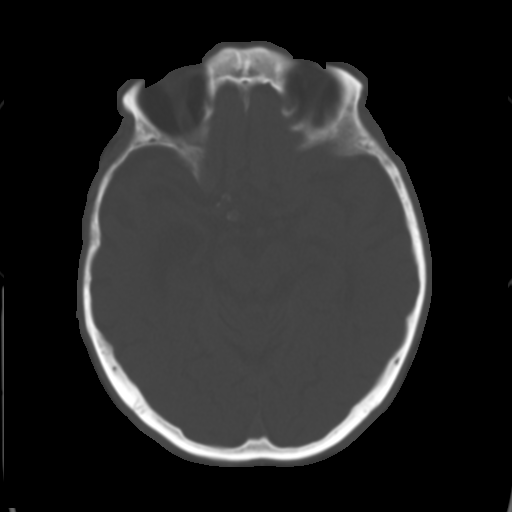
[im 14/32  brain]
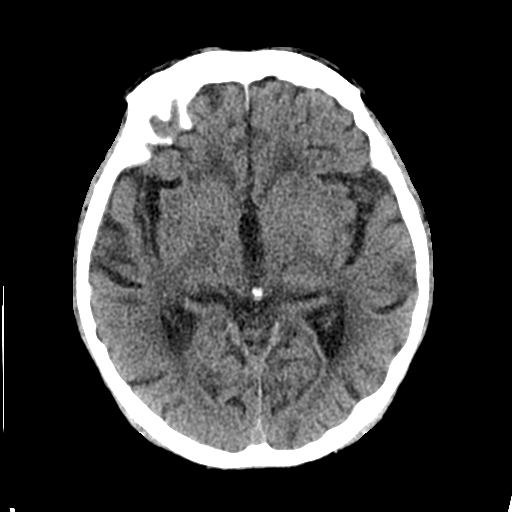
[im 16/32  brain]
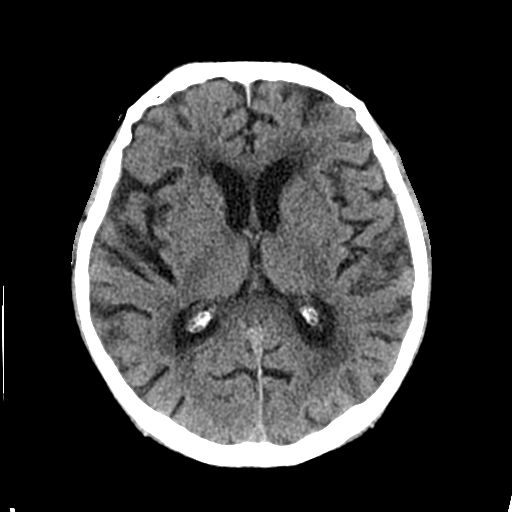
[im 18/32  brain]
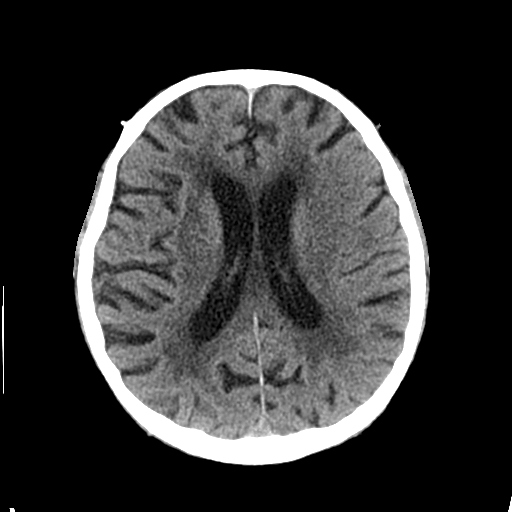
[im 20/32  brain]
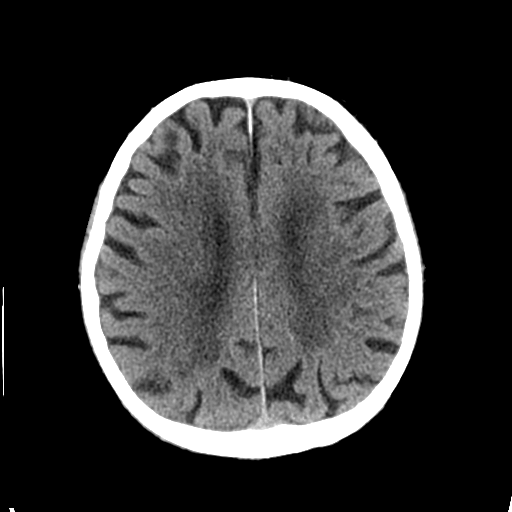
[im 20/32  bone]
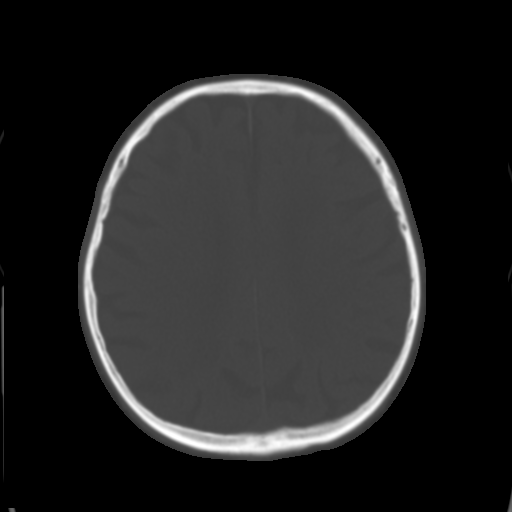
[im 23/32  brain]
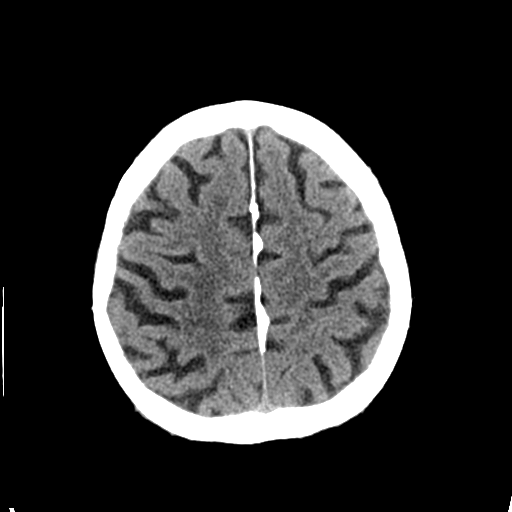
[im 25/32  brain]
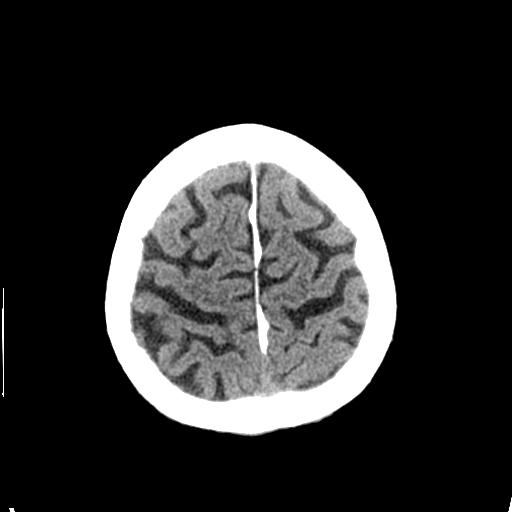
[im 27/32  brain]
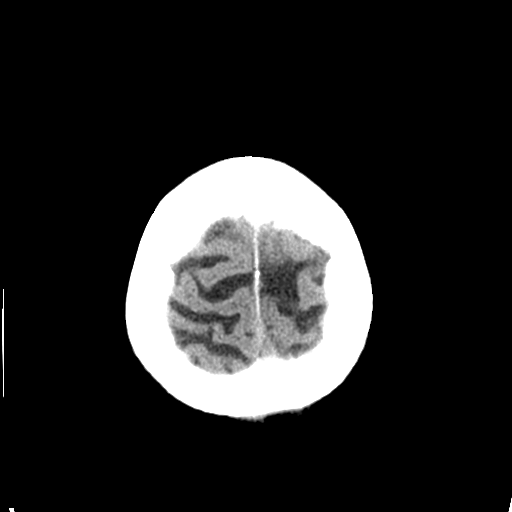
[im 29/32  brain]
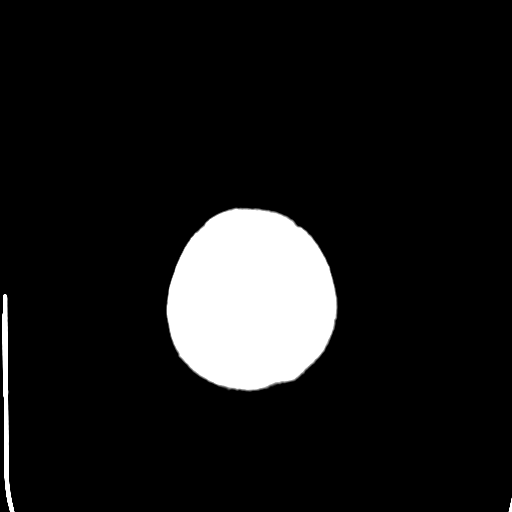
[im 29/32  bone]
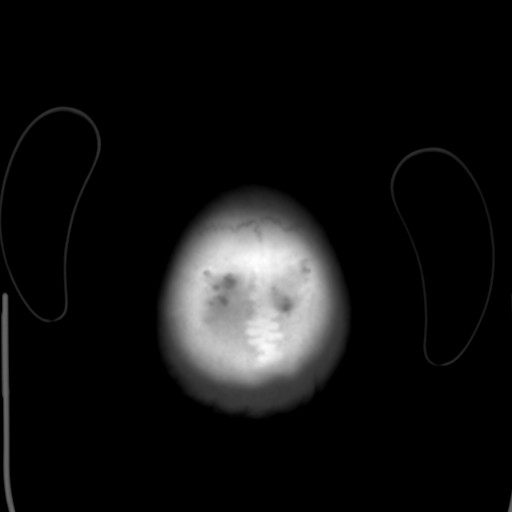

[Series 3: bone windows · axial · 0.44mm/px · z∈[-98,-78]mm · 2 of 32 slices shown]
[im 3/32  bone]
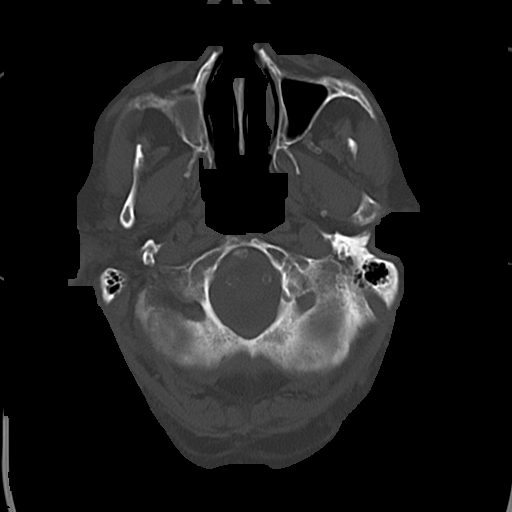
[im 7/32  bone]
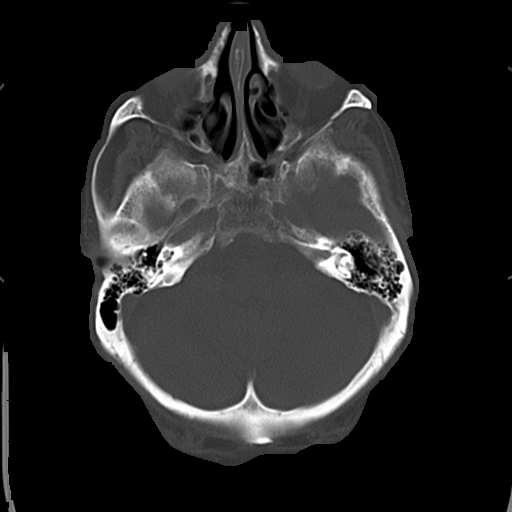

[15 of 30 positions shown; findings below may reference images not displayed]

FINDINGS: No evidence of parenchymal hemorrhage or extra-axial fluid
collection. No mass lesion, mass effect, or midline shift.

No CT evidence of acute infarction. Intracranial atherosclerosis.
Nonspecific subcortical and periventricular white matter
hypodensity, most in keeping with chronic small vessel ischemic
change.

Stable lacunar infarcts in the left thalamus and right internal
capsule. Stable encephalomalacia in the right temporal lobe with ex
vacuo dilatation of the temporal horn of the right lateral
ventricle. No acute ventriculomegaly.

Stable mucous retention cyst versus polyp in the right maxillary
sinus. The mastoid air cells are unopacified. No evidence of
calvarial fracture.
IMPRESSION: 1.  No evidence of acute intracranial abnormality.
2. Intracranial atherosclerosis, old right internal capsule and left
thalamic lacunes, right temporal lobe encephalomalacia and chronic
small vessel ischemic white matter change.
3. Stable mucous retention cyst versus polyp in the right maxillary
sinus.
